# Patient Record
Sex: Male | Born: 1950 | Race: White | Hispanic: No | Marital: Married | Smoking: Former smoker
Health system: Southern US, Community
[De-identification: ages and names within clinical notes are randomized; demographics above are authoritative.]

## PROBLEM LIST (undated history)

## (undated) DIAGNOSIS — B019 Varicella without complication: Secondary | ICD-10-CM

## (undated) DIAGNOSIS — H269 Unspecified cataract: Secondary | ICD-10-CM

## (undated) DIAGNOSIS — R7303 Prediabetes: Secondary | ICD-10-CM

## (undated) DIAGNOSIS — I739 Peripheral vascular disease, unspecified: Secondary | ICD-10-CM

## (undated) DIAGNOSIS — K219 Gastro-esophageal reflux disease without esophagitis: Secondary | ICD-10-CM

## (undated) DIAGNOSIS — R569 Unspecified convulsions: Secondary | ICD-10-CM

## (undated) DIAGNOSIS — F32A Depression, unspecified: Secondary | ICD-10-CM

## (undated) DIAGNOSIS — E785 Hyperlipidemia, unspecified: Secondary | ICD-10-CM

## (undated) HISTORY — DX: Unspecified convulsions: R56.9

## (undated) HISTORY — DX: Varicella without complication: B01.9

## (undated) HISTORY — PX: TONSILLECTOMY: SUR1361

## (undated) HISTORY — DX: Hyperlipidemia, unspecified: E78.5

## (undated) HISTORY — DX: Depression, unspecified: F32.A

## (undated) HISTORY — PX: BACK SURGERY: SHX140

## (undated) HISTORY — DX: Unspecified cataract: H26.9

## (undated) HISTORY — DX: Gastro-esophageal reflux disease without esophagitis: K21.9

---

## 1985-07-23 HISTORY — PX: LAMINECTOMY: SHX219

## 2014-11-16 ENCOUNTER — Encounter: Payer: Self-pay | Admitting: Primary Care

## 2014-11-16 ENCOUNTER — Ambulatory Visit (INDEPENDENT_AMBULATORY_CARE_PROVIDER_SITE_OTHER): Payer: Medicare Other | Admitting: Primary Care

## 2014-11-16 VITALS — BP 138/82 | HR 68 | Temp 98.3°F | Ht 74.75 in | Wt 199.8 lb

## 2014-11-16 DIAGNOSIS — L989 Disorder of the skin and subcutaneous tissue, unspecified: Secondary | ICD-10-CM | POA: Insufficient documentation

## 2014-11-16 DIAGNOSIS — K219 Gastro-esophageal reflux disease without esophagitis: Secondary | ICD-10-CM | POA: Diagnosis not present

## 2014-11-16 DIAGNOSIS — Z720 Tobacco use: Secondary | ICD-10-CM | POA: Insufficient documentation

## 2014-11-16 DIAGNOSIS — F17210 Nicotine dependence, cigarettes, uncomplicated: Secondary | ICD-10-CM

## 2014-11-16 HISTORY — DX: Disorder of the skin and subcutaneous tissue, unspecified: L98.9

## 2014-11-16 MED ORDER — CEPHALEXIN 500 MG PO CAPS: 500.0000 mg | ORAL_CAPSULE | Freq: Two times a day (BID) | ORAL | Status: DC

## 2014-11-16 NOTE — Assessment & Plan Note (Addendum)
Growth present to right parietal portion of head for 2 months. Suspicious for carcinoma, works outside in yard, long standing history of smoking. RX for Keflex BID for weeping wound. Referral made to dermatology.

## 2014-11-16 NOTE — Patient Instructions (Addendum)
Start Cephalexin antibiotic for the skin irritation present to the top of your head. Take one capsule twice daily for 7 days. Try to work on increasing the amount of fruits and vegetables in your diet. It's acceptable to continue taking Prilosec as needed with your Nexium for reflux symptoms. You will be contacted regarding your referral to dermatology. Let me know if you don't hear back in one week. Please schedule a physical with me in the next 2-3 months. You will also make a lab appointment one week prior. It was a pleasure to meet you today! Please don't hesitate to call me with any questions. Welcome to [facility]!    Food Choices for Gastroesophageal Reflux Disease When you have gastroesophageal reflux disease (GERD), the foods you eat and your eating habits are very important. Choosing the right foods can help ease the discomfort of GERD. WHAT GENERAL GUIDELINES DO I NEED TO FOLLOW?  Choose fruits, vegetables, whole grains, low-fat dairy products, and low-fat meat, fish, and poultry.  Limit fats such as oils, salad dressings, butter, nuts, and avocado.  Keep a food diary to identify foods that cause symptoms.  Avoid foods that cause reflux. These may be different for different people.  Eat frequent small meals instead of three large meals each day.  Eat your meals slowly, in a relaxed setting.  Limit fried foods.  Cook foods using methods other than frying.  Avoid drinking alcohol.  Avoid drinking large amounts of liquids with your meals.  Avoid bending over or lying down until 2-3 hours after eating. WHAT FOODS ARE NOT RECOMMENDED? The following are some foods and drinks that may worsen your symptoms: Vegetables Tomatoes. Tomato juice. Tomato and spaghetti sauce. Chili peppers. Onion and garlic. Horseradish. Fruits Oranges, grapefruit, and lemon (fruit and juice). Meats High-fat meats, fish, and poultry. This includes hot dogs, ribs, ham, sausage, salami, and  bacon. Dairy Whole milk and chocolate milk. Sour cream. Cream. Butter. Ice cream. Cream cheese.  Beverages Coffee and tea, with or without caffeine. Carbonated beverages or energy drinks. Condiments Hot sauce. Barbecue sauce.  Sweets/Desserts Chocolate and cocoa. Donuts. Peppermint and spearmint. Fats and Oils High-fat foods, including [place] fries and potato chips. Other Vinegar. Strong spices, such as black pepper, white pepper, red pepper, cayenne, curry powder, cloves, ginger, and chili powder. The items listed above may not be a complete list of foods and beverages to avoid. Contact your dietitian for more information. Document Released: 07/09/2005 Document Revised: 07/14/2013 Document Reviewed: 05/13/2013 [facility] Patient Information 2015 [place], [place]. This information is not intended to replace advice given to you by your health care provider. Make sure you discuss any questions you have with your health care provider.

## 2014-11-16 NOTE — Progress Notes (Signed)
Pre visit review using our clinic review tool, if applicable. No additional management support is needed unless otherwise documented below in the visit note. 

## 2014-11-16 NOTE — Assessment & Plan Note (Signed)
Smoker for 54 years, 1 PPD. He is not interested in quitting. Daily morning cough present, denies hemoptysis. Occasional SOB. Will continue to re-evaluate.

## 2014-11-16 NOTE — Progress Notes (Signed)
Subjective:    Patient ID: Nathaniel [name], male    DOB: October 27, 1950, 64 y.o.   MRN: [id number]  HPI  Nathaniel [name] is a 64 year old male who presents today to establish care and discuss the problems mentioned below. He's not seen a PCP in over 20 years; therefore does not have recollection of current records.  1) GERD: Symptoms of reflux have been present intermittently for about 12-15 years, and consistently for the past 6-8 months. If he doesn't have a bowel movement prior to bed then he will experience reflux of gastric contents. He's been taking Nexium 20 mg OTC for about one year. He will develop symptoms if eating spaghetti sauce and laying on his right side at night. His symptoms are mostly controlled on the Nexium and will occasionally need to take Prilosec.   2) Tobacco abuse: Cigarette smoker for 54 years, 1 PPD. He enjoys smoking and does not want to quit. He has quit 12-20 times over the years. He does report coughing every morning, denies hemoptysis.  3) Lifestyle: Diet consists of red meat, bacon, sandwiches, very few vegetables and fruit. He and his wife do prepare most of their meals.  He drinks mostly sweet tea (3/4 gallon per day), 3/4 gallon of water per day, and 8-10 cups of black coffee. He's not exercising, but stays active throughout the day.  4) Skin abnormality: New growth present to right parietal portion of head for 2 months. He admits that it has enlarged and is tender. Also dark brown mole present to right TMJ that has been there for 20+ years without changes.  Review of Systems  Constitutional: Negative for fatigue and unexpected weight change.  HENT: Negative for rhinorrhea.   Respiratory: Negative for wheezing.        Morning cough, occasional shortness of breath when walking quickly.  Cardiovascular: Negative for chest pain and leg swelling.  Gastrointestinal: Negative for diarrhea, constipation and blood in stool.  Genitourinary: Negative for dysuria,  frequency and difficulty urinating.  Musculoskeletal: Negative for myalgias and arthralgias.  Skin: Positive for color change and rash.       Started as crusty place, been there 2 months. Now enlarged, sensitive, occasionally itchy.  Allergic/Immunologic: Negative for environmental allergies.  Neurological: Negative for dizziness and headaches.  Hematological: Negative for adenopathy.  Psychiatric/Behavioral:       Denies concerns for anxiety or depression       Past Medical History  Diagnosis Date  . Chicken pox   . GERD (gastroesophageal reflux disease)   . Seizures     None since age 42    History   Social History  . Marital Status: Married    Spouse Name: N/A  . Number of Children: N/A  . Years of Education: N/A   Occupational History  . Not on file.   Social History Main Topics  . Smoking status: Current Every Day Smoker -- 1.00 packs/day    Types: Cigarettes  . Smokeless tobacco: Not on file  . Alcohol Use: No  . Drug Use: No  . Sexual Activity: Not on file   Other Topics Concern  . Not on file   Social History Narrative   Retired.   Stays busy with household work, Surveyor, mining.   Married for 3 years.   Grown children.   Enjoys reading.     Past Surgical History  Procedure Laterality Date  . Tonsillectomy      Family History  Problem Relation Age  of Onset  . Heart disease Mother     Arrthymia   . Stroke Father     Deceased  . Heart disease Brother     Myocardial infarction  . Dementia Father     Allergies  Allergen Reactions  . Prozac [Fluoxetine Hcl]     No current outpatient prescriptions on file prior to visit.   No current facility-administered medications on file prior to visit.    BP 138/82 mmHg  Pulse 68  Temp(Src) 98.3 F (36.8 C) (Oral)  Ht 6' 2.75" (1.899 m)  Wt 199 lb 12.8 oz (90.629 kg)  BMI 25.13 kg/m2  SpO2 98%    Objective:   Physical Exam  Constitutional: He is oriented to person, place, and time. He  appears well-developed.  HENT:  Right Ear: Tympanic membrane and ear canal normal.  Left Ear: Tympanic membrane and ear canal normal.  Nose: Nose normal.  Mouth/Throat: Oropharynx is clear and moist.  Eyes: Conjunctivae and EOM are normal.  Neck: Neck supple. No thyromegaly present.  Cardiovascular: Normal rate and regular rhythm.   Pulmonary/Chest: Effort normal and breath sounds normal.  Abdominal: Soft. Bowel sounds are normal. There is no tenderness.  Lymphadenopathy:    He has no cervical adenopathy.  Neurological: He is alert and oriented to person, place, and time. No cranial nerve deficit.  Skin: Skin is warm. Lesion noted. There is erythema.  1.5 cm growth present to right parietal side of cranium. Slight edema present, suspicious for carcinoma.   1.5 cm dark mole present to right TMJ.   Psychiatric: He has a normal mood and affect.          Assessment & Plan:  Tobacco cessation counseling provided to patient. Spent 5 min discussing the importance of quitting. He does not plan on quitting.

## 2014-11-16 NOTE — Assessment & Plan Note (Addendum)
Symptoms present for the past 12-15 years.  On OTC Nexium for less than one year with relief. Occasional prilosec. Handout and education provided to patient regarding triggers of GERD.

## 2014-12-14 ENCOUNTER — Encounter: Payer: Self-pay | Admitting: Primary Care

## 2014-12-14 ENCOUNTER — Encounter: Payer: Self-pay | Admitting: *Deleted

## 2014-12-14 ENCOUNTER — Ambulatory Visit (INDEPENDENT_AMBULATORY_CARE_PROVIDER_SITE_OTHER): Payer: Medicare Other | Admitting: Primary Care

## 2014-12-14 VITALS — BP 152/98 | HR 97 | Temp 98.2°F | Ht 74.5 in | Wt 200.1 lb

## 2014-12-14 DIAGNOSIS — R251 Tremor, unspecified: Secondary | ICD-10-CM

## 2014-12-14 DIAGNOSIS — M62838 Other muscle spasm: Secondary | ICD-10-CM

## 2014-12-14 DIAGNOSIS — Z79899 Other long term (current) drug therapy: Secondary | ICD-10-CM | POA: Diagnosis not present

## 2014-12-14 MED ORDER — CLONAZEPAM 0.5 MG PO TABS: ORAL_TABLET | ORAL | Status: DC

## 2014-12-14 MED ORDER — TIZANIDINE HCL 2 MG PO TABS: ORAL_TABLET | ORAL | Status: DC

## 2014-12-14 NOTE — Progress Notes (Signed)
Subjective:    Patient ID: Nathaniel [name], male    DOB: [birth year], 64 y.o.   MRN: [id number]  HPI  Mr. Mcgibbon is a 63 year old make who presents today with a chief complaint of shoulder and neck pain. He's been experiencing right shoulder and neck pain for 3 weeks since applying force to his right shoulder while plugging and changing a tire.  His pain will occasionally radiate down the back of his shoulder. He's been taking aleve once daily for pain with some relief. He describes his pain as a "deep burise" that feels like "tightness" which is worse upon movement and better with rest/laying down. Denies sharp, shooting pain down his arms or back.  2) Tremors: Present to bilateral hands with movement (not rest) and have been present since 1989. Once managed on Clonazepam 0.5 and would take 1/2 tablet once daily for 3 days. He reports this would control the tremor for 2-3 weeks and then he would restart the regimen. Denies shuffling gait, loss of balance, resting tremor. The tremor is mostly noted with fine motor usage.  Review of Systems  Constitutional: Negative for fever and chills.  Respiratory: Negative for shortness of breath.   Cardiovascular: Negative for chest pain.  Musculoskeletal: Positive for myalgias.       ROM worsens pain. Also reports tremor to bilateral hands with motion.       Past Medical History  Diagnosis Date  . Chicken pox   . GERD (gastroesophageal reflux disease)   . Seizures     None since age 36    History   Social History  . Marital Status: Married    Spouse Name: N/A  . Number of Children: N/A  . Years of Education: N/A   Occupational History  . Not on file.   Social History Main Topics  . Smoking status: Current Every Day Smoker -- 1.00 packs/day    Types: Cigarettes  . Smokeless tobacco: Not on file  . Alcohol Use: No  . Drug Use: No  . Sexual Activity: Not on file   Other Topics Concern  . Not on file   Social History  Narrative   Retired.   Stays busy with household work, Surveyor, mining.   Married for 3 years.   Grown children.   Enjoys reading.     Past Surgical History  Procedure Laterality Date  . Tonsillectomy      Family History  Problem Relation Age of Onset  . Heart disease Mother     Arrthymia   . Stroke Father     Deceased  . Heart disease Brother     Myocardial infarction  . Dementia Father     Allergies  Allergen Reactions  . Prozac [Fluoxetine Hcl]     Current Outpatient Prescriptions on File Prior to Visit  Medication Sig Dispense Refill  . cephALEXin (KEFLEX) 500 MG capsule Take 1 capsule (500 mg total) by mouth 2 (two) times daily. 14 capsule 0  . esomeprazole (NEXIUM) 20 MG capsule Take 20 mg by mouth daily at 12 noon.    Marland Kitchen ibuprofen (ADVIL,MOTRIN) 200 MG tablet Take 200 mg by mouth as needed.     No current facility-administered medications on file prior to visit.    BP 152/98 mmHg  Pulse 97  Temp(Src) 98.2 F (36.8 C) (Oral)  Ht 6' 2.5" (1.892 m)  Wt 200 lb 1.9 oz (90.774 kg)  BMI 25.36 kg/m2  SpO2 98%    Objective:  Physical Exam  Cardiovascular: Normal rate and regular rhythm.   Pulmonary/Chest: Effort normal and breath sounds normal.  Musculoskeletal:       Right shoulder: He exhibits decreased range of motion, tenderness and pain. He exhibits no swelling and no deformity.  Decreased ROM and pain with Abduction and placing arm behind back. No resting tremor noted on exam, but mostly occurs with fine motor movements per patient.  Skin: Skin is warm and dry.          Assessment & Plan:  Shoulder pain:  Present since plugging tire 3 weeks ago after forcing his right shoulder. Decreased ROM and pain to right shoulder with abduction and placing behind back. Some tenderness upon palpation to trapezius muscle. RX for Tizanidine TID PRN with drowsiness precautions. If no improvement then will consider xrays and PT.

## 2014-12-14 NOTE — Assessment & Plan Note (Signed)
Present since 1989. Once managed on Clonazepam 0.5 mg and took 1/2 tab for three days which would help reduce tremor for 2-3 weeks. RX prescribed for same dosage with same instructions. Controlled substance contract and UDS obtained. Will continue to monitor.

## 2014-12-14 NOTE — Patient Instructions (Signed)
Start Tizanidine tablets for muscle spasms to neck, back, and shoulder. You may take 1 tablet by mouth three times daily as needed for spasms. This medication may make you drowsy. Start Clonazepam as discussed for tremors. Complete controlled substance contract and urine drug screen prior to leaving. Apply heat to areas of pain. Rest until your trip. Follow up as needed.  Muscle Cramps and Spasms Muscle cramps and spasms occur when a muscle or muscles tighten and you have no control over this tightening (involuntary muscle contraction). They are a common problem and can develop in any muscle. The most common place is in the calf muscles of the leg. Both muscle cramps and muscle spasms are involuntary muscle contractions, but they also have differences:   Muscle cramps are sporadic and painful. They may last a few seconds to a quarter of an hour. Muscle cramps are often more forceful and last longer than muscle spasms.  Muscle spasms may or may not be painful. They may also last just a few seconds or much longer. CAUSES  It is uncommon for cramps or spasms to be due to a serious underlying problem. In many cases, the cause of cramps or spasms is unknown. Some common causes are:   Overexertion.   Overuse from repetitive motions (doing the same thing over and over).   Remaining in a certain position for a long period of time.   Improper preparation, form, or technique while performing a sport or activity.   Dehydration.   Injury.   Side effects of some medicines.   Abnormally low levels of the salts and ions in your blood (electrolytes), especially potassium and calcium. This could happen if you are taking water pills (diuretics) or you are pregnant.  Some underlying medical problems can make it more likely to develop cramps or spasms. These include, but are not limited to:   Diabetes.   Parkinson disease.   Hormone disorders, such as thyroid problems.   Alcohol abuse.    Diseases specific to muscles, joints, and bones.   Blood vessel disease where not enough blood is getting to the muscles.  HOME CARE INSTRUCTIONS   Stay well hydrated. Drink enough water and fluids to keep your urine clear or pale yellow.  It may be helpful to massage, stretch, and relax the affected muscle.  For tight or tense muscles, use a warm towel, heating pad, or hot shower water directed to the affected area.  If you are sore or have pain after a cramp or spasm, applying ice to the affected area may relieve discomfort.  Put ice in a plastic bag.  Place a towel between your skin and the bag.  Leave the ice on for 15-20 minutes, 03-04 times a day.  Medicines used to treat a known cause of cramps or spasms may help reduce their frequency or severity. Only take over-the-counter or prescription medicines as directed by your caregiver. SEEK MEDICAL CARE IF:  Your cramps or spasms get more severe, more frequent, or do not improve over time.  MAKE SURE YOU:   Understand these instructions.  Will watch your condition.  Will get help right away if you are not doing well or get worse. Document Released: 12/29/2001 Document Revised: 11/03/2012 Document Reviewed: 06/25/2012 [facility] Patient Information 2015 [place], [place]. This information is not intended to replace advice given to you by your health care provider. Make sure you discuss any questions you have with your health care provider.

## 2014-12-14 NOTE — Progress Notes (Signed)
Pre visit review using our clinic review tool, if applicable. No additional management support is needed unless otherwise documented below in the visit note. 

## 2014-12-16 DIAGNOSIS — D485 Neoplasm of uncertain behavior of skin: Secondary | ICD-10-CM | POA: Diagnosis not present

## 2014-12-16 DIAGNOSIS — L821 Other seborrheic keratosis: Secondary | ICD-10-CM | POA: Diagnosis not present

## 2014-12-16 DIAGNOSIS — C4441 Basal cell carcinoma of skin of scalp and neck: Secondary | ICD-10-CM | POA: Diagnosis not present

## 2014-12-16 DIAGNOSIS — D034 Melanoma in situ of scalp and neck: Secondary | ICD-10-CM | POA: Diagnosis not present

## 2015-01-03 ENCOUNTER — Other Ambulatory Visit: Payer: Self-pay | Admitting: Primary Care

## 2015-01-03 DIAGNOSIS — Z13220 Encounter for screening for lipoid disorders: Secondary | ICD-10-CM

## 2015-01-03 DIAGNOSIS — Z720 Tobacco use: Secondary | ICD-10-CM

## 2015-01-03 DIAGNOSIS — Z130 Encounter for screening for diseases of the blood and blood-forming organs and certain disorders involving the immune mechanism: Secondary | ICD-10-CM

## 2015-01-03 DIAGNOSIS — Z131 Encounter for screening for diabetes mellitus: Secondary | ICD-10-CM

## 2015-01-03 DIAGNOSIS — Z0000 Encounter for general adult medical examination without abnormal findings: Secondary | ICD-10-CM

## 2015-01-05 ENCOUNTER — Other Ambulatory Visit: Payer: Self-pay | Admitting: Primary Care

## 2015-01-05 DIAGNOSIS — Z136 Encounter for screening for cardiovascular disorders: Secondary | ICD-10-CM

## 2015-01-05 DIAGNOSIS — Z13220 Encounter for screening for lipoid disorders: Secondary | ICD-10-CM

## 2015-01-05 DIAGNOSIS — Z0000 Encounter for general adult medical examination without abnormal findings: Secondary | ICD-10-CM

## 2015-01-05 DIAGNOSIS — R030 Elevated blood-pressure reading, without diagnosis of hypertension: Secondary | ICD-10-CM

## 2015-01-05 DIAGNOSIS — Z720 Tobacco use: Secondary | ICD-10-CM

## 2015-01-05 DIAGNOSIS — Z125 Encounter for screening for malignant neoplasm of prostate: Secondary | ICD-10-CM

## 2015-01-05 DIAGNOSIS — K219 Gastro-esophageal reflux disease without esophagitis: Secondary | ICD-10-CM

## 2015-01-10 ENCOUNTER — Other Ambulatory Visit (INDEPENDENT_AMBULATORY_CARE_PROVIDER_SITE_OTHER): Payer: Medicare Other

## 2015-01-10 DIAGNOSIS — Z136 Encounter for screening for cardiovascular disorders: Secondary | ICD-10-CM

## 2015-01-10 DIAGNOSIS — K219 Gastro-esophageal reflux disease without esophagitis: Secondary | ICD-10-CM | POA: Diagnosis not present

## 2015-01-10 DIAGNOSIS — Z125 Encounter for screening for malignant neoplasm of prostate: Secondary | ICD-10-CM | POA: Diagnosis not present

## 2015-01-10 LAB — COMPREHENSIVE METABOLIC PANEL
ALBUMIN: 3.9 g/dL (ref 3.5–5.2)
ALT: 13 U/L (ref 0–53)
AST: 15 U/L (ref 0–37)
Alkaline Phosphatase: 93 U/L (ref 39–117)
BILIRUBIN TOTAL: 0.5 mg/dL (ref 0.2–1.2)
BUN: 10 mg/dL (ref 6–23)
CHLORIDE: 102 meq/L (ref 96–112)
CO2: 30 meq/L (ref 19–32)
Calcium: 9.4 mg/dL (ref 8.4–10.5)
Creatinine, Ser: 1.09 mg/dL (ref 0.40–1.50)
GFR: 71.95 mL/min (ref >60.00)
Glucose, Bld: 93 mg/dL (ref 70–99)
POTASSIUM: 4 meq/L (ref 3.5–5.1)
SODIUM: 137 meq/L (ref 135–145)
TOTAL PROTEIN: 6.2 g/dL (ref 6.0–8.3)

## 2015-01-10 LAB — LIPID PANEL
CHOL/HDL RATIO: 5
Cholesterol: 199 mg/dL (ref 0–200)
HDL: 36.4 mg/dL — AB (ref >39.00)
LDL Cholesterol: 136 mg/dL — ABNORMAL HIGH (ref 0–99)
NONHDL: 162.6
Triglycerides: 133 mg/dL (ref 0.0–149.0)
VLDL: 26.6 mg/dL (ref 0.0–40.0)

## 2015-01-10 LAB — CBC
HCT: 42.3 % (ref 39.0–52.0)
Hemoglobin: 14.2 g/dL (ref 13.0–17.0)
MCHC: 33.5 g/dL (ref 30.0–36.0)
MCV: 92.7 fl (ref 78.0–100.0)
PLATELETS: 237 10*3/uL (ref 150.0–400.0)
RBC: 4.57 Mil/uL (ref 4.22–5.81)
RDW: 13.8 % (ref 11.5–15.5)
WBC: 10.2 10*3/uL (ref 4.0–10.5)

## 2015-01-10 LAB — PSA, MEDICARE: PSA: 0.99 ng/ml (ref 0.10–4.00)

## 2015-01-17 ENCOUNTER — Encounter: Payer: Self-pay | Admitting: Primary Care

## 2015-01-17 ENCOUNTER — Ambulatory Visit (INDEPENDENT_AMBULATORY_CARE_PROVIDER_SITE_OTHER): Payer: Medicare Other | Admitting: Primary Care

## 2015-01-17 ENCOUNTER — Telehealth: Payer: Self-pay | Admitting: Acute Care

## 2015-01-17 ENCOUNTER — Encounter: Payer: Self-pay | Admitting: Internal Medicine

## 2015-01-17 VITALS — BP 138/70 | HR 72 | Temp 97.2°F | Ht 75.0 in | Wt 198.4 lb

## 2015-01-17 DIAGNOSIS — Z720 Tobacco use: Secondary | ICD-10-CM | POA: Diagnosis not present

## 2015-01-17 DIAGNOSIS — Z0000 Encounter for general adult medical examination without abnormal findings: Secondary | ICD-10-CM | POA: Diagnosis not present

## 2015-01-17 DIAGNOSIS — Z1211 Encounter for screening for malignant neoplasm of colon: Secondary | ICD-10-CM | POA: Diagnosis not present

## 2015-01-17 DIAGNOSIS — R5383 Other fatigue: Secondary | ICD-10-CM

## 2015-01-17 DIAGNOSIS — R531 Weakness: Secondary | ICD-10-CM

## 2015-01-17 DIAGNOSIS — R0989 Other specified symptoms and signs involving the circulatory and respiratory systems: Secondary | ICD-10-CM | POA: Diagnosis not present

## 2015-01-17 DIAGNOSIS — L989 Disorder of the skin and subcutaneous tissue, unspecified: Secondary | ICD-10-CM

## 2015-01-17 NOTE — Assessment & Plan Note (Signed)
Does not want to quit. Low dose CT scan ordered for lung cancer screening.

## 2015-01-17 NOTE — Patient Instructions (Addendum)
Stop by the front desk and speak with Nathaniel [name] regarding:  -Carotid artery dopplers. -Colonoscopy, screening for colon cancer -Low dose CT scan, screening for lung cancer  I will notify you once I receive the results of each test.  Work to cut back on fried/fatty foods, red meat.  Start taking daily aspirin 81 mg.  It was nice to see you! Follow up in 6 months for re-evaluation.  Fat and Cholesterol Control Diet Fat and cholesterol levels in your blood and organs are influenced by your diet. High levels of fat and cholesterol may lead to diseases of the heart, small and large blood vessels, gallbladder, liver, and pancreas. CONTROLLING FAT AND CHOLESTEROL WITH DIET Although exercise and lifestyle factors are important, your diet is key. That is because certain foods are known to raise cholesterol and others to lower it. The goal is to balance foods for their effect on cholesterol and more importantly, to replace saturated and trans fat with other types of fat, such as monounsaturated fat, polyunsaturated fat, and omega-3 fatty acids. On average, a person should consume no more than 15 to 17 g of saturated fat daily. Saturated and trans fats are considered "bad" fats, and they will raise LDL cholesterol. Saturated fats are primarily found in animal products such as meats, butter, and cream. However, that does not mean you need to give up all your favorite foods. Today, there are good tasting, low-fat, low-cholesterol substitutes for most of the things you like to eat. Choose low-fat or nonfat alternatives. Choose round or loin cuts of red meat. These types of cuts are lowest in fat and cholesterol. Chicken (without the skin), fish, veal, and ground [place] breast are great choices. Eliminate fatty meats, such as hot dogs and salami. Even shellfish have little or no saturated fat. Have a 3 oz (85 g) portion when you eat lean meat, poultry, or fish. Trans fats are also called "partially hydrogenated  oils." They are oils that have been scientifically manipulated so that they are solid at room temperature resulting in a longer shelf life and improved taste and texture of foods in which they are added. Trans fats are found in stick margarine, some tub margarines, cookies, crackers, and baked goods.  When baking and cooking, oils are a great substitute for butter. The monounsaturated oils are especially beneficial since it is believed they lower LDL and raise HDL. The oils you should avoid entirely are saturated tropical oils, such as coconut and palm.  Remember to eat a lot from food groups that are naturally free of saturated and trans fat, including fish, fruit, vegetables, beans, grains (barley, rice, couscous, bulgur wheat), and pasta (without cream sauces).  IDENTIFYING FOODS THAT LOWER FAT AND CHOLESTEROL  Soluble fiber may lower your cholesterol. This type of fiber is found in fruits such as apples, vegetables such as broccoli, potatoes, and carrots, legumes such as beans, peas, and lentils, and grains such as barley. Foods fortified with plant sterols (phytosterol) may also lower cholesterol. You should eat at least 2 g per day of these foods for a cholesterol lowering effect.  Read package labels to identify low-saturated fats, trans fat free, and low-fat foods at the supermarket. Select cheeses that have only 2 to 3 g saturated fat per ounce. Use a heart-healthy tub margarine that is free of trans fats or partially hydrogenated oil. When buying baked goods (cookies, crackers), avoid partially hydrogenated oils. Breads and muffins should be made from whole grains (whole-wheat or whole oat flour,  instead of "flour" or "enriched flour"). Buy non-creamy canned soups with reduced salt and no added fats.  FOOD PREPARATION TECHNIQUES  Never deep-fry. If you must fry, either stir-fry, which uses very little fat, or use non-stick cooking sprays. When possible, broil, bake, or roast meats, and steam  vegetables. Instead of putting butter or margarine on vegetables, use lemon and herbs, applesauce, and cinnamon (for squash and sweet potatoes). Use nonfat yogurt, salsa, and low-fat dressings for salads.  LOW-SATURATED FAT / LOW-FAT FOOD SUBSTITUTES Meats / Saturated Fat (g)  Avoid: Steak, marbled (3 oz/85 g) / 11 g  Choose: Steak, lean (3 oz/85 g) / 4 g  Avoid: Hamburger (3 oz/85 g) / 7 g  Choose: Hamburger, lean (3 oz/85 g) / 5 g  Avoid: Ham (3 oz/85 g) / 6 g  Choose: Ham, lean cut (3 oz/85 g) / 2.4 g  Avoid: Chicken, with skin, dark meat (3 oz/85 g) / 4 g  Choose: Chicken, skin removed, dark meat (3 oz/85 g) / 2 g  Avoid: Chicken, with skin, light meat (3 oz/85 g) / 2.5 g  Choose: Chicken, skin removed, light meat (3 oz/85 g) / 1 g Dairy / Saturated Fat (g)  Avoid: Whole milk (1 cup) / 5 g  Choose: Low-fat milk, 2% (1 cup) / 3 g  Choose: Low-fat milk, 1% (1 cup) / 1.5 g  Choose: Skim milk (1 cup) / 0.3 g  Avoid: Hard cheese (1 oz/28 g) / 6 g  Choose: Skim milk cheese (1 oz/28 g) / 2 to 3 g  Avoid: Cottage cheese, 4% fat (1 cup) / 6.5 g  Choose: Low-fat cottage cheese, 1% fat (1 cup) / 1.5 g  Avoid: Ice cream (1 cup) / 9 g  Choose: Sherbet (1 cup) / 2.5 g  Choose: Nonfat frozen yogurt (1 cup) / 0.3 g  Choose: Frozen fruit bar / trace  Avoid: Whipped cream (1 tbs) / 3.5 g  Choose: Nondairy whipped topping (1 tbs) / 1 g Condiments / Saturated Fat (g)  Avoid: Mayonnaise (1 tbs) / 2 g  Choose: Low-fat mayonnaise (1 tbs) / 1 g  Avoid: Butter (1 tbs) / 7 g  Choose: Extra light margarine (1 tbs) / 1 g  Avoid: Coconut oil (1 tbs) / 11.8 g  Choose: Olive oil (1 tbs) / 1.8 g  Choose: Corn oil (1 tbs) / 1.7 g  Choose: Safflower oil (1 tbs) / 1.2 g  Choose: Sunflower oil (1 tbs) / 1.4 g  Choose: Soybean oil (1 tbs) / 2.4 g  Choose: Canola oil (1 tbs) / 1 g Document Released: 07/09/2005 Document Revised: 11/03/2012 Document Reviewed:  10/07/2013 [employer] Patient Information 2015 [place], [place]. This information is not intended to replace advice given to you by your health care provider. Make sure you discuss any questions you have with your health care provider.

## 2015-01-17 NOTE — Progress Notes (Signed)
Patient ID: Nathaniel [name], male   DOB: [birth year], 64 y.o.   MRN: [id number]  HPI: Nathaniel [name] is a 64 year old male who presents today for a [facility] to [employer] Visit.  Past Medical History  Diagnosis Date  . Chicken pox   . GERD (gastroesophageal reflux disease)   . Seizures     None since age 76    Current Outpatient Prescriptions  Medication Sig Dispense Refill  . clonazePAM (KLONOPIN) 0.5 MG tablet Take 1/2 tablet by mouth daily for three days as needed for tremors. 15 tablet 0  . esomeprazole (NEXIUM) 20 MG capsule Take 20 mg by mouth daily at 12 noon.    Marland Kitchen ibuprofen (ADVIL,MOTRIN) 200 MG tablet Take 200 mg by mouth as needed.     No current facility-administered medications for this visit.    Allergies  Allergen Reactions  . Prozac [Fluoxetine Hcl]     Family History  Problem Relation Age of Onset  . Heart disease Mother     Arrthymia   . Stroke Father     Deceased  . Heart disease Brother     Myocardial infarction  . Dementia Father     History   Social History  . Marital Status: Married    Spouse Name: N/A  . Number of Children: N/A  . Years of Education: N/A   Occupational History  . Not on file.   Social History Main Topics  . Smoking status: Current Every Day Smoker -- 1.00 packs/day    Types: Cigarettes  . Smokeless tobacco: Not on file  . Alcohol Use: No  . Drug Use: No  . Sexual Activity: Not on file   Other Topics Concern  . Not on file   Social History Narrative   Retired.   Stays busy with household work, Surveyor, mining.   Married for 3 years.   Grown children.   Enjoys reading.     Hospitiliaztions: None.  Health Maintenance:    Flu: Last one completed in 2011  Tetanus: Td completed in 2012  Pneumovax: Has never completed  Zostavax: Has never completed  Bone Density:  Colonoscopy: Never completed.   Eye Doctor: Completed 1.5 years ago. No changes in glasses prescription.  Dental Exam: Has not been in over  25 years.  PSA: Normal. Completed 12/2014   I have personally reviewed and have noted: 1. The patient's medical and social history 2. Their use of alcohol (one drink every 6 months to 1 year), tobacco (daily tobacco abuse, 2/8-3.1 PPD)  or illicit drugs 3. Their current medications and supplements 4. The patient's functional ability including ADL's, fall risks, home safety risks and hearing or visual  impairment. 5. Diet and physical activities 6. Evidence for depression or mood disorder  Subjective:   Review of Systems:   Constitutional: Denies fever, headache or abrupt weight changes.  HEENT: Denies eye pain, eye redness, ear pain, ringing in the ears, wax buildup, runny nose, bloody nose, or sore throat. Some throat congestion worse at night. Respiratory: Denies difficulty breathing, or sputum production.  Daily morning cough, occasional shortness of breath. Cardiovascular: Denies chest pain, chest tightness, palpitations or swelling in the hands or feet.  Gastrointestinal: Denies abdominal pain, bloating, constipation, diarrhea or blood in the stool.  GU: Denies urgency, frequency, pain with urination, burning sensation, blood in urine, odor or discharge. Musculoskeletal: Denies decrease in range of motion, difficulty with gait, muscle pain and swelling. Right shoulder pain with some ROM. Skin: Is  set up to see [facility] dermatology in mid July for basal cell carcinoma and melanoma.  Neurological: Denies dizziness, difficulty with memory, difficulty with speech or problems with balance and coordination.   1) Fatigue: Present for past several months. He denies coffee consumption after 12pm. Pain and stress will cause difficulty sleeping. He wakes up daily between 5:30am - 6:30am and will typically go to bed between 10:30pm and 12am. He wakes up feeling tired.. Denies snoring, waking up gasping for air.  He will run out of energy and nap about 3 days a week for about one hour.   No other  specific complaints in a complete review of systems (except as listed in HPI above).  Objective:  PE:   BP 138/70 mmHg  Pulse 72  Temp(Src) 97.2 F (36.2 C) (Oral)  Ht 6\' 3"  (1.905 m)  Wt 198 lb 6.4 oz (89.994 kg)  BMI 24.80 kg/m2  SpO2 96% Wt Readings from Last 3 Encounters:  01/17/15 198 lb 6.4 oz (89.994 kg)  12/14/14 200 lb 1.9 oz (90.774 kg)  11/16/14 199 lb 12.8 oz (90.629 kg)    General: Appears their stated age, well developed, well nourished in NAD. Skin: Warm, dry. Lesion present to top right side of pariatal lobe. Appointment scheduled with Dermatology [facility] in mid July. HEENT: Head: normal shape and size; Eyes: sclera white, no icterus, conjunctiva pink, PERRLA and EOMs intact; Ears: Tm's gray and intact, normal light reflex; Nose: mucosa pink and moist, septum midline; Throat/Mouth: Teeth present, mucosa pink and moist, no exudate, lesions or ulcerations noted.  Neck: Normal range of motion. Neck supple, trachea midline. No massses, lumps or thyromegaly present.  Cardiovascular: Normal rate and rhythm. S1,S2 noted.  No murmur, rubs or gallops noted. No JVD or BLE edema. Right sided carotid bruit noted. Pulmonary/Chest: Normal effort and positive vesicular breath sounds. No respiratory distress. No wheezes, rales or ronchi noted. Suspect COPD.  Abdomen: Soft and nontender. Normal bowel sounds, no bruits noted. No distention or masses noted. Liver, spleen and kidneys non palpable. Musculoskeletal: Normal range of motion. No signs of joint swelling. No difficulty with gait.  Neurological: Alert and oriented. Cranial nerves II-XII intact. Coordination normal. +DTRs bilaterally. Recall of 3 items, performs simple math equations, able to recognize features of clock. Psychiatric: Mood and affect normal. Behavior is normal. Judgment and thought content normal.   EKG: No T-wave inversion, ST elevation. NSR. Rate of 72.  BMET    Component Value Date/Time   NA 137 01/10/2015 1000    K 4.0 01/10/2015 1000   CL 102 01/10/2015 1000   CO2 30 01/10/2015 1000   GLUCOSE 93 01/10/2015 1000   BUN 10 01/10/2015 1000   CREATININE 1.09 01/10/2015 1000   CALCIUM 9.4 01/10/2015 1000    Lipid Panel     Component Value Date/Time   CHOL 199 01/10/2015 1000   TRIG 133.0 01/10/2015 1000   HDL 36.40* 01/10/2015 1000   CHOLHDL 5 01/10/2015 1000   VLDL 26.6 01/10/2015 1000   LDLCALC 136* 01/10/2015 1000    CBC    Component Value Date/Time   WBC 10.2 01/10/2015 1000   RBC 4.57 01/10/2015 1000   HGB 14.2 01/10/2015 1000   HCT 42.3 01/10/2015 1000   PLT 237.0 01/10/2015 1000   MCV 92.7 01/10/2015 1000   MCHC 33.5 01/10/2015 1000   RDW 13.8 01/10/2015 1000    Hgb A1C No results found for: HGBA1C    Assessment and Plan:   Medicare Annual  Wellness Visit:  Diet: Heart healthy or DM if diabetic Physical activity: Mostly active Depression/mood screen: Negative Hearing: Intact to whispered voice Visual acuity: Grossly normal, performs annual eye exam  ADLs: Capable Fall risk: None Home safety: Good Cognitive evaluation: Intact to orientation, naming, recall and repetition EOL planning: Adv directives, full code/ I agree. Would like paperwork.  Preventative Medicine:  Next appointment:  [facility]: Mid July. Follow up with PCP in 6 months.  Copy of personalized plan for preventative services provided to patient. He declines shingles and pneumonia vaccines today.

## 2015-01-17 NOTE — Assessment & Plan Note (Signed)
Confirmed basal cell carcinoma and melanoma per Dermatology. Following up with Derm in mid July.

## 2015-01-17 NOTE — Progress Notes (Signed)
Pre visit review using our clinic review tool, if applicable. No additional management support is needed unless otherwise documented below in the visit note. 

## 2015-01-17 NOTE — Telephone Encounter (Signed)
I left a message for [name] with my contact information asking him to call me back to schedule his screening.( referral from Despina Pole, NP). I will await his return call.

## 2015-01-17 NOTE — Assessment & Plan Note (Signed)
Labs mostly unremarkable. Slightly elevated LDL and low HDL. Discussed low cholesterol diet. Exam unremarkable. 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 Ordered colonoscopy and low dose CT for screening. Declines shingles and pneumonia vaccines. Does not intend to quit smoking. Follow up in 6 months for re-evaluation.

## 2015-01-18 ENCOUNTER — Other Ambulatory Visit: Payer: Self-pay | Admitting: Acute Care

## 2015-01-18 DIAGNOSIS — Z87891 Personal history of nicotine dependence: Secondary | ICD-10-CM

## 2015-01-19 ENCOUNTER — Ambulatory Visit (INDEPENDENT_AMBULATORY_CARE_PROVIDER_SITE_OTHER): Payer: Medicare Other | Admitting: Acute Care

## 2015-01-19 ENCOUNTER — Telehealth: Payer: Self-pay | Admitting: Acute Care

## 2015-01-19 ENCOUNTER — Encounter: Payer: Self-pay | Admitting: Acute Care

## 2015-01-19 ENCOUNTER — Ambulatory Visit (INDEPENDENT_AMBULATORY_CARE_PROVIDER_SITE_OTHER)
Admission: RE | Admit: 2015-01-19 | Discharge: 2015-01-19 | Disposition: A | Discharge status: Home / Self Care | Payer: Medicare Other | Source: Ambulatory Visit | Attending: Acute Care | Admitting: Acute Care

## 2015-01-19 DIAGNOSIS — Z87891 Personal history of nicotine dependence: Secondary | ICD-10-CM | POA: Diagnosis not present

## 2015-01-19 NOTE — Progress Notes (Signed)
Shared Decision Making Visit Lung Cancer Screening Program [phone])   Eligibility:  Age 64 y.o.  Pack Years Smoking History Calculation 50 (# packs/per year x # years smoked)  Recent History of coughing up blood  no  Unexplained weight loss? no ( >Than 15 pounds within the last 6 months )  Prior History Lung / other cancer no (Diagnosis within the last 5 years already requiring surveillance chest CT Scans).  Smoking Status Current Smoker  Former Smokers: Years since quit:NA  Quit Date: NA  Visit Components:  Discussion included one or more decision making aids. yes  Discussion included risk/benefits of screening. yes  Discussion included potential follow up diagnostic testing for abnormal scans. yes  Discussion included meaning and risk of over diagnosis. yes  Discussion included meaning and risk of False Positives. yes  Discussion included meaning of total radiation exposure. yes  Counseling Included:  Importance of adherence to annual lung cancer LDCT screening. yes  Impact of comorbidities on ability to participate in the program. yes  Ability and willingness to under diagnostic treatment. yes  Smoking Cessation Counseling:  Current Smokers:   Discussed importance of smoking cessation. yes  Information about tobacco cessation classes and interventions provided to patient. yes  Patient provided with "ticket" for LDCT Scan. yes  Symptomatic Patient. no  Counseling:NA  Diagnosis Code: Tobacco Use Z72.0  Asymptomatic Patient yes  Counseling (Intermediate counseling: > three minutes counseling) T6226  Former Smokers:   Discussed the importance of maintaining cigarette abstinence. NA  Diagnosis Code: Personal History of Nicotine Dependence. J33.545  Information about tobacco cessation classes and interventions provided to patient. NA  Patient provided with "ticket" for LDCT Scan. yes  Written Order for Lung Cancer Screening with LDCT placed in  Epic. Yes (CT Chest Lung Cancer Screening Low Dose W/O CM) [id number] Z12.2-Screening of respiratory organs Z87.891-Personal history of nicotine dependence  I spent 20 minutes of face to face time explaining the risks and benefits of the lung cancer screening program to Nathaniel [name]. We viewed a power point together, stopping at intervals for questions and discussion, that discussed the above highlighted points. We discussed that the single most powerful thing he could do to decrease his risk of lung cancer was to stop smoking. He is not ready to do this at this time. I have given him the " Be stronger than your excuses card " and told him when he is ready to quit I will help him in any way that I can. He verbalized understanding and has my contact information. We discussed that this is a yearly scan, he verbalized understanding. Nathaniel [name] knows the time and location of his scan, and that I will call him the results as soon as I have them. I have sent him home with a copy of the power point we viewed together in the event he has any further questions. He has my contact information.   Magdalen Spatz, NP

## 2015-01-19 NOTE — Telephone Encounter (Signed)
I called to give Nathaniel [name] his low dose CT scan results. He was sleeping and had ok'd his wife, who is a retired Therapist, sports, getting the report. I explained to her that the scan was graded a Lung RADS 2, nodules with a very low likelihood of becoming clinically active cancer due to size or lack of growth.I explained that the recommendation was for  a repeat scan in 12 months as part of his annual health maintenance. She verbalized understanding of both the results and when the repeat scan was due.I also told her that if he develops any unexplained weight loss, or started coughing up blood to give [place] a call and we would schedule him sooner. She verbalized understanding. Nathaniel [name] has my contact information should he have any further questions.

## 2015-01-25 ENCOUNTER — Ambulatory Visit (INDEPENDENT_AMBULATORY_CARE_PROVIDER_SITE_OTHER): Payer: Medicare Other

## 2015-01-25 DIAGNOSIS — R0989 Other specified symptoms and signs involving the circulatory and respiratory systems: Secondary | ICD-10-CM

## 2015-02-01 ENCOUNTER — Telehealth: Payer: Self-pay | Admitting: Acute Care

## 2015-02-01 NOTE — Telephone Encounter (Signed)
Nathaniel [name] called with questions about the results of his scan that I called to him 01/19/15. I went over the lung RADS 2 results again,and explained that there were nodules but that they were benign in appearance and behavior.I reminded him again that he will need a repeat scan in 12 months, but that if he had any changes in health, specifically coughing up blood or unexplained weight loss, he needed to contact his pcp or [place] for follow up sooner.I told him I would call in 12 months to schedule his scan. He verbalized understanding.

## 2015-02-02 ENCOUNTER — Other Ambulatory Visit: Payer: Self-pay | Admitting: Primary Care

## 2015-02-02 DIAGNOSIS — E785 Hyperlipidemia, unspecified: Secondary | ICD-10-CM

## 2015-02-02 MED ORDER — SIMVASTATIN 40 MG PO TABS: 40.0000 mg | ORAL_TABLET | Freq: Every day | ORAL | Status: DC

## 2015-02-07 DIAGNOSIS — L578 Other skin changes due to chronic exposure to nonionizing radiation: Secondary | ICD-10-CM | POA: Diagnosis not present

## 2015-02-07 DIAGNOSIS — D034 Melanoma in situ of scalp and neck: Secondary | ICD-10-CM | POA: Diagnosis not present

## 2015-02-07 DIAGNOSIS — L814 Other melanin hyperpigmentation: Secondary | ICD-10-CM | POA: Diagnosis not present

## 2015-02-07 DIAGNOSIS — L908 Other atrophic disorders of skin: Secondary | ICD-10-CM | POA: Diagnosis not present

## 2015-02-07 DIAGNOSIS — C4441 Basal cell carcinoma of skin of scalp and neck: Secondary | ICD-10-CM | POA: Diagnosis not present

## 2015-03-09 ENCOUNTER — Ambulatory Visit (AMBULATORY_SURGERY_CENTER): Payer: Self-pay | Admitting: *Deleted

## 2015-03-09 VITALS — Ht 76.0 in | Wt 198.0 lb

## 2015-03-09 DIAGNOSIS — Z1211 Encounter for screening for malignant neoplasm of colon: Secondary | ICD-10-CM

## 2015-03-09 MED ORDER — NA SULFATE-K SULFATE-MG SULF 17.5-3.13-1.6 GM/177ML PO SOLN: ORAL | Status: DC

## 2015-03-09 NOTE — Progress Notes (Signed)
Patient denies any allergies to eggs or soy. Patient denies any problems with anesthesia/sedation. Patient states hard to wake up from anesthesia. Patient denies any oxygen use at home and does not take any diet/weight loss medications. EMMI education assisgned to patient on colonoscopy, this was explained and instructions given to patient.

## 2015-03-23 ENCOUNTER — Ambulatory Visit (AMBULATORY_SURGERY_CENTER): Payer: Medicare Other | Admitting: Internal Medicine

## 2015-03-23 ENCOUNTER — Encounter: Payer: Self-pay | Admitting: Internal Medicine

## 2015-03-23 VITALS — BP 119/70 | HR 94 | Temp 97.6°F | Resp 26 | Ht 76.0 in | Wt 198.0 lb

## 2015-03-23 DIAGNOSIS — K219 Gastro-esophageal reflux disease without esophagitis: Secondary | ICD-10-CM | POA: Diagnosis not present

## 2015-03-23 DIAGNOSIS — D125 Benign neoplasm of sigmoid colon: Secondary | ICD-10-CM

## 2015-03-23 DIAGNOSIS — D127 Benign neoplasm of rectosigmoid junction: Secondary | ICD-10-CM | POA: Diagnosis not present

## 2015-03-23 DIAGNOSIS — D128 Benign neoplasm of rectum: Secondary | ICD-10-CM

## 2015-03-23 DIAGNOSIS — Z1211 Encounter for screening for malignant neoplasm of colon: Secondary | ICD-10-CM | POA: Diagnosis not present

## 2015-03-23 DIAGNOSIS — D123 Benign neoplasm of transverse colon: Secondary | ICD-10-CM | POA: Diagnosis not present

## 2015-03-23 DIAGNOSIS — I6529 Occlusion and stenosis of unspecified carotid artery: Secondary | ICD-10-CM | POA: Diagnosis not present

## 2015-03-23 DIAGNOSIS — D122 Benign neoplasm of ascending colon: Secondary | ICD-10-CM | POA: Diagnosis not present

## 2015-03-23 DIAGNOSIS — D129 Benign neoplasm of anus and anal canal: Secondary | ICD-10-CM

## 2015-03-23 MED ORDER — SODIUM CHLORIDE 0.9 % IV SOLN: 500.0000 mL | INTRAVENOUS | Status: DC

## 2015-03-23 NOTE — Progress Notes (Signed)
Report to PACU, RN, vss, BBS= Clear.  

## 2015-03-23 NOTE — Progress Notes (Signed)
Called to room to assist during endoscopic procedure.  Patient ID and intended procedure confirmed with present staff. Received instructions for my participation in the procedure from the performing physician.  

## 2015-03-23 NOTE — Op Note (Signed)
[place]  [address] [place], [id number]   COLONOSCOPY PROCEDURE REPORT  PATIENT: Nathaniel [name], Nathaniel [name]  MR#: [id number] BIRTHDATE: Nov 27, 1950 , 58  yrs. old GENDER: male ENDOSCOPIST: Jerene Bears, MD REFERRED BY: Alma Friendly, NP PROCEDURE DATE:  03/23/2015 PROCEDURE:   Colonoscopy, screening and Colonoscopy with cold biopsy polypectomy First Screening Colonoscopy - Avg.  risk and is 50 yrs.  old or older Yes.  Prior Negative Screening - Now for repeat screening. N/A  History of Adenoma - Now for follow-up colonoscopy & has been > or = to 3 yrs.  N/A  Polyps removed today? Yes ASA CLASS:   Class II INDICATIONS:Screening for colonic neoplasia and Colorectal Neoplasm Risk Assessment for this procedure is average risk. MEDICATIONS: Monitored anesthesia care and Propofol 300 mg IV  DESCRIPTION OF PROCEDURE:   After the risks benefits and alternatives of the procedure were thoroughly explained, informed consent was obtained.  The digital rectal exam revealed no rectal mass.   The LB [id number]  endoscope was introduced through the anus and advanced to the cecum, which was identified by both the appendix and ileocecal valve. No adverse events experienced. The quality of the prep was good.  (Suprep was used)  The instrument was then slowly withdrawn as the colon was fully examined. Estimated blood loss is zero unless otherwise noted in this procedure report.      COLON FINDINGS: Six sessile polyps ranging from 4 to 63mm in size were found in the ascending colon (3) and at the hepatic flexure (3).  Polypectomies were performed using snare cautery (3) and with a cold snare (3).  The resection was complete, the polyp tissue was completely retrieved and sent to histology.   Two sessile polyps ranging from 5 to 71mm in size were found in the transverse colon. Polypectomies were performed with a cold snare.  The resection was complete, the polyp tissue was  completely retrieved and sent to histology.   Five sessile polyps ranging from 5 to 6mm in size were found in the sigmoid colon (4) and rectum (1).  Polypectomies were performed with a cold snare.  The resection was complete, the polyp tissue was completely retrieved and sent to histology.  Retroflexed views revealed internal hemorrhoids. The time to cecum = 2.8 Withdrawal time = 22.3   The scope was withdrawn and the procedure completed.  COMPLICATIONS: There were no immediate complications.  ENDOSCOPIC IMPRESSION: 1.   Six sessile polyps ranging from 4 to 76mm in size were found in the ascending colon and at the hepatic flexure; polypectomies were performed using snare cautery and with a cold snare 2.   Two sessile polyps ranging from 5 to 71mm in size were found in the transverse colon; polypectomies were performed with a cold snare 3.   Five sessile polyps ranging from 5 to 50mm in size were found in the sigmoid colon and rectum; polypectomies were performed with a cold snare  RECOMMENDATIONS: 1.  Avoid all NSAIDs for the next 2 weeks. 2.  Await pathology results 3.  Timing of repeat colonoscopy will be determined by pathology findings. 4.  You will receive a letter within 1-2 weeks with the results of your biopsy as well as final recommendations.  Please call my office if you have not received a letter after 3 weeks.  eSigned:  Jerene Bears, MD 03/23/2015 10:46 AM   cc:  the patient, Alma Friendly, NP   PATIENT NAME:  Nathaniel [name], Nathaniel [name]  MR#: [id number]

## 2015-03-23 NOTE — Patient Instructions (Signed)
Colon polyps removed today. Handouts given today on polyps and hemorrhoids. Do not take anti-inflammatory, NSAIDS medications for 2 weeks.   YOU HAD AN ENDOSCOPIC PROCEDURE TODAY AT [facility]:   Refer to the procedure report that was given to you for any specific questions about what was found during the examination.  If the procedure report does not answer your questions, please call your gastroenterologist to clarify.  If you requested that your care partner not be given the details of your procedure findings, then the procedure report has been included in a sealed envelope for you to review at your convenience later.  YOU SHOULD EXPECT: Some feelings of bloating in the abdomen. Passage of more gas than usual.  Walking can help get rid of the air that was put into your GI tract during the procedure and reduce the bloating. If you had a lower endoscopy (such as a colonoscopy or flexible sigmoidoscopy) you may notice spotting of blood in your stool or on the toilet paper. If you underwent a bowel prep for your procedure, you may not have a normal bowel movement for a few days.  Please Note:  You might notice some irritation and congestion in your nose or some drainage.  This is from the oxygen used during your procedure.  There is no need for concern and it should clear up in a day or so.  SYMPTOMS TO REPORT IMMEDIATELY:   Following lower endoscopy (colonoscopy or flexible sigmoidoscopy):  Excessive amounts of blood in the stool  Significant tenderness or worsening of abdominal pains  Swelling of the abdomen that is new, acute  Fever of 100F or higher   For urgent or emergent issues, a gastroenterologist can be reached at any hour by calling [phone].   DIET: Your first meal following the procedure should be a small meal and then it is ok to progress to your normal diet. Heavy or fried foods are harder to digest and may make you feel nauseous or bloated.  Likewise,  meals heavy in dairy and vegetables can increase bloating.  Drink plenty of fluids but you should avoid alcoholic beverages for 24 hours.  ACTIVITY:  You should plan to take it easy for the rest of today and you should NOT DRIVE or use heavy machinery until tomorrow (because of the sedation medicines used during the test).    FOLLOW UP: Our staff will call the number listed on your records the next business day following your procedure to check on you and address any questions or concerns that you may have regarding the information given to you following your procedure. If we do not reach you, we will leave a message.  However, if you are feeling well and you are not experiencing any problems, there is no need to return our call.  We will assume that you have returned to your regular daily activities without incident.  If any biopsies were taken you will be contacted by phone or by letter within the next 1-3 weeks.  Please call us at [phone] if you have not heard about the biopsies in 3 weeks.    SIGNATURES/CONFIDENTIALITY: You and/or your care partner have signed paperwork which will be entered into your electronic medical record.  These signatures attest to the fact that that the information above on your After Visit Summary has been reviewed and is understood.  Full responsibility of the confidentiality of this discharge information lies with you and/or your care-partner.

## 2015-03-24 ENCOUNTER — Telehealth: Payer: Self-pay

## 2015-03-24 NOTE — Telephone Encounter (Signed)
  Follow up Call-  Call back number 03/23/2015  Post procedure Call Back phone  # [phone]  Permission to leave phone message Yes     Patient questions:  Do you have a fever, pain , or abdominal swelling? No. Pain Score  0 *  Have you tolerated food without any problems? Yes.    Have you been able to return to your normal activities? Yes.    Do you have any questions about your discharge instructions: Diet   No. Medications  No. Follow up visit  No.  Do you have questions or concerns about your Care? No.  Actions: * If pain score is 4 or above: No action needed, pain <4.

## 2015-03-29 ENCOUNTER — Encounter: Payer: Self-pay | Admitting: Internal Medicine

## 2015-04-05 ENCOUNTER — Other Ambulatory Visit: Payer: Self-pay | Admitting: Primary Care

## 2015-04-05 DIAGNOSIS — E785 Hyperlipidemia, unspecified: Secondary | ICD-10-CM

## 2015-04-05 MED ORDER — SIMVASTATIN 40 MG PO TABS: 40.0000 mg | ORAL_TABLET | Freq: Every day | ORAL | Status: DC

## 2015-04-05 NOTE — Telephone Encounter (Signed)
Received faxed request from [facility] to change simvastatin (ZOCOR) 40 MG tablet to 90 days supply.  Last prescribed on 02/02/15. Last seen on 01/17/15. Next appointment on 07/19/15.

## 2015-07-19 ENCOUNTER — Ambulatory Visit: Payer: Medicare Other | Admitting: Primary Care

## 2015-07-26 ENCOUNTER — Telehealth: Payer: Self-pay | Admitting: *Deleted

## 2015-07-26 ENCOUNTER — Ambulatory Visit: Payer: Medicare Other | Admitting: Primary Care

## 2015-07-26 DIAGNOSIS — M25511 Pain in right shoulder: Secondary | ICD-10-CM

## 2015-07-26 MED ORDER — TIZANIDINE HCL 2 MG PO TABS: 2.0000 mg | ORAL_TABLET | Freq: Two times a day (BID) | ORAL | Status: DC | PRN

## 2015-07-26 NOTE — Telephone Encounter (Signed)
Called and spoken to patient this morning regarding his follow up. Went ahead and schedule his physical in June. Patient also stated that he would like the muscle relaxer that Anda Kraft has given him before because his right shoulder is acting up again. Tizanidine (ZANAFLEX) 2 MG tablet was prescribed on 12/14/2014.

## 2015-07-26 NOTE — Telephone Encounter (Signed)
Noted.  Refill sent to pharmacy. 

## 2015-10-11 ENCOUNTER — Other Ambulatory Visit: Payer: Self-pay | Admitting: Acute Care

## 2015-10-11 DIAGNOSIS — F17210 Nicotine dependence, cigarettes, uncomplicated: Principal | ICD-10-CM

## 2015-11-28 DIAGNOSIS — D485 Neoplasm of uncertain behavior of skin: Secondary | ICD-10-CM | POA: Diagnosis not present

## 2015-11-28 DIAGNOSIS — B079 Viral wart, unspecified: Secondary | ICD-10-CM | POA: Diagnosis not present

## 2015-11-28 DIAGNOSIS — L820 Inflamed seborrheic keratosis: Secondary | ICD-10-CM | POA: Diagnosis not present

## 2015-11-28 DIAGNOSIS — L821 Other seborrheic keratosis: Secondary | ICD-10-CM | POA: Diagnosis not present

## 2015-11-28 DIAGNOSIS — D1801 Hemangioma of skin and subcutaneous tissue: Secondary | ICD-10-CM | POA: Diagnosis not present

## 2015-11-28 DIAGNOSIS — L538 Other specified erythematous conditions: Secondary | ICD-10-CM | POA: Diagnosis not present

## 2015-11-28 DIAGNOSIS — Z85820 Personal history of malignant melanoma of skin: Secondary | ICD-10-CM | POA: Diagnosis not present

## 2015-11-28 DIAGNOSIS — Z85828 Personal history of other malignant neoplasm of skin: Secondary | ICD-10-CM | POA: Diagnosis not present

## 2015-11-28 DIAGNOSIS — L298 Other pruritus: Secondary | ICD-10-CM | POA: Diagnosis not present

## 2015-12-30 ENCOUNTER — Other Ambulatory Visit: Payer: Self-pay | Admitting: Primary Care

## 2015-12-30 DIAGNOSIS — E785 Hyperlipidemia, unspecified: Secondary | ICD-10-CM

## 2016-01-06 ENCOUNTER — Telehealth: Payer: Self-pay | Admitting: Primary Care

## 2016-01-06 NOTE — Telephone Encounter (Signed)
LM for pt to come in on 6/20 at 11 for lab (instead of existing 10:15 lab appt), and sch AVW with Lesia for 11:15 on 6/20, mn

## 2016-01-10 ENCOUNTER — Other Ambulatory Visit (INDEPENDENT_AMBULATORY_CARE_PROVIDER_SITE_OTHER): Payer: Medicare Other

## 2016-01-10 DIAGNOSIS — E785 Hyperlipidemia, unspecified: Secondary | ICD-10-CM | POA: Diagnosis not present

## 2016-01-10 LAB — LIPID PANEL
CHOL/HDL RATIO: 6
Cholesterol: 230 mg/dL — ABNORMAL HIGH (ref 0–200)
HDL: 38.1 mg/dL — AB (ref >39.00)
LDL CALC: 161 mg/dL — AB (ref 0–99)
NONHDL: 192.13
TRIGLYCERIDES: 155 mg/dL — AB (ref 0.0–149.0)
VLDL: 31 mg/dL (ref 0.0–40.0)

## 2016-01-10 LAB — COMPREHENSIVE METABOLIC PANEL
ALT: 16 U/L (ref 0–53)
AST: 18 U/L (ref 0–37)
Albumin: 4.2 g/dL (ref 3.5–5.2)
Alkaline Phosphatase: 91 U/L (ref 39–117)
BILIRUBIN TOTAL: 0.5 mg/dL (ref 0.2–1.2)
BUN: 9 mg/dL (ref 6–23)
CALCIUM: 9.6 mg/dL (ref 8.4–10.5)
CHLORIDE: 103 meq/L (ref 96–112)
CO2: 31 meq/L (ref 19–32)
Creatinine, Ser: 1.1 mg/dL (ref 0.40–1.50)
GFR: 70.98 mL/min (ref >60.00)
GLUCOSE: 106 mg/dL — AB (ref 70–99)
Potassium: 4.8 mEq/L (ref 3.5–5.1)
Sodium: 139 mEq/L (ref 135–145)
Total Protein: 7.1 g/dL (ref 6.0–8.3)

## 2016-01-16 ENCOUNTER — Ambulatory Visit (INDEPENDENT_AMBULATORY_CARE_PROVIDER_SITE_OTHER): Payer: Medicare Other | Admitting: Primary Care

## 2016-01-16 ENCOUNTER — Encounter: Payer: Self-pay | Admitting: Primary Care

## 2016-01-16 VITALS — BP 124/84 | HR 86 | Temp 98.6°F | Ht 76.0 in | Wt 191.8 lb

## 2016-01-16 DIAGNOSIS — Z0000 Encounter for general adult medical examination without abnormal findings: Secondary | ICD-10-CM | POA: Insufficient documentation

## 2016-01-16 DIAGNOSIS — R0989 Other specified symptoms and signs involving the circulatory and respiratory systems: Secondary | ICD-10-CM

## 2016-01-16 DIAGNOSIS — Z720 Tobacco use: Secondary | ICD-10-CM

## 2016-01-16 DIAGNOSIS — R251 Tremor, unspecified: Secondary | ICD-10-CM | POA: Diagnosis not present

## 2016-01-16 DIAGNOSIS — E785 Hyperlipidemia, unspecified: Secondary | ICD-10-CM

## 2016-01-16 NOTE — Assessment & Plan Note (Signed)
Improved with occasional use of clonazepam. Has not refill clonazepam in one year.

## 2016-01-16 NOTE — Assessment & Plan Note (Signed)
Due for repeat low-dose CT scan which is scheduled for later this week.

## 2016-01-16 NOTE — Assessment & Plan Note (Signed)
Cholesterol above goal today, stopped taking simvastatin greater than 6 months ago due to myalgias. Currently managed on aspirin 81 mg. Declines additional medication to treat cluster all as he will work on diet and exercise. Strongly recommended treatment for cholesterol due to greater than 50% RCA blockage on carotid ultrasound 1 year ago. Will repeat carotids this year and continue to monitor.

## 2016-01-16 NOTE — Progress Notes (Signed)
Patient ID: Nathaniel [name], male   DOB: 1950-07-27, 65 y.o.   MRN: [id number]  HPI:  Past Medical History  Diagnosis Date  . Chicken pox   . GERD (gastroesophageal reflux disease)   . Seizures     None since age 37  . Hyperlipidemia     Current Outpatient Prescriptions  Medication Sig Dispense Refill  . aspirin 81 MG tablet Take 81 mg by mouth daily.    . clonazePAM (KLONOPIN) 0.5 MG tablet Take 1/2 tablet by mouth daily for three days as needed for tremors. 15 tablet 0  . esomeprazole (NEXIUM) 20 MG capsule Take 20 mg by mouth daily at 12 noon.    Marland Kitchen ibuprofen (ADVIL,MOTRIN) 200 MG tablet Take 200 mg by mouth as needed.    Marland Kitchen tiZANidine (ZANAFLEX) 2 MG tablet Take 1 tablet (2 mg total) by mouth 2 (two) times daily as needed for muscle spasms. 30 tablet 0   No current facility-administered medications for this visit.    Allergies  Allergen Reactions  . Prozac [Fluoxetine Hcl] Other (See Comments)    Extreme depression    Family History  Problem Relation Age of Onset  . Heart disease Mother     Arrthymia   . Stroke Father     Deceased  . Dementia Father   . Heart disease Brother     Myocardial infarction  . Colon cancer Neg Hx     Social History   Social History  . Marital Status: Married    Spouse Name: N/A  . Number of Children: N/A  . Years of Education: N/A   Occupational History  . Not on file.   Social History Main Topics  . Smoking status: Current Every Day Smoker -- 1.00 packs/day for 50 years    Types: Cigarettes  . Smokeless tobacco: Never Used  . Alcohol Use: No  . Drug Use: No  . Sexual Activity: Not on file   Other Topics Concern  . Not on file   Social History Narrative   Retired.   Stays busy with household work, Surveyor, mining.   Married for 3 years.   Grown children.   Enjoys reading.     Hospitiliaztions: None  Health Maintenance:    Flu: Did not complete last season  Tetanus: Completed in 2012  Pneumovax: Never completed,  Declines  Prevnar: Never completed, declines  Zostavax: Never completed, declines  Colonoscopy: Completed in 2016, numerous polyps. Due August 2017.  Eye Doctor: Completed in May 2017, new prescription. No glaucoma. Mild  Cataracts.  Dental Exam: Completed in over 25 years.   PSA: Normal in 2016    Providers: Alma Friendly, NP-C, PCP   I have personally reviewed and have noted: 1. The patient's medical and social history 2. Their use of alcohol, tobacco or illicit drugs 3. Their current medications and supplements 4. The patient's functional ability including ADL's, fall risks, home safety risks and  hearing or visual impairment. 5. Diet and physical activities 6. Evidence for depression or mood disorder  Subjective:   Review of Systems:   Constitutional: Denies fever, headache or abrupt weight changes. He has noticed fatigue upon waking, gradual build of energy, feels better around 6 pm. He doesn't feel as though he's sleeping well, but denies difficulty falling asleep and doesn't wake in the middle of the night, denies snoring.  HEENT: Denies eye pain, eye redness, ear pain, ringing in the ears, wax buildup, runny nose, nasal congestion, bloody nose, or sore throat.  Respiratory: Denies difficulty breathing, shortness of breath, cough or sputum production.   Cardiovascular: Denies chest pain, chest tightness, palpitations or swelling in the hands or feet.  Gastrointestinal: Denies abdominal pain, bloating, constipation, diarrhea or blood in the stool.  GU: Denies urgency, frequency, pain with urination, burning sensation, blood in urine, odor or discharge. Musculoskeletal: Denies decrease in range of motion, difficulty with gait. Will noticed joint stiffness after laying for a prolonged period of time. Uses the tizanidine as needed, very sparingly. Uses Clonazepam very sparingly for essential tremor.  Skin: Denies redness, rashes, lesions or ulcercations.  Neurological: Denies  dizziness, difficulty with memory, difficulty with speech or problems with balance and coordination.   No other specific complaints in a complete review of systems (except as listed in HPI above).  Objective:  PE:   BP 124/84 mmHg  Pulse 86  Temp(Src) 98.6 F (37 C) (Oral)  Ht 6\' 4"  (1.93 m)  Wt 191 lb 12.8 oz (87 kg)  BMI 23.36 kg/m2  SpO2 98% Wt Readings from Last 3 Encounters:  01/16/16 191 lb 12.8 oz (87 kg)  03/23/15 198 lb (89.812 kg)  03/09/15 198 lb (89.812 kg)    General: Appears their stated age, well developed, well nourished in NAD. Skin: Warm, dry and intact. No rashes, lesions or ulcerations noted. HEENT: Head: normal shape and size; Eyes: sclera white, no icterus, conjunctiva pink, PERRLA and EOMs intact; Ears: Tm's gray and intact, normal light reflex; Nose: mucosa pink and moist, septum midline; Throat/Mouth: Teeth present, mucosa pink and moist, no exudate, lesions or ulcerations noted.  Neck: Normal range of motion. Neck supple, trachea midline. No massses, lumps or thyromegaly present.  Cardiovascular: Normal rate and rhythm. S1,S2 noted.  No murmur, rubs or gallops noted. No JVD or BLE edema. Right carotid bruit noted. Dopplers completed last year. Pulmonary/Chest: Normal effort and positive vesicular breath sounds. No respiratory distress. No wheezes, rales or ronchi noted.  Abdomen: Soft and nontender. Normal bowel sounds, no bruits noted. No distention or masses noted. Liver, spleen and kidneys non palpable. Musculoskeletal: Normal range of motion. No signs of joint swelling. No difficulty with gait.  Neurological: Alert and oriented. Cranial nerves II-XII intact. Coordination normal. +DTRs bilaterally. Psychiatric: Mood and affect normal. Behavior is normal. Judgment and thought content normal.     BMET    Component Value Date/Time   NA 139 01/10/2016 1059   K 4.8 01/10/2016 1059   CL 103 01/10/2016 1059   CO2 31 01/10/2016 1059   GLUCOSE 106*  01/10/2016 1059   BUN 9 01/10/2016 1059   CREATININE 1.10 01/10/2016 1059   CALCIUM 9.6 01/10/2016 1059    Lipid Panel     Component Value Date/Time   CHOL 230* 01/10/2016 1059   TRIG 155.0* 01/10/2016 1059   HDL 38.10* 01/10/2016 1059   CHOLHDL 6 01/10/2016 1059   VLDL 31.0 01/10/2016 1059   LDLCALC 161* 01/10/2016 1059    CBC    Component Value Date/Time   WBC 10.2 01/10/2015 1000   RBC 4.57 01/10/2015 1000   HGB 14.2 01/10/2015 1000   HCT 42.3 01/10/2015 1000   PLT 237.0 01/10/2015 1000   MCV 92.7 01/10/2015 1000   MCHC 33.5 01/10/2015 1000   RDW 13.8 01/10/2015 1000    Hgb A1C No results found for: HGBA1C    Assessment and Plan:   Medicare Annual Wellness Visit:  Diet: Endorses a poor diet. Breakfast: Berniece Salines, eggs, toast, cereal Lunch:Skips Snack: Crackers Dinner: Fast food, steak,  hamburgers, pasta Desserts: Occasionally Beverages: Sweet tea, water Physical activity: Active, does not exercise Depression/mood screen: Negative Hearing: Intact to whispered voice Visual acuity: Grossly normal, performs annual eye exam  ADLs: Capable Fall risk: None Home safety: Good Cognitive evaluation: Intact to orientation, naming, recall and repetition EOL planning: Adv directives, full code/ I agree  Preventative Medicine: Declines all immunizations including pneumonia and Zostavax. Poor diet and does not exercise. Labs today with hyperlipidemia and mild hyperglycemia. Long discussion regarding importance of healthy diet and exercise to reduce risk of further medical complications. 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 He stopped taking statin 6+ months ago due to myalgias and did not notify our office. He declines any additional medication for cholesterol this time. Currently managed on aspirin 81 mg. Does not have advanced directives, packet provided today. PSA up-to-date. Due for repeat  colonoscopy and has scheduled. Due for repeat low-dose CT scan for lung cancer screening which is scheduled for later this week.  Next appointment: Repeat labs in 6 months, follow-up in one year.

## 2016-01-16 NOTE — Patient Instructions (Signed)
Your cholesterol is too high. Work to [employer] by limiting:  Nathaniel [name], eggs, red meat, fatty foods, fried foods.  Your blood sugar is a little elevated. You do not have diabetes, but you may be prediabetic. Work to reduce consumption of sweet tea.  Increase lean protein, vegetables, fruit, whole grains.  Schedule a lab only appointment in 6 months for re-evaluation of your cholesterol and blood sugar.  Follow up in 1 year for repeat physical or sooner if needed.  It was a pleasure to see you today!  High Cholesterol High cholesterol refers to having a high level of cholesterol in your blood. Cholesterol is a white, waxy, fat-like protein that your body needs in small amounts. Your liver makes all the cholesterol you need. Excess cholesterol comes from the food you eat. Cholesterol travels in your bloodstream through your blood vessels. If you have high cholesterol, deposits (plaque) may build up on the walls of your blood vessels. This makes the arteries narrower and stiffer. Plaque increases your risk of heart attack and stroke. Work with your health care provider to keep your cholesterol levels in a healthy range. RISK FACTORS Several things can make you more likely to have high cholesterol. These include:   Eating foods high in animal fat (saturated fat) or cholesterol.  Being overweight.  Not getting enough exercise.  Having a family history of high cholesterol. SIGNS AND SYMPTOMS High cholesterol does not cause symptoms. DIAGNOSIS  Your health care provider can do a blood test to check whether you have high cholesterol. If you are older than 20, your health care provider may check your cholesterol every 4-6 years. You may be checked more often if you already have high cholesterol or other risk factors for heart disease. The blood test for cholesterol measures the following:  Bad cholesterol (LDL cholesterol). This is the type of cholesterol that causes heart disease. This  number should be less than 100.  Good cholesterol (HDL cholesterol). This type helps protect against heart disease. A healthy level of HDL cholesterol is 60 or higher.  Total cholesterol. This is the combined number of LDL cholesterol and HDL cholesterol. A healthy number is less than 200. TREATMENT  High cholesterol can be treated with diet changes, lifestyle changes, and medicine.   Diet changes may include eating more whole grains, fruits, vegetables, nuts, and fish. You may also have to cut back on red meat and foods with a lot of added sugar.  Lifestyle changes may include getting at least 40 minutes of aerobic exercise three times a week. Aerobic exercises include walking, biking, and swimming. Aerobic exercise along with a healthy diet can help you maintain a healthy weight. Lifestyle changes may also include quitting smoking.  If diet and lifestyle changes are not enough to lower your cholesterol, your health care provider may prescribe a statin medicine. This medicine has been shown to lower cholesterol and also lower the risk of heart disease. HOME CARE INSTRUCTIONS  Only take over-the-counter or prescription medicines as directed by your health care provider.   Follow a healthy diet as directed by your health care provider. For instance:   Eat chicken (without skin), fish, veal, shellfish, ground [place] breast, and round or loin cuts of red meat.  Do not eat fried foods and fatty meats, such as hot dogs and salami.   Eat plenty of fruits, such as apples.   Eat plenty of vegetables, such as broccoli, potatoes, and carrots.   Eat beans, peas, and  lentils.   Eat grains, such as barley, rice, couscous, and bulgur wheat.   Eat pasta without cream sauces.   Use skim or nonfat milk and low-fat or nonfat yogurt and cheeses. Do not eat or drink whole milk, cream, ice cream, egg yolks, and hard cheeses.   Do not eat stick margarine or tub margarines that contain trans  fats (also called partially hydrogenated oils).   Do not eat cakes, cookies, crackers, or other baked goods that contain trans fats.   Do not eat saturated tropical oils, such as coconut and palm oil.   Exercise as directed by your health care provider. Increase your activity level with activities such as gardening or walking.   Keep all follow-up appointments.  SEEK MEDICAL CARE IF:  You are struggling to maintain a healthy diet or weight.  You need help starting an exercise program.  You need help to stop smoking. SEEK IMMEDIATE MEDICAL CARE IF:  You have chest pain.  You have trouble breathing.   This information is not intended to replace advice given to you by your health care provider. Make sure you discuss any questions you have with your health care provider.   Document Released: 07/09/2005 Document Revised: 07/30/2014 Document Reviewed: 05/01/2013 Elsevier Interactive Patient Education [employer].

## 2016-01-16 NOTE — Progress Notes (Signed)
Pre visit review using our clinic review tool, if applicable. No additional management support is needed unless otherwise documented below in the visit note. 

## 2016-01-16 NOTE — Assessment & Plan Note (Signed)
Declines all immunizations including pneumonia and Zostavax. Poor diet and does not exercise. Labs today with hyperlipidemia and mild hyperglycemia. Long discussion regarding importance of healthy diet and exercise to reduce risk of further medical complications. 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 He stopped taking statin 6+ months ago due to myalgias and did not notify our office. He declines any additional medication for cholesterol this time. Currently managed on aspirin 81 mg. Does not have advanced directives, packet provided today. PSA up-to-date. Due for repeat colonoscopy and has scheduled. Due for repeat low-dose CT scan for lung cancer screening which is scheduled for later this week.  I have personally reviewed and have noted: 1. The patient's medical and social history 2. Their use of alcohol, tobacco or illicit drugs 3. Their current medications and supplements 4. The patient's functional ability including ADL's, fall risks, home safety risks and  hearing or visual impairment. 5. Diet and physical activities 6. Evidence for depression or mood disorder  Anticipatory guidance provided today.

## 2016-01-20 ENCOUNTER — Ambulatory Visit
Admission: RE | Admit: 2016-01-20 | Discharge: 2016-01-20 | Disposition: A | Discharge status: Home / Self Care | Payer: Medicare Other | Source: Ambulatory Visit | Attending: Acute Care | Admitting: Acute Care

## 2016-01-20 DIAGNOSIS — Z122 Encounter for screening for malignant neoplasm of respiratory organs: Secondary | ICD-10-CM | POA: Diagnosis not present

## 2016-01-20 DIAGNOSIS — F17210 Nicotine dependence, cigarettes, uncomplicated: Secondary | ICD-10-CM | POA: Insufficient documentation

## 2016-01-20 DIAGNOSIS — J439 Emphysema, unspecified: Secondary | ICD-10-CM | POA: Diagnosis not present

## 2016-01-20 DIAGNOSIS — Z87891 Personal history of nicotine dependence: Secondary | ICD-10-CM | POA: Diagnosis not present

## 2016-02-08 ENCOUNTER — Encounter: Payer: Self-pay | Admitting: Internal Medicine

## 2016-02-16 ENCOUNTER — Ambulatory Visit: Payer: Medicare Other

## 2016-02-16 ENCOUNTER — Encounter (INDEPENDENT_AMBULATORY_CARE_PROVIDER_SITE_OTHER): Payer: Self-pay

## 2016-02-16 DIAGNOSIS — R0989 Other specified symptoms and signs involving the circulatory and respiratory systems: Secondary | ICD-10-CM

## 2016-02-16 DIAGNOSIS — I6523 Occlusion and stenosis of bilateral carotid arteries: Secondary | ICD-10-CM | POA: Diagnosis not present

## 2016-02-22 ENCOUNTER — Encounter: Payer: Self-pay | Admitting: *Deleted

## 2016-07-06 ENCOUNTER — Other Ambulatory Visit: Payer: Self-pay | Admitting: Internal Medicine

## 2016-07-06 DIAGNOSIS — E7800 Pure hypercholesterolemia, unspecified: Secondary | ICD-10-CM

## 2016-07-06 DIAGNOSIS — R7301 Impaired fasting glucose: Secondary | ICD-10-CM

## 2016-07-12 ENCOUNTER — Other Ambulatory Visit (INDEPENDENT_AMBULATORY_CARE_PROVIDER_SITE_OTHER): Payer: Medicare Other

## 2016-07-12 DIAGNOSIS — E7800 Pure hypercholesterolemia, unspecified: Secondary | ICD-10-CM | POA: Diagnosis not present

## 2016-07-12 DIAGNOSIS — R7301 Impaired fasting glucose: Secondary | ICD-10-CM

## 2016-07-12 LAB — LIPID PANEL
CHOL/HDL RATIO: 6
CHOLESTEROL: 221 mg/dL — AB (ref 0–200)
HDL: 39.6 mg/dL (ref >39.00)
NonHDL: 181.42
TRIGLYCERIDES: 212 mg/dL — AB (ref 0.0–149.0)
VLDL: 42.4 mg/dL — AB (ref 0.0–40.0)

## 2016-07-12 LAB — HEMOGLOBIN A1C: Hgb A1c MFr Bld: 6 % (ref 4.6–6.5)

## 2016-07-12 LAB — LDL CHOLESTEROL, DIRECT: LDL DIRECT: 155 mg/dL

## 2016-07-17 ENCOUNTER — Encounter: Payer: Self-pay | Admitting: *Deleted

## 2016-07-30 DIAGNOSIS — L538 Other specified erythematous conditions: Secondary | ICD-10-CM | POA: Diagnosis not present

## 2016-07-30 DIAGNOSIS — X32XXXA Exposure to sunlight, initial encounter: Secondary | ICD-10-CM | POA: Diagnosis not present

## 2016-07-30 DIAGNOSIS — Z85820 Personal history of malignant melanoma of skin: Secondary | ICD-10-CM | POA: Diagnosis not present

## 2016-07-30 DIAGNOSIS — Z85828 Personal history of other malignant neoplasm of skin: Secondary | ICD-10-CM | POA: Diagnosis not present

## 2016-07-30 DIAGNOSIS — D225 Melanocytic nevi of trunk: Secondary | ICD-10-CM | POA: Diagnosis not present

## 2016-07-30 DIAGNOSIS — L820 Inflamed seborrheic keratosis: Secondary | ICD-10-CM | POA: Diagnosis not present

## 2016-07-30 DIAGNOSIS — L570 Actinic keratosis: Secondary | ICD-10-CM | POA: Diagnosis not present

## 2016-07-30 DIAGNOSIS — D2261 Melanocytic nevi of right upper limb, including shoulder: Secondary | ICD-10-CM | POA: Diagnosis not present

## 2016-11-05 DIAGNOSIS — L820 Inflamed seborrheic keratosis: Secondary | ICD-10-CM | POA: Diagnosis not present

## 2016-11-05 DIAGNOSIS — L538 Other specified erythematous conditions: Secondary | ICD-10-CM | POA: Diagnosis not present

## 2016-11-05 DIAGNOSIS — D485 Neoplasm of uncertain behavior of skin: Secondary | ICD-10-CM | POA: Diagnosis not present

## 2016-11-05 DIAGNOSIS — L570 Actinic keratosis: Secondary | ICD-10-CM | POA: Diagnosis not present

## 2016-11-05 DIAGNOSIS — L821 Other seborrheic keratosis: Secondary | ICD-10-CM | POA: Diagnosis not present

## 2016-11-05 DIAGNOSIS — X32XXXA Exposure to sunlight, initial encounter: Secondary | ICD-10-CM | POA: Diagnosis not present

## 2016-11-22 DIAGNOSIS — L820 Inflamed seborrheic keratosis: Secondary | ICD-10-CM | POA: Diagnosis not present

## 2016-11-22 DIAGNOSIS — L538 Other specified erythematous conditions: Secondary | ICD-10-CM | POA: Diagnosis not present

## 2016-11-23 ENCOUNTER — Ambulatory Visit (INDEPENDENT_AMBULATORY_CARE_PROVIDER_SITE_OTHER): Payer: Medicare Other | Admitting: Primary Care

## 2016-11-23 ENCOUNTER — Encounter: Payer: Self-pay | Admitting: Primary Care

## 2016-11-23 VITALS — BP 158/102 | HR 78 | Temp 98.4°F | Ht 76.0 in | Wt 196.1 lb

## 2016-11-23 DIAGNOSIS — R7303 Prediabetes: Secondary | ICD-10-CM | POA: Insufficient documentation

## 2016-11-23 DIAGNOSIS — R5382 Chronic fatigue, unspecified: Secondary | ICD-10-CM | POA: Insufficient documentation

## 2016-11-23 DIAGNOSIS — M79604 Pain in right leg: Secondary | ICD-10-CM | POA: Insufficient documentation

## 2016-11-23 DIAGNOSIS — R5383 Other fatigue: Secondary | ICD-10-CM | POA: Diagnosis not present

## 2016-11-23 DIAGNOSIS — R251 Tremor, unspecified: Secondary | ICD-10-CM

## 2016-11-23 DIAGNOSIS — M79605 Pain in left leg: Secondary | ICD-10-CM

## 2016-11-23 DIAGNOSIS — K4090 Unilateral inguinal hernia, without obstruction or gangrene, not specified as recurrent: Secondary | ICD-10-CM | POA: Diagnosis not present

## 2016-11-23 HISTORY — DX: Pain in left leg: M79.605

## 2016-11-23 HISTORY — DX: Pain in right leg: M79.604

## 2016-11-23 LAB — CBC
HCT: 43.8 % (ref 38.5–50.0)
HEMOGLOBIN: 14.6 g/dL (ref 13.2–17.1)
MCH: 30.7 pg (ref 27.0–33.0)
MCHC: 33.3 g/dL (ref 32.0–36.0)
MCV: 92 fL (ref 80.0–100.0)
MPV: 11.1 fL (ref 7.5–12.5)
PLATELETS: 301 10*3/uL (ref 140–400)
RBC: 4.76 MIL/uL (ref 4.20–5.80)
RDW: 13.6 % (ref 11.0–15.0)
WBC: 12.6 10*3/uL — ABNORMAL HIGH (ref 3.8–10.8)

## 2016-11-23 NOTE — Assessment & Plan Note (Signed)
Noted to right side upon exam today. Overall his hernia is not bothersome, we'll have him continue to monitor and notify of increased pain, bulge.

## 2016-11-23 NOTE — Assessment & Plan Note (Addendum)
Could be secondary to increased activity with volunteering. Given history of hyperlipidemia with tobacco abuse, will rule out any arterial involvement with ABIs. 1+ DP and PT pulses bilaterally.

## 2016-11-23 NOTE — Assessment & Plan Note (Signed)
Symptoms today likely secondary to emphysema, will likely need albuterol and/or ICS/LABA. We will first rule out metabolic cause by checking TSH, CBC and Lyme disease panel. Recheck A1c given history of prediabetes. Will await results.

## 2016-11-23 NOTE — Progress Notes (Signed)
Pre visit review using our clinic review tool, if applicable. No additional management support is needed unless otherwise documented below in the visit note. 

## 2016-11-23 NOTE — Progress Notes (Signed)
Subjective:    Patient ID: Nathaniel [name], male    DOB: [birth year], 66 y.o.   MRN: [id number]  HPI  Nathaniel [name] is a 66 year old male who presents today with multiple complaints.   1) Groin Swelling: Located to the right groin. He first noticed a pea-sized bulge to the right groin around Christmas 2017. Since then he's noticed mild increase in size, about half the size of a golf ball. Overall he's not bothered by the groin swelling. He notices a popping sensation intermittently when sneezing or coughing, mostly during pollen season. He does notice a burning very occasionally. He denies constipation, difficulty urinating.  2) Fatigue: History of since August 2016. Occurs daily within 4-5 hours after being awake during the day. Most days he will have to take a nap due to his fatigue. History of syncope in 2008 which was determined to be caused by sleep deprivation. He sleeps well during the night as he goes to bed around 10 PM and awakens between 6-8 AM. He does have a history of numerous tick bites over the years, recent tick bite one week ago. He denies fevers, rash, body aches, headaches.  He is a current smoker and has a history of emphysema. He coughs daily, mostly during the morning, at night, and during seasonal changes. He experiences shortness of breath with moderate exertion.   3) Lower Extremity Pain: He's also noticed burning to the posterior bilateral lower extremities with walking and standing for prolonged periods of time, improved with rest. He recently started volunteering and has been more active than usual. He is standing for 5-6 hours at a time. His pain began 1 month ago when he started volunteering.  Review of Systems  Constitutional: Positive for fatigue. Negative for chills and fever.  Respiratory: Positive for cough and shortness of breath. Negative for wheezing.   Cardiovascular: Negative for chest pain.  Genitourinary: Negative for difficulty urinating, penile  pain, penile swelling, scrotal swelling and testicular pain.       Bulging to right groin  Musculoskeletal:       Bilateral lower extremity pain  Neurological: Negative for dizziness, weakness and headaches.       Past Medical History:  Diagnosis Date  . Chicken pox   . GERD (gastroesophageal reflux disease)   . Hyperlipidemia   . Seizures ([place])    None since age 45     Social History   Social History  . Marital status: Married    Spouse name: N/A  . Number of children: N/A  . Years of education: N/A   Occupational History  . Not on file.   Social History Main Topics  . Smoking status: Current Every Day Smoker    Packs/day: 1.00    Years: 50.00    Types: Cigarettes  . Smokeless tobacco: Never Used  . Alcohol use No  . Drug use: No  . Sexual activity: Not on file   Other Topics Concern  . Not on file   Social History Narrative   Retired.   Stays busy with household work, Surveyor, mining.   Married for 3 years.   Grown children.   Enjoys reading.     Past Surgical History:  Procedure Laterality Date  . LAMINECTOMY  1987  . TONSILLECTOMY      Family History  Problem Relation Age of Onset  . Heart disease Mother     Arrthymia   . Stroke Father     Deceased  .  Dementia Father   . Heart disease Brother     Myocardial infarction  . Colon cancer Neg Hx     Allergies  Allergen Reactions  . Prozac [Fluoxetine Hcl] Other (See Comments)    Extreme depression    Current Outpatient Prescriptions on File Prior to Visit  Medication Sig Dispense Refill  . aspirin 81 MG tablet Take 81 mg by mouth daily.    . clonazePAM (KLONOPIN) 0.5 MG tablet Take 1/2 tablet by mouth daily for three days as needed for tremors. 15 tablet 0  . esomeprazole (NEXIUM) 20 MG capsule Take 20 mg by mouth daily at 12 noon.    Marland Kitchen ibuprofen (ADVIL,MOTRIN) 200 MG tablet Take 200 mg by mouth as needed.    Marland Kitchen tiZANidine (ZANAFLEX) 2 MG tablet Take 1 tablet (2 mg total) by mouth 2 (two)  times daily as needed for muscle spasms. 30 tablet 0   No current facility-administered medications on file prior to visit.     BP (!) 158/102   Pulse 78   Temp 98.4 F (36.9 C) (Oral)   Ht 6\' 4"  (1.93 m)   Wt 196 lb 1.9 oz (89 kg)   SpO2 99%   BMI 23.87 kg/m    Objective:   Physical Exam  Constitutional: He appears well-nourished.  Neck: Neck supple.  Cardiovascular: Normal rate and regular rhythm.   Pulses:      Dorsalis pedis pulses are 1+ on the right side, and 1+ on the left side.       Posterior tibial pulses are 1+ on the right side, and 1+ on the left side.  Pulmonary/Chest: Effort normal and breath sounds normal. He has no wheezes. He has no rales.  Genitourinary:  Genitourinary Comments: Evidence of right inguinal hernia noted on exam. Negative for left hernia.  Musculoskeletal:  No calf swelling or tenderness.  Skin: Skin is warm and dry.  Evidence of recent tick bite to left lateral malleolus, healing well.          Assessment & Plan:

## 2016-11-23 NOTE — Patient Instructions (Addendum)
Complete lab work prior to leaving today.   You will be contacted regarding the test to evaluate arterial blood flow through your legs.  Please let us know if you have not heard back within one week.   Your hernia is stable. Please notify me if you develop increased pain and/or if the pain becomes problematic during your daily activities.  I'll be in touch soon. It was a pleasure to see you today!

## 2016-11-24 LAB — HEMOGLOBIN A1C
HEMOGLOBIN A1C: 5.5 % (ref <5.7)
Mean Plasma Glucose: 111 mg/dL

## 2016-11-24 LAB — TSH: TSH: 1.08 m[IU]/L (ref 0.40–4.50)

## 2016-11-26 ENCOUNTER — Other Ambulatory Visit: Payer: Self-pay | Admitting: Primary Care

## 2016-11-26 ENCOUNTER — Other Ambulatory Visit: Payer: Self-pay | Admitting: *Deleted

## 2016-11-26 DIAGNOSIS — R251 Tremor, unspecified: Secondary | ICD-10-CM

## 2016-11-26 DIAGNOSIS — R059 Cough, unspecified: Secondary | ICD-10-CM

## 2016-11-26 DIAGNOSIS — R05 Cough: Secondary | ICD-10-CM

## 2016-11-26 LAB — LYME AB/WESTERN BLOT REFLEX

## 2016-11-26 MED ORDER — CLONAZEPAM 0.5 MG PO TABS: ORAL_TABLET | ORAL | 0 refills | Status: DC

## 2016-11-26 MED ORDER — TIZANIDINE HCL 2 MG PO TABS: 2.0000 mg | ORAL_TABLET | Freq: Two times a day (BID) | ORAL | 2 refills | Status: DC | PRN

## 2016-11-26 NOTE — Telephone Encounter (Signed)
Did you phone this in last week? If not, please phone in with same directions and quantity. He also needed refills of another one of his medications, please call and ask.

## 2016-11-26 NOTE — Telephone Encounter (Signed)
Last Rx 2016. Last OV 11/23/2016.

## 2016-11-26 NOTE — Telephone Encounter (Signed)
See result note from earlier today 

## 2016-11-26 NOTE — Telephone Encounter (Signed)
Called in medication to the pharmacy as instructed. 

## 2016-11-26 NOTE — Telephone Encounter (Signed)
Spoken to patient regarding refills. I apologized and sent the refills.  Patient also stated that he is not feeling good. He start feeling sick on Friday. He believe he has a cold, symptoms include a runny nose, sneezing, and cough congestion.

## 2016-11-27 ENCOUNTER — Other Ambulatory Visit: Payer: Self-pay | Admitting: Primary Care

## 2016-11-27 ENCOUNTER — Telehealth: Payer: Self-pay | Admitting: Primary Care

## 2016-11-27 ENCOUNTER — Ambulatory Visit (INDEPENDENT_AMBULATORY_CARE_PROVIDER_SITE_OTHER)
Admission: RE | Admit: 2016-11-27 | Discharge: 2016-11-27 | Disposition: A | Discharge status: Home / Self Care | Payer: Medicare Other | Source: Ambulatory Visit | Attending: Primary Care | Admitting: Primary Care

## 2016-11-27 DIAGNOSIS — R05 Cough: Secondary | ICD-10-CM

## 2016-11-27 DIAGNOSIS — R059 Cough, unspecified: Secondary | ICD-10-CM

## 2016-11-27 DIAGNOSIS — R0602 Shortness of breath: Secondary | ICD-10-CM | POA: Diagnosis not present

## 2016-11-27 MED ORDER — BUDESONIDE-FORMOTEROL FUMARATE 160-4.5 MCG/ACT IN AERO: 2.0000 | INHALATION_SPRAY | Freq: Two times a day (BID) | RESPIRATORY_TRACT | 1 refills | Status: DC

## 2016-11-27 NOTE — Telephone Encounter (Signed)
Patient is requesting an antibiotic for a head and chest cold.   Thank you,  -LL

## 2016-11-27 NOTE — Telephone Encounter (Signed)
Patient aware of results.

## 2016-11-28 NOTE — Telephone Encounter (Signed)
Noted. His symptoms to his lower extremities could very well be originating from his back given his prolonged periods of standing. I recommend ibuprofen 600 mg 3 times daily as needed for pain. I also recommend he purchased insoles for his shoes if he is to be standing for prolonged periods of time. Please have him update me in 2 weeks if no improvement in his fatigue and shortness of breath after using the inhaler consistently.

## 2016-11-28 NOTE — Telephone Encounter (Signed)
Spoke to pt and advised per [facility]. Pt will contact office with update in 2wks as requested

## 2016-11-28 NOTE — Telephone Encounter (Addendum)
Spoke to patient and had a fever until two days ago, congestion is all clear. Pain in leg has localized in upper thigh and now he is thinking that it is is back. Patient stated that the pain in his back was so bad yesterday that it was so bad he felt that he was going to pass out. Patient stated that he has not picked the inhaler up yet. Advised patient to pick the inhaler up and start using it and that should help with his lung problem.

## 2016-12-28 DIAGNOSIS — M5136 Other intervertebral disc degeneration, lumbar region: Secondary | ICD-10-CM | POA: Diagnosis not present

## 2016-12-28 DIAGNOSIS — M5416 Radiculopathy, lumbar region: Secondary | ICD-10-CM | POA: Diagnosis not present

## 2016-12-28 DIAGNOSIS — M9905 Segmental and somatic dysfunction of pelvic region: Secondary | ICD-10-CM | POA: Diagnosis not present

## 2016-12-28 DIAGNOSIS — M9903 Segmental and somatic dysfunction of lumbar region: Secondary | ICD-10-CM | POA: Diagnosis not present

## 2016-12-31 DIAGNOSIS — M5416 Radiculopathy, lumbar region: Secondary | ICD-10-CM | POA: Diagnosis not present

## 2016-12-31 DIAGNOSIS — M9905 Segmental and somatic dysfunction of pelvic region: Secondary | ICD-10-CM | POA: Diagnosis not present

## 2016-12-31 DIAGNOSIS — M5136 Other intervertebral disc degeneration, lumbar region: Secondary | ICD-10-CM | POA: Diagnosis not present

## 2016-12-31 DIAGNOSIS — M9903 Segmental and somatic dysfunction of lumbar region: Secondary | ICD-10-CM | POA: Diagnosis not present

## 2017-01-02 DIAGNOSIS — M9905 Segmental and somatic dysfunction of pelvic region: Secondary | ICD-10-CM | POA: Diagnosis not present

## 2017-01-02 DIAGNOSIS — M9903 Segmental and somatic dysfunction of lumbar region: Secondary | ICD-10-CM | POA: Diagnosis not present

## 2017-01-02 DIAGNOSIS — M5136 Other intervertebral disc degeneration, lumbar region: Secondary | ICD-10-CM | POA: Diagnosis not present

## 2017-01-02 DIAGNOSIS — M5416 Radiculopathy, lumbar region: Secondary | ICD-10-CM | POA: Diagnosis not present

## 2017-01-03 DIAGNOSIS — M9905 Segmental and somatic dysfunction of pelvic region: Secondary | ICD-10-CM | POA: Diagnosis not present

## 2017-01-03 DIAGNOSIS — M5136 Other intervertebral disc degeneration, lumbar region: Secondary | ICD-10-CM | POA: Diagnosis not present

## 2017-01-03 DIAGNOSIS — M5416 Radiculopathy, lumbar region: Secondary | ICD-10-CM | POA: Diagnosis not present

## 2017-01-03 DIAGNOSIS — M9903 Segmental and somatic dysfunction of lumbar region: Secondary | ICD-10-CM | POA: Diagnosis not present

## 2017-01-04 ENCOUNTER — Ambulatory Visit: Payer: Medicare Other

## 2017-01-04 ENCOUNTER — Other Ambulatory Visit: Payer: Self-pay | Admitting: Acute Care

## 2017-01-04 DIAGNOSIS — M79605 Pain in left leg: Principal | ICD-10-CM

## 2017-01-04 DIAGNOSIS — M79604 Pain in right leg: Secondary | ICD-10-CM | POA: Diagnosis not present

## 2017-01-04 DIAGNOSIS — F17210 Nicotine dependence, cigarettes, uncomplicated: Secondary | ICD-10-CM

## 2017-01-07 DIAGNOSIS — M9905 Segmental and somatic dysfunction of pelvic region: Secondary | ICD-10-CM | POA: Diagnosis not present

## 2017-01-07 DIAGNOSIS — M9903 Segmental and somatic dysfunction of lumbar region: Secondary | ICD-10-CM | POA: Diagnosis not present

## 2017-01-07 DIAGNOSIS — M5416 Radiculopathy, lumbar region: Secondary | ICD-10-CM | POA: Diagnosis not present

## 2017-01-07 DIAGNOSIS — M5136 Other intervertebral disc degeneration, lumbar region: Secondary | ICD-10-CM | POA: Diagnosis not present

## 2017-01-08 ENCOUNTER — Other Ambulatory Visit: Payer: Self-pay | Admitting: Primary Care

## 2017-01-08 DIAGNOSIS — R6889 Other general symptoms and signs: Secondary | ICD-10-CM

## 2017-01-09 DIAGNOSIS — M9905 Segmental and somatic dysfunction of pelvic region: Secondary | ICD-10-CM | POA: Diagnosis not present

## 2017-01-09 DIAGNOSIS — M5416 Radiculopathy, lumbar region: Secondary | ICD-10-CM | POA: Diagnosis not present

## 2017-01-09 DIAGNOSIS — M5136 Other intervertebral disc degeneration, lumbar region: Secondary | ICD-10-CM | POA: Diagnosis not present

## 2017-01-09 DIAGNOSIS — M9903 Segmental and somatic dysfunction of lumbar region: Secondary | ICD-10-CM | POA: Diagnosis not present

## 2017-01-10 DIAGNOSIS — M5416 Radiculopathy, lumbar region: Secondary | ICD-10-CM | POA: Diagnosis not present

## 2017-01-10 DIAGNOSIS — M9905 Segmental and somatic dysfunction of pelvic region: Secondary | ICD-10-CM | POA: Diagnosis not present

## 2017-01-10 DIAGNOSIS — M9903 Segmental and somatic dysfunction of lumbar region: Secondary | ICD-10-CM | POA: Diagnosis not present

## 2017-01-10 DIAGNOSIS — M5136 Other intervertebral disc degeneration, lumbar region: Secondary | ICD-10-CM | POA: Diagnosis not present

## 2017-01-14 DIAGNOSIS — M5416 Radiculopathy, lumbar region: Secondary | ICD-10-CM | POA: Diagnosis not present

## 2017-01-14 DIAGNOSIS — M9905 Segmental and somatic dysfunction of pelvic region: Secondary | ICD-10-CM | POA: Diagnosis not present

## 2017-01-14 DIAGNOSIS — M9903 Segmental and somatic dysfunction of lumbar region: Secondary | ICD-10-CM | POA: Diagnosis not present

## 2017-01-14 DIAGNOSIS — M5136 Other intervertebral disc degeneration, lumbar region: Secondary | ICD-10-CM | POA: Diagnosis not present

## 2017-01-16 DIAGNOSIS — M9903 Segmental and somatic dysfunction of lumbar region: Secondary | ICD-10-CM | POA: Diagnosis not present

## 2017-01-16 DIAGNOSIS — M5416 Radiculopathy, lumbar region: Secondary | ICD-10-CM | POA: Diagnosis not present

## 2017-01-16 DIAGNOSIS — M5136 Other intervertebral disc degeneration, lumbar region: Secondary | ICD-10-CM | POA: Diagnosis not present

## 2017-01-16 DIAGNOSIS — M9905 Segmental and somatic dysfunction of pelvic region: Secondary | ICD-10-CM | POA: Diagnosis not present

## 2017-01-17 DIAGNOSIS — M5416 Radiculopathy, lumbar region: Secondary | ICD-10-CM | POA: Diagnosis not present

## 2017-01-17 DIAGNOSIS — M9903 Segmental and somatic dysfunction of lumbar region: Secondary | ICD-10-CM | POA: Diagnosis not present

## 2017-01-17 DIAGNOSIS — M5136 Other intervertebral disc degeneration, lumbar region: Secondary | ICD-10-CM | POA: Diagnosis not present

## 2017-01-17 DIAGNOSIS — M9905 Segmental and somatic dysfunction of pelvic region: Secondary | ICD-10-CM | POA: Diagnosis not present

## 2017-01-21 ENCOUNTER — Ambulatory Visit
Admission: RE | Admit: 2017-01-21 | Discharge: 2017-01-21 | Disposition: A | Discharge status: Home / Self Care | Payer: Medicare Other | Source: Ambulatory Visit | Attending: Acute Care | Admitting: Acute Care

## 2017-01-21 DIAGNOSIS — F17210 Nicotine dependence, cigarettes, uncomplicated: Secondary | ICD-10-CM | POA: Diagnosis not present

## 2017-01-21 DIAGNOSIS — Z122 Encounter for screening for malignant neoplasm of respiratory organs: Secondary | ICD-10-CM | POA: Insufficient documentation

## 2017-01-21 DIAGNOSIS — I700 Atherosclerosis of aorta: Secondary | ICD-10-CM | POA: Insufficient documentation

## 2017-01-21 DIAGNOSIS — Q676 Pectus excavatum: Secondary | ICD-10-CM | POA: Diagnosis not present

## 2017-01-21 DIAGNOSIS — J432 Centrilobular emphysema: Secondary | ICD-10-CM | POA: Insufficient documentation

## 2017-01-21 DIAGNOSIS — I2510 Atherosclerotic heart disease of native coronary artery without angina pectoris: Secondary | ICD-10-CM | POA: Diagnosis not present

## 2017-01-22 ENCOUNTER — Ambulatory Visit (INDEPENDENT_AMBULATORY_CARE_PROVIDER_SITE_OTHER): Payer: Medicare Other | Admitting: Vascular Surgery

## 2017-01-22 ENCOUNTER — Encounter (INDEPENDENT_AMBULATORY_CARE_PROVIDER_SITE_OTHER): Payer: Self-pay | Admitting: Vascular Surgery

## 2017-01-22 VITALS — BP 138/82 | HR 80 | Resp 17 | Ht 76.0 in | Wt 200.6 lb

## 2017-01-22 DIAGNOSIS — F17210 Nicotine dependence, cigarettes, uncomplicated: Secondary | ICD-10-CM

## 2017-01-22 DIAGNOSIS — Z720 Tobacco use: Secondary | ICD-10-CM | POA: Diagnosis not present

## 2017-01-22 DIAGNOSIS — M5416 Radiculopathy, lumbar region: Secondary | ICD-10-CM | POA: Diagnosis not present

## 2017-01-22 DIAGNOSIS — M9903 Segmental and somatic dysfunction of lumbar region: Secondary | ICD-10-CM | POA: Diagnosis not present

## 2017-01-22 DIAGNOSIS — M5136 Other intervertebral disc degeneration, lumbar region: Secondary | ICD-10-CM | POA: Diagnosis not present

## 2017-01-22 DIAGNOSIS — E785 Hyperlipidemia, unspecified: Secondary | ICD-10-CM

## 2017-01-22 DIAGNOSIS — I70219 Atherosclerosis of native arteries of extremities with intermittent claudication, unspecified extremity: Secondary | ICD-10-CM | POA: Insufficient documentation

## 2017-01-22 DIAGNOSIS — M9905 Segmental and somatic dysfunction of pelvic region: Secondary | ICD-10-CM | POA: Diagnosis not present

## 2017-01-22 DIAGNOSIS — I70213 Atherosclerosis of native arteries of extremities with intermittent claudication, bilateral legs: Secondary | ICD-10-CM | POA: Diagnosis not present

## 2017-01-22 NOTE — Assessment & Plan Note (Signed)
lipid control important in reducing the progression of atherosclerotic disease. Intolerant to statins.  Rx for Lopid today.

## 2017-01-22 NOTE — Assessment & Plan Note (Signed)
He had noninvasive studies performed recently which I independently reviewed. He has moderately reduced ABIs bilaterally the left reduced moderate to severely. There is suggestion of inflow disease on the right and femoral to popliteal and tibial disease bilaterally.   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  The patient does voice lifestyle limiting changes at this point in time. He does have claudication which is somewhat limiting, but he would like to try several months of conservative therapy with compression stockings, exercise, and attempt at smoking cessation and optimized medical management. I think that is reasonable at this point as he does have a stable and non-limb threatening situation. No invasive studies, angiography or surgery at this time The patient should continue walking and begin a more formal exercise program.  The patient should continue antiplatelet therapy and aggressive treatment of the lipid abnormalities. I have added Lopid and Plavix today.   The patient should return in 3 months with noninvasive studies to reassess his situation and determine his response to conservative therapy.

## 2017-01-22 NOTE — Patient Instructions (Signed)
Peripheral Vascular Disease Peripheral vascular disease (PVD) is a disease of the blood vessels that are not part of your heart and brain. A simple term for PVD is poor circulation. In most cases, PVD narrows the blood vessels that carry blood from your heart to the rest of your body. This can result in a decreased supply of blood to your arms, legs, and internal organs, like your stomach or kidneys. However, it most often affects a person's lower legs and feet. There are two types of PVD.  Organic PVD. This is the more common type. It is caused by damage to the structure of blood vessels.  Functional PVD. This is caused by conditions that make blood vessels contract and tighten (spasm).  Without treatment, PVD tends to get worse over time. PVD can also lead to acute ischemic limb. This is when an arm or limb suddenly has trouble getting enough blood. This is a medical emergency. What are the causes? Each type of PVD has many different causes. The most common cause of PVD is buildup of a fatty material (plaque) inside of your arteries (atherosclerosis). Small amounts of plaque can break off from the walls of the blood vessels and become lodged in a smaller artery. This blocks blood flow and can cause acute ischemic limb. Other common causes of PVD include:  Blood clots that form inside of blood vessels.  Injuries to blood vessels.  Diseases that cause inflammation of blood vessels or cause blood vessel spasms.  Health behaviors and health history that increase your risk of developing PVD.  What increases the risk? You may have a greater risk of PVD if you:  Have a family history of PVD.  Have certain medical conditions, including: ? High cholesterol. ? Diabetes. ? High blood pressure (hypertension). ? Coronary heart disease. ? Past problems with blood clots. ? Past injury, such as burns or a broken bone. These may have damaged blood vessels in your limbs. ? Buerger disease. This is  caused by inflamed blood vessels in your hands and feet. ? Some forms of arthritis. ? Rare birth defects that affect the arteries in your legs.  Use tobacco.  Do not get enough exercise.  Are obese.  Are age 50 or older.  What are the signs or symptoms? PVD may cause many different symptoms. Your symptoms depend on what part of your body is not getting enough blood. Some common signs and symptoms include:  Cramps in your lower legs. This may be a symptom of poor leg circulation (claudication).  Pain and weakness in your legs while you are physically active that goes away when you rest (intermittent claudication).  Leg pain when at rest.  Leg numbness, tingling, or weakness.  Coldness in a leg or foot, especially when compared with the other leg.  Skin or hair changes. These can include: ? Hair loss. ? Shiny skin. ? Pale or bluish skin. ? Thick toenails.  Inability to get or maintain an erection (erectile dysfunction).  People with PVD are more prone to developing ulcers and sores on their toes, feet, or legs. These may take longer than normal to heal. How is this diagnosed? Your health care provider may diagnose PVD from your signs and symptoms. The health care provider will also do a physical exam. You may have tests to find out what is causing your PVD and determine its severity. Tests may include:  Blood pressure recordings from your arms and legs and measurements of the strength of your pulses (  pulse volume recordings).  Imaging studies using sound waves to take pictures of the blood flow through your blood vessels (Doppler ultrasound).  Injecting a dye into your blood vessels before having imaging studies using: ? X-rays (angiogram or arteriogram). ? Computer-generated X-rays (CT angiogram). ? A powerful electromagnetic field and a computer (magnetic resonance angiogram or MRA).  How is this treated? Treatment for PVD depends on the cause of your condition and the  severity of your symptoms. It also depends on your age. Underlying causes need to be treated and controlled. These include long-lasting (chronic) conditions, such as diabetes, high cholesterol, and high blood pressure. You may need to first try making lifestyle changes and taking medicines. Surgery may be needed if these do not work. Lifestyle changes may include:  Quitting smoking.  Exercising regularly.  Following a low-fat, low-cholesterol diet.  Medicines may include:  Blood thinners to prevent blood clots.  Medicines to improve blood flow.  Medicines to improve your blood cholesterol levels.  Surgical procedures may include:  A procedure that uses an inflated balloon to open a blocked artery and improve blood flow (angioplasty).  A procedure to put in a tube (stent) to keep a blocked artery open (stent implant).  Surgery to reroute blood flow around a blocked artery (peripheral bypass surgery).  Surgery to remove dead tissue from an infected wound on the affected limb.  Amputation. This is surgical removal of the affected limb. This may be necessary in cases of acute ischemic limb that are not improved through medical or surgical treatments.  Follow these instructions at home:  Take medicines only as directed by your health care provider.  Do not use any tobacco products, including cigarettes, chewing tobacco, or electronic cigarettes. If you need help quitting, ask your health care provider.  Lose weight if you are overweight, and maintain a healthy weight as directed by your health care provider.  Eat a diet that is low in fat and cholesterol. If you need help, ask your health care provider.  Exercise regularly. Ask your health care provider to suggest some good activities for you.  Use compression stockings or other mechanical devices as directed by your health care provider.  Take good care of your feet. ? Wear comfortable shoes that fit well. ? Check your feet  often for any cuts or sores. Contact a health care provider if:  You have cramps in your legs while walking.  You have leg pain when you are at rest.  You have coldness in a leg or foot.  Your skin changes.  You have erectile dysfunction.  You have cuts or sores on your feet that are not healing. Get help right away if:  Your arm or leg turns cold and blue.  Your arms or legs become red, warm, swollen, painful, or numb.  You have chest pain or trouble breathing.  You suddenly have weakness in your face, arm, or leg.  You become very confused or lose the ability to speak.  You suddenly have a very bad headache or lose your vision. This information is not intended to replace advice given to you by your health care provider. Make sure you discuss any questions you have with your health care provider. Document Released: 08/16/2004 Document Revised: 12/15/2015 Document Reviewed: 12/17/2013 Elsevier Interactive Patient Education  2017 [employer]  

## 2017-01-22 NOTE — Progress Notes (Signed)
Patient ID: Nathaniel [name], male   DOB: 1950/10/05, 66 y.o.   MRN: [id number]  Chief Complaint  Patient presents with  . New Evaluation    abn abi    HPI Nathaniel [name] is a 66 y.o. male.  I am asked to see the patient by Dr. 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  The patient reports worsening symptoms over at least the last couple of years. His cramping and pain in his calves and thighs with activity that is generally relieved with rest. This has been steadily progressive problem without a clear inciting event or causative factor. He denies trauma or injury. This pain is worse in the left lower extremity than the right lower extremity. The pain has become debilitating enough that he can now only walk about 100 feet without having to stop. This is clearly limiting his lifestyle and become very bothersome to him. He has tried statins previously but had intolerance to them. He is on an aspirin daily. He has smoked for many years and up to this point has been unable to quit. He denies ulcerations or tissue loss. He denies fever or chills. He had noninvasive studies performed recently which I independently reviewed. He has moderately reduced ABIs bilaterally the left reduced moderate to severely. There is suggestion of inflow disease on the right and femoral to popliteal and tibial disease bilaterally. He is referred for further evaluation and treatment.   Past Medical History:  Diagnosis Date  . Chicken pox   . GERD (gastroesophageal reflux disease)   . Hyperlipidemia   . Seizures ([place])    None since age 20    Past Surgical History:  Procedure Laterality Date  . LAMINECTOMY  1987  . TONSILLECTOMY      Family History  Problem Relation Age of Onset  . Heart disease Mother        Arrthymia   . Stroke Father        Deceased  . Dementia Father   . Heart disease Brother        Myocardial infarction  . Colon cancer Neg Hx     Social  History Social History  Substance Use Topics  . Smoking status: Current Every Day Smoker    Packs/day: 1.00    Years: 50.00    Types: Cigarettes  . Smokeless tobacco: Never Used  . Alcohol use No  No IVDU Married  Allergies  Allergen Reactions  . Prozac [Fluoxetine Hcl] Other (See Comments)    Extreme depression  Intolerance to statins with myalgias.  Current Outpatient Prescriptions  Medication Sig Dispense Refill  . aspirin 81 MG tablet Take 81 mg by mouth daily.    . budesonide-formoterol (SYMBICORT) 160-4.5 MCG/ACT inhaler Inhale 2 puffs into the lungs 2 (two) times daily. 1 Inhaler 1  . clonazePAM (KLONOPIN) 0.5 MG tablet Take 1/2 tablet by mouth daily for three days as needed for tremors. 15 tablet 0  . esomeprazole (NEXIUM) 20 MG capsule Take 20 mg by mouth daily at 12 noon.    Marland Kitchen ibuprofen (ADVIL,MOTRIN) 200 MG tablet Take 200 mg by mouth as needed.    Marland Kitchen tiZANidine (ZANAFLEX) 2 MG tablet Take 1 tablet (2 mg total) by mouth 2 (two) times daily as needed for muscle spasms. 30 tablet 2   No current facility-administered medications for this visit.       REVIEW OF SYSTEMS (Negative unless checked)  Constitutional: [] Weight loss  [] Fever  [] Chills Cardiac: [] Chest  pain   [] Chest pressure   [] Palpitations   [] Shortness of breath when laying flat   [] Shortness of breath at rest   [] Shortness of breath with exertion. Vascular:  [x] Pain in legs with walking   [] Pain in legs at rest   [] Pain in legs when laying flat   [x] Claudication   [] Pain in feet when walking  [] Pain in feet at rest  [] Pain in feet when laying flat   [] History of DVT   [] Phlebitis   [] Swelling in legs   [] Varicose veins   [] Non-healing ulcers Pulmonary:   [] Uses home oxygen   [] Productive cough   [] Hemoptysis   [] Wheeze  [] COPD   [] Asthma Neurologic:  [x] Dizziness  [] Blackouts   [x] Seizures   [] History of stroke   [] History of TIA  [] Aphasia   [] Temporary blindness   [] Dysphagia   [] Weakness or numbness in arms    [] Weakness or numbness in legs Musculoskeletal:  [x] Arthritis   [] Joint swelling   [] Joint pain   [] Low back pain Hematologic:  [] Easy bruising  [] Easy bleeding   [] Hypercoagulable state   [] Anemic  [] Hepatitis Gastrointestinal:  [] Blood in stool   [] Vomiting blood  [] Gastroesophageal reflux/heartburn   [] Abdominal pain Genitourinary:  [] Chronic kidney disease   [] Difficult urination  [] Frequent urination  [] Burning with urination   [] Hematuria Skin:  [] Rashes   [] Ulcers   [] Wounds Psychological:  [] History of anxiety   []  History of major depression.    Physical Exam BP 138/82   Pulse 80   Resp 17   Ht 6\' 4"  (1.93 m)   Wt 91 kg (200 lb 9.6 oz)   BMI 24.42 kg/m  Gen:  WD/WN, NAD [place]: [place]/AT, No temporalis wasting.  Ear/Nose/Throat: Hearing grossly intact, nares w/o erythema or drainage, oropharynx w/o Erythema/Exudate Eyes: Conjunctiva clear, sclera non-icteric  Neck: trachea midline.  No JVD.  Pulmonary:  Good air movement, respirations not labored, no use of accessory muscles.  Cardiac: RRR, normal S1, S2 Vascular:  Vessel Right Left  Radial Palpable Palpable                      Popliteal Not Palpable Not Palpable  PT 1+ Palpable Not Palpable  DP 1+ Palpable 1+ Palpable   Gastrointestinal: soft, non-tender/non-distended.  Musculoskeletal: M/S 5/5 throughout.  Extremities without ischemic changes.  No deformity or atrophy. No edema. Neurologic: Sensation grossly intact in extremities.  Symmetrical.  Speech is fluent. Motor exam as listed above. Psychiatric: Judgment intact, Mood & affect appropriate for pt's clinical situation. Dermatologic: No rashes or ulcers noted.  No cellulitis or open wounds.    Radiology Ct Chest Lung Ca Screen Low Dose W/o Cm  Result Date: 01/21/2017 CLINICAL DATA:  Asymptomatic current smoker with 55 pack-year history. EXAM: CT CHEST WITHOUT CONTRAST LOW-DOSE FOR LUNG CANCER SCREENING TECHNIQUE: Multidetector CT imaging of the chest was  performed following the standard protocol without IV contrast. COMPARISON:  11/27/2016 chest radiograph. Most recent screening CT 01/20/2016. FINDINGS: Cardiovascular: Tortuous thoracic aorta. Aortic atherosclerosis. Normal heart size, without pericardial effusion. Lad coronary artery atherosclerosis. Mediastinum/Nodes: No mediastinal or definite hilar adenopathy, given limitations of unenhanced CT. Lungs/Pleura: No pleural fluid. Mild centrilobular emphysema. Right base scarring or subsegmental atelectasis. Pulmonary nodules of maximally volume derived equivalent diameter 4.6 mm are not significantly changed. No new or enlarging pulmonary nodules identified. Upper Abdomen: Normal imaged portions of the liver, spleen, stomach, pancreas, adrenal glands, kidneys. Musculoskeletal: Mild pectus excavatum deformity. Mild superior endplate compression deformity at L1  is not significantly changed. IMPRESSION: 1. Lung-RADS 2, benign appearance or behavior. Continue annual screening with low-dose chest CT without contrast in 12 months. 2. Coronary artery atherosclerosis. Aortic Atherosclerosis (ICD10-I70.0). 3.  Emphysema (ICD10-J43.9). Electronically Signed   By: Abigail Miyamoto M.D.   On: 01/21/2017 14:17    Labs Recent Results (from the past 2160 hour(s))  CBC     Status: Abnormal   Collection Time: 11/23/16  2:38 PM  Result Value Ref Range   WBC 12.6 (H) 3.8 - 10.8 K/uL   RBC 4.76 4.20 - 5.80 MIL/uL   Hemoglobin 14.6 13.2 - 17.1 g/dL   HCT 43.8 38.5 - 50.0 %   MCV 92.0 80.0 - 100.0 fL   MCH 30.7 27.0 - 33.0 pg   MCHC 33.3 32.0 - 36.0 g/dL   RDW 13.6 11.0 - 15.0 %   Platelets 301 140 - 400 K/uL   MPV 11.1 7.5 - 12.5 fL  TSH     Status: None   Collection Time: 11/23/16  2:38 PM  Result Value Ref Range   TSH 1.08 0.40 - 4.50 mIU/L  Hemoglobin A1c     Status: None   Collection Time: 11/23/16  2:38 PM  Result Value Ref Range   Hgb A1c MFr Bld 5.5 <5.7 %    Comment:   For the purpose of screening for the  presence of diabetes:   <5.7%       Consistent with the absence of diabetes 5.7-6.4 %   Consistent with increased risk for diabetes (prediabetes) >=6.5 %     Consistent with diabetes   This assay result is consistent with a decreased risk of diabetes.   Currently, no consensus exists regarding use of hemoglobin A1c for diagnosis of diabetes in children.   According to American Diabetes Association (ADA) guidelines, hemoglobin A1c <7.0% represents optimal control in non-pregnant diabetic patients. Different metrics may apply to specific patient populations. Standards of Medical Care in Diabetes (ADA).      Mean Plasma Glucose 111 mg/dL  Lyme Ab/Western Blot Reflex     Status: None   Collection Time: 11/23/16  2:38 PM  Result Value Ref Range   B burgdorferi Ab IgG+IgM <0.90 <0.90 Index    Comment:                      Index                Interpretation                    -----                --------------                    < 0.90               NEGATIVE                    0.90-1.09            EQUIVOCAL                    > 1.09               POSITIVE   As recommended by the Food and Drug Administration (FDA), all samples with positive or equivocal results on the B. burgdorferi antibody screen will be tested by Western Blot/Immunoblot. Positive or equivocal screening test results should  not be interpreted as truly positive until verified as such using a supplemental assay (e.g., B. burgdorferi blot).   The screening test and/or blot for B. burgdorferi antibodies may be falsely negative in early stages of Lyme disease, including the period when erythema migrans is apparent.       Assessment/Plan:  Hyperlipidemia lipid control important in reducing the progression of atherosclerotic disease. Intolerant to statins.  Rx for Lopid today.  Tobacco abuse We had a discussion for approximately 5 minutes regarding the absolute need for smoking cessation due to the deleterious  nature of tobacco on the vascular system. We discussed the tobacco use would diminish patency of any intervention, and likely significantly worsen progressio of disease. We discussed multiple agents for quitting including replacement therapy or medications to reduce cravings such as Chantix. The patient voices their understanding of the importance of smoking cessation. Rx for Chantix given today.   Atherosclerosis of native arteries of extremity with intermittent claudication ([place]) He had noninvasive studies performed recently which I independently reviewed. He has moderately reduced ABIs bilaterally the left reduced moderate to severely. There is suggestion of inflow disease on the right and femoral to popliteal and tibial disease bilaterally.   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  The patient does voice lifestyle limiting changes at this point in time. He does have claudication which is somewhat limiting, but he would like to try several months of conservative therapy with compression stockings, exercise, and attempt at smoking cessation and optimized medical management. I think that is reasonable at this point as he does have a stable and non-limb threatening situation. No invasive studies, angiography or surgery at this time The patient should continue walking and begin a more formal exercise program.  The patient should continue antiplatelet therapy and aggressive treatment of the lipid abnormalities. I have added Lopid and Plavix today.   The patient should return in 3 months with noninvasive studies to reassess his situation and determine his response to conservative therapy.      Leotis Pain 01/22/2017, 4:50 PM   This note was created with Dragon medical transcription system.  Any errors from dictation are unintentional.

## 2017-01-22 NOTE — Assessment & Plan Note (Signed)
We had a discussion for approximately 5 minutes regarding the absolute need for smoking cessation due to the deleterious nature of tobacco on the vascular system. We discussed the tobacco use would diminish patency of any intervention, and likely significantly worsen progressio of disease. We discussed multiple agents for quitting including replacement therapy or medications to reduce cravings such as Chantix. The patient voices their understanding of the importance of smoking cessation. Rx for Chantix given today.

## 2017-01-24 ENCOUNTER — Other Ambulatory Visit: Payer: Self-pay | Admitting: Acute Care

## 2017-01-24 DIAGNOSIS — M9903 Segmental and somatic dysfunction of lumbar region: Secondary | ICD-10-CM | POA: Diagnosis not present

## 2017-01-24 DIAGNOSIS — M5136 Other intervertebral disc degeneration, lumbar region: Secondary | ICD-10-CM | POA: Diagnosis not present

## 2017-01-24 DIAGNOSIS — F17210 Nicotine dependence, cigarettes, uncomplicated: Principal | ICD-10-CM

## 2017-01-24 DIAGNOSIS — M9905 Segmental and somatic dysfunction of pelvic region: Secondary | ICD-10-CM | POA: Diagnosis not present

## 2017-01-24 DIAGNOSIS — M5416 Radiculopathy, lumbar region: Secondary | ICD-10-CM | POA: Diagnosis not present

## 2017-01-28 DIAGNOSIS — C44612 Basal cell carcinoma of skin of right upper limb, including shoulder: Secondary | ICD-10-CM | POA: Diagnosis not present

## 2017-01-28 DIAGNOSIS — Z85820 Personal history of malignant melanoma of skin: Secondary | ICD-10-CM | POA: Diagnosis not present

## 2017-01-28 DIAGNOSIS — D1801 Hemangioma of skin and subcutaneous tissue: Secondary | ICD-10-CM | POA: Diagnosis not present

## 2017-01-28 DIAGNOSIS — L821 Other seborrheic keratosis: Secondary | ICD-10-CM | POA: Diagnosis not present

## 2017-01-28 DIAGNOSIS — D485 Neoplasm of uncertain behavior of skin: Secondary | ICD-10-CM | POA: Diagnosis not present

## 2017-01-28 DIAGNOSIS — Z85828 Personal history of other malignant neoplasm of skin: Secondary | ICD-10-CM | POA: Diagnosis not present

## 2017-01-29 DIAGNOSIS — M9905 Segmental and somatic dysfunction of pelvic region: Secondary | ICD-10-CM | POA: Diagnosis not present

## 2017-01-29 DIAGNOSIS — M5136 Other intervertebral disc degeneration, lumbar region: Secondary | ICD-10-CM | POA: Diagnosis not present

## 2017-01-29 DIAGNOSIS — M5416 Radiculopathy, lumbar region: Secondary | ICD-10-CM | POA: Diagnosis not present

## 2017-01-29 DIAGNOSIS — M9903 Segmental and somatic dysfunction of lumbar region: Secondary | ICD-10-CM | POA: Diagnosis not present

## 2017-01-31 DIAGNOSIS — M5416 Radiculopathy, lumbar region: Secondary | ICD-10-CM | POA: Diagnosis not present

## 2017-01-31 DIAGNOSIS — M9905 Segmental and somatic dysfunction of pelvic region: Secondary | ICD-10-CM | POA: Diagnosis not present

## 2017-01-31 DIAGNOSIS — M5136 Other intervertebral disc degeneration, lumbar region: Secondary | ICD-10-CM | POA: Diagnosis not present

## 2017-01-31 DIAGNOSIS — M9903 Segmental and somatic dysfunction of lumbar region: Secondary | ICD-10-CM | POA: Diagnosis not present

## 2017-02-05 ENCOUNTER — Ambulatory Visit (INDEPENDENT_AMBULATORY_CARE_PROVIDER_SITE_OTHER): Payer: Medicare Other | Admitting: Primary Care

## 2017-02-05 ENCOUNTER — Encounter: Payer: Self-pay | Admitting: Primary Care

## 2017-02-05 VITALS — BP 146/84 | HR 78 | Temp 98.1°F | Ht 76.0 in | Wt 198.8 lb

## 2017-02-05 DIAGNOSIS — R251 Tremor, unspecified: Secondary | ICD-10-CM

## 2017-02-05 DIAGNOSIS — Z0000 Encounter for general adult medical examination without abnormal findings: Secondary | ICD-10-CM

## 2017-02-05 DIAGNOSIS — K219 Gastro-esophageal reflux disease without esophagitis: Secondary | ICD-10-CM

## 2017-02-05 DIAGNOSIS — Z0001 Encounter for general adult medical examination with abnormal findings: Secondary | ICD-10-CM

## 2017-02-05 DIAGNOSIS — Z720 Tobacco use: Secondary | ICD-10-CM | POA: Diagnosis not present

## 2017-02-05 DIAGNOSIS — I70213 Atherosclerosis of native arteries of extremities with intermittent claudication, bilateral legs: Secondary | ICD-10-CM | POA: Diagnosis not present

## 2017-02-05 DIAGNOSIS — R0602 Shortness of breath: Secondary | ICD-10-CM

## 2017-02-05 DIAGNOSIS — E785 Hyperlipidemia, unspecified: Secondary | ICD-10-CM | POA: Diagnosis not present

## 2017-02-05 MED ORDER — BUDESONIDE-FORMOTEROL FUMARATE 160-4.5 MCG/ACT IN AERO: 2.0000 | INHALATION_SPRAY | Freq: Two times a day (BID) | RESPIRATORY_TRACT | 11 refills | Status: DC

## 2017-02-05 NOTE — Progress Notes (Signed)
Patient ID: Nathaniel [name], male   DOB: 1950-08-01, 66 y.o.   MRN: [id number]  HPI: Mr. Mansel is a 66 year old male who presents today for Medicare Wellness Visit.  Past Medical History:  Diagnosis Date  . Chicken pox   . GERD (gastroesophageal reflux disease)   . Hyperlipidemia   . Seizures ([place])    None since age 68    Current Outpatient Prescriptions  Medication Sig Dispense Refill  . aspirin 81 MG tablet Take 81 mg by mouth daily.    . budesonide-formoterol (SYMBICORT) 160-4.5 MCG/ACT inhaler Inhale 2 puffs into the lungs 2 (two) times daily. 1 Inhaler 1  . clonazePAM (KLONOPIN) 0.5 MG tablet Take 1/2 tablet by mouth daily for three days as needed for tremors. 15 tablet 0  . clopidogrel (PLAVIX) 75 MG tablet Take 75 mg by mouth daily.     Marland Kitchen esomeprazole (NEXIUM) 20 MG capsule Take 20 mg by mouth daily at 12 noon.    Marland Kitchen gemfibrozil (LOPID) 600 MG tablet Take 600 mg by mouth daily.     Marland Kitchen ibuprofen (ADVIL,MOTRIN) 200 MG tablet Take 200 mg by mouth as needed.    Marland Kitchen tiZANidine (ZANAFLEX) 2 MG tablet Take 1 tablet (2 mg total) by mouth 2 (two) times daily as needed for muscle spasms. 30 tablet 2   No current facility-administered medications for this visit.     Allergies  Allergen Reactions  . Prozac [Fluoxetine Hcl] Other (See Comments)    Extreme depression    Family History  Problem Relation Age of Onset  . Heart disease Mother        Arrthymia   . Stroke Father        Deceased  . Dementia Father   . Heart disease Brother        Myocardial infarction  . Colon cancer Neg Hx     Social History   Social History  . Marital status: Married    Spouse name: N/A  . Number of children: N/A  . Years of education: N/A   Occupational History  . Not on file.   Social History Main Topics  . Smoking status: Current Every Day Smoker    Packs/day: 1.00    Years: 50.00    Types: Cigarettes  . Smokeless tobacco: Never Used  . Alcohol use No  . Drug use: No  . Sexual  activity: Not on file   Other Topics Concern  . Not on file   Social History Narrative   Retired.   Stays busy with household work, Surveyor, mining.   Married for 3 years.   Grown children.   Enjoys reading.     Hospitiliaztions: None  Health Maintenance:    Flu: Declines annually.  Tetanus: Declines.  Pneumovax: Declines.  Prevnar: Declines.  Zostavax: Declines.  Colonoscopy: Completed in 2016  Eye Doctor: Completed 1 year ago.  Dental Exam: Does not complete  PSA: Negative in 2016  Hep C Screening: Due.    Providers: Alma Friendly, PCP; Dr. Lucky Cowboy, Vascular Surgery.   I have personally reviewed and have noted: 1. The patient's medical and social history 2. Their use of alcohol, tobacco or illicit drugs 3. Their current medications and supplements 4. The patient's functional ability including ADL's, fall risks, home safety risks and  hearing or visual impairment. 5. Diet and physical activities 6. Evidence for depression or mood disorder  Subjective:   Review of Systems:   Constitutional: Denies fever, malaise, fatigue, headache or abrupt  weight changes.  HEENT: Denies eye pain, eye redness, ear pain, ringing in the ears, wax buildup, runny nose, nasal congestion, bloody nose, or sore throat. Respiratory: Does experience exertional shortness of breath. Work to wean off of cigarettes. Cardiovascular: Denies chest pain, chest tightness, palpitations or swelling in the hands or feet. Does experience intermittent claudication, working with Vascular Surgery. Gastrointestinal: Denies abdominal pain, bloating, constipation, diarrhea or blood in the stool.  GU: Denies urgency, frequency, pain with urination, burning sensation, blood in urine, odor or discharge. Musculoskeletal: Does experience intermittent claudication with prolonged walking. Following with Vascular Surgery. Skin: Currently following with [facility]. Has had several lesions removed. Neurological:  Occasional dizziness when standing up too fast, some difficulty with memory including difficulty with word finding, denies difficulty with speech or problems with balance and coordination.  Psychiatric: Denies concerns for anxiety or depression.   No other specific complaints in a complete review of systems (except as listed in HPI above).  Objective:  PE:   BP (!) 146/84   Pulse 78   Temp 98.1 F (36.7 C) (Oral)   Ht 6\' 4"  (1.93 m)   Wt 198 lb 12.8 oz (90.2 kg)   SpO2 97%   BMI 24.20 kg/m  Wt Readings from Last 3 Encounters:  02/05/17 198 lb 12.8 oz (90.2 kg)  01/22/17 200 lb 9.6 oz (91 kg)  01/21/17 185 lb (83.9 kg)    General: Appears their stated age, well developed, well nourished in NAD. Skin: Warm, dry and intact. No rashes, lesions or ulcerations noted. HEENT: Head: normal shape and size; Eyes: sclera white, no icterus, conjunctiva pink, PERRLA and EOMs intact; Ears: Tm's gray and intact, normal light reflex; Nose: mucosa pink and moist, septum midline; Throat/Mouth: Teeth present, mucosa pink and moist, no exudate, lesions or ulcerations noted.  Neck: Normal range of motion. Neck supple, trachea midline. No massses, lumps or thyromegaly present.  Cardiovascular: Normal rate and rhythm. S1,S2 noted.  No murmur, rubs or gallops noted. No JVD or BLE edema. Right carotid bruit present, has been evaluated twice. Pulmonary/Chest: Normal effort and positive vesicular breath sounds. No respiratory distress. No wheezes, rales or ronchi noted.  Abdomen: Soft and nontender. Normal bowel sounds, no bruits noted. No distention or masses noted. Liver, spleen and kidneys non palpable. Musculoskeletal: Normal range of motion. No signs of joint swelling. No difficulty with gait.  Neurological: Alert and oriented. Cranial nerves II-XII intact. Coordination normal. +DTRs bilaterally. Psychiatric: Mood and affect normal. Behavior is normal. Judgment and thought content normal.    BMET     Component Value Date/Time   NA 139 01/10/2016 1059   K 4.8 01/10/2016 1059   CL 103 01/10/2016 1059   CO2 31 01/10/2016 1059   GLUCOSE 106 (H) 01/10/2016 1059   BUN 9 01/10/2016 1059   CREATININE 1.10 01/10/2016 1059   CALCIUM 9.6 01/10/2016 1059    Lipid Panel     Component Value Date/Time   CHOL 221 (H) 07/12/2016 0841   TRIG 212.0 (H) 07/12/2016 0841   HDL 39.60 07/12/2016 0841   CHOLHDL 6 07/12/2016 0841   VLDL 42.4 (H) 07/12/2016 0841   LDLCALC 161 (H) 01/10/2016 1059    CBC    Component Value Date/Time   WBC 12.6 (H) 11/23/2016 1438   RBC 4.76 11/23/2016 1438   HGB 14.6 11/23/2016 1438   HCT 43.8 11/23/2016 1438   PLT 301 11/23/2016 1438   MCV 92.0 11/23/2016 1438   MCH 30.7 11/23/2016 1438  MCHC 33.3 11/23/2016 1438   RDW 13.6 11/23/2016 1438    Hgb A1C Lab Results  Component Value Date   HGBA1C 5.5 11/23/2016      Assessment and Plan:   Medicare Annual Wellness Visit:  Diet: He endorses a poor diet. Breakfast: Eggs, bacon, buttered toast with honey Lunch: Sandwhich, chips Dinner: Sometimes skips, pizza, hamburgers, roast, beans Snacks: Nuts, crackers Desserts: Candy occasionally Beverages: Coffee, orange juice, sweet tea, little water consumption Physical activity: Sedentary, active in the house  Depression/mood screen: Negative Hearing: Intact to whispered voice Visual acuity: Grossly normal, performs annual eye exam  ADLs: Capable Fall risk: None Home safety: Good Cognitive evaluation: Intact to orientation, naming, recall and repetition EOL planning: Adv directives, full code, I agree.  Preventative Medicine: Declines all immunizations despite recommendations. PSA UTD, colonoscopy UTD. Exam stable. Labs pending. Advanced directives packet provided. Recommended regular exercise and improvements in diet. All recommendations provided at end of visit.   Next appointment: 1 year or sooner if needed.

## 2017-02-05 NOTE — Assessment & Plan Note (Signed)
Stable on current regimen   

## 2017-02-05 NOTE — Assessment & Plan Note (Signed)
Declines all immunizations despite recommendations. PSA UTD, colonoscopy UTD. Exam stable. Labs pending. Advanced directives packet provided. Recommended regular exercise and improvements in diet. All recommendations provided at end of visit.   I have personally reviewed and have noted: 1. The patient's medical and social history 2. Their use of alcohol, tobacco or illicit drugs 3. Their current medications and supplements 4. The patient's functional ability including ADL's, fall  risks, home safety risks and  hearing or visual  impairment. 5. Diet and physical activities 6. Evidence for depression or mood disorder

## 2017-02-05 NOTE — Assessment & Plan Note (Signed)
ABI's completed several weeks ago for complaints of lower extremity pain. Now following with Vascular Surgery for moderate arterial blockages. Discussed to stop smoking. Managed on Plavix.

## 2017-02-05 NOTE — Assessment & Plan Note (Signed)
Working to wean off cigarettes, commended him on this.

## 2017-02-05 NOTE — Assessment & Plan Note (Signed)
Doing well on PRN clonazepam, no evidence of abuse. No recent refill request.

## 2017-02-05 NOTE — Patient Instructions (Signed)
It's important to improve your diet by reducing consumption of processed snack foods, sugary drinks. Increase consumption of fresh vegetables and fruits, whole grains, water.  Ensure you are drinking 64 ounces of water daily.  Start exercising. You should be getting 150 minutes of exercise weekly. Work slowly up to this as directed by Dr. Lucky Cowboy.  Continue to work on reduction in tobacco use.  Follow up in 1 year for your annual exam or sooner if needed.  Schedule a lab only appointment for re-evaluation of your cholesterol.  It was a pleasure to see you today!

## 2017-02-05 NOTE — Assessment & Plan Note (Signed)
Lipid panel pending, refuses stains due to intolerance. Currently managed on Plavix.

## 2017-02-06 ENCOUNTER — Other Ambulatory Visit: Payer: Medicare Other

## 2017-02-06 DIAGNOSIS — M5136 Other intervertebral disc degeneration, lumbar region: Secondary | ICD-10-CM | POA: Diagnosis not present

## 2017-02-06 DIAGNOSIS — M5416 Radiculopathy, lumbar region: Secondary | ICD-10-CM | POA: Diagnosis not present

## 2017-02-06 DIAGNOSIS — M9903 Segmental and somatic dysfunction of lumbar region: Secondary | ICD-10-CM | POA: Diagnosis not present

## 2017-02-06 DIAGNOSIS — M9905 Segmental and somatic dysfunction of pelvic region: Secondary | ICD-10-CM | POA: Diagnosis not present

## 2017-02-07 ENCOUNTER — Other Ambulatory Visit (INDEPENDENT_AMBULATORY_CARE_PROVIDER_SITE_OTHER): Payer: Medicare Other

## 2017-02-07 DIAGNOSIS — E785 Hyperlipidemia, unspecified: Secondary | ICD-10-CM

## 2017-02-07 LAB — COMPREHENSIVE METABOLIC PANEL
ALBUMIN: 4.2 g/dL (ref 3.5–5.2)
ALT: 12 U/L (ref 0–53)
AST: 14 U/L (ref 0–37)
Alkaline Phosphatase: 107 U/L (ref 39–117)
BUN: 12 mg/dL (ref 6–23)
CHLORIDE: 102 meq/L (ref 96–112)
CO2: 30 meq/L (ref 19–32)
CREATININE: 1.17 mg/dL (ref 0.40–1.50)
Calcium: 9.6 mg/dL (ref 8.4–10.5)
GFR: 65.88 mL/min (ref >60.00)
GLUCOSE: 99 mg/dL (ref 70–99)
Potassium: 4.4 mEq/L (ref 3.5–5.1)
SODIUM: 137 meq/L (ref 135–145)
Total Bilirubin: 0.5 mg/dL (ref 0.2–1.2)
Total Protein: 7 g/dL (ref 6.0–8.3)

## 2017-02-07 LAB — LIPID PANEL
CHOL/HDL RATIO: 5
Cholesterol: 199 mg/dL (ref 0–200)
HDL: 40.6 mg/dL (ref >39.00)
LDL CALC: 139 mg/dL — AB (ref 0–99)
NonHDL: 157.97
Triglycerides: 93 mg/dL (ref 0.0–149.0)
VLDL: 18.6 mg/dL (ref 0.0–40.0)

## 2017-02-11 DIAGNOSIS — C44612 Basal cell carcinoma of skin of right upper limb, including shoulder: Secondary | ICD-10-CM | POA: Diagnosis not present

## 2017-02-13 DIAGNOSIS — M9903 Segmental and somatic dysfunction of lumbar region: Secondary | ICD-10-CM | POA: Diagnosis not present

## 2017-02-13 DIAGNOSIS — M5136 Other intervertebral disc degeneration, lumbar region: Secondary | ICD-10-CM | POA: Diagnosis not present

## 2017-02-13 DIAGNOSIS — M9905 Segmental and somatic dysfunction of pelvic region: Secondary | ICD-10-CM | POA: Diagnosis not present

## 2017-02-13 DIAGNOSIS — M5416 Radiculopathy, lumbar region: Secondary | ICD-10-CM | POA: Diagnosis not present

## 2017-02-20 DIAGNOSIS — M9905 Segmental and somatic dysfunction of pelvic region: Secondary | ICD-10-CM | POA: Diagnosis not present

## 2017-02-20 DIAGNOSIS — M5136 Other intervertebral disc degeneration, lumbar region: Secondary | ICD-10-CM | POA: Diagnosis not present

## 2017-02-20 DIAGNOSIS — M9903 Segmental and somatic dysfunction of lumbar region: Secondary | ICD-10-CM | POA: Diagnosis not present

## 2017-02-20 DIAGNOSIS — M5416 Radiculopathy, lumbar region: Secondary | ICD-10-CM | POA: Diagnosis not present

## 2017-02-27 DIAGNOSIS — M9905 Segmental and somatic dysfunction of pelvic region: Secondary | ICD-10-CM | POA: Diagnosis not present

## 2017-02-27 DIAGNOSIS — M5416 Radiculopathy, lumbar region: Secondary | ICD-10-CM | POA: Diagnosis not present

## 2017-02-27 DIAGNOSIS — M5136 Other intervertebral disc degeneration, lumbar region: Secondary | ICD-10-CM | POA: Diagnosis not present

## 2017-02-27 DIAGNOSIS — M9903 Segmental and somatic dysfunction of lumbar region: Secondary | ICD-10-CM | POA: Diagnosis not present

## 2017-03-06 DIAGNOSIS — M9905 Segmental and somatic dysfunction of pelvic region: Secondary | ICD-10-CM | POA: Diagnosis not present

## 2017-03-06 DIAGNOSIS — M9903 Segmental and somatic dysfunction of lumbar region: Secondary | ICD-10-CM | POA: Diagnosis not present

## 2017-03-06 DIAGNOSIS — M5136 Other intervertebral disc degeneration, lumbar region: Secondary | ICD-10-CM | POA: Diagnosis not present

## 2017-03-06 DIAGNOSIS — M5416 Radiculopathy, lumbar region: Secondary | ICD-10-CM | POA: Diagnosis not present

## 2017-03-17 DIAGNOSIS — S0502XA Injury of conjunctiva and corneal abrasion without foreign body, left eye, initial encounter: Secondary | ICD-10-CM | POA: Diagnosis not present

## 2017-03-17 DIAGNOSIS — H1032 Unspecified acute conjunctivitis, left eye: Secondary | ICD-10-CM | POA: Diagnosis not present

## 2017-03-20 DIAGNOSIS — M5136 Other intervertebral disc degeneration, lumbar region: Secondary | ICD-10-CM | POA: Diagnosis not present

## 2017-03-20 DIAGNOSIS — M9905 Segmental and somatic dysfunction of pelvic region: Secondary | ICD-10-CM | POA: Diagnosis not present

## 2017-03-20 DIAGNOSIS — M5416 Radiculopathy, lumbar region: Secondary | ICD-10-CM | POA: Diagnosis not present

## 2017-03-20 DIAGNOSIS — M9903 Segmental and somatic dysfunction of lumbar region: Secondary | ICD-10-CM | POA: Diagnosis not present

## 2017-04-11 DIAGNOSIS — M9905 Segmental and somatic dysfunction of pelvic region: Secondary | ICD-10-CM | POA: Diagnosis not present

## 2017-04-11 DIAGNOSIS — M9903 Segmental and somatic dysfunction of lumbar region: Secondary | ICD-10-CM | POA: Diagnosis not present

## 2017-04-11 DIAGNOSIS — M5136 Other intervertebral disc degeneration, lumbar region: Secondary | ICD-10-CM | POA: Diagnosis not present

## 2017-04-11 DIAGNOSIS — M5416 Radiculopathy, lumbar region: Secondary | ICD-10-CM | POA: Diagnosis not present

## 2017-04-22 DIAGNOSIS — I70248 Atherosclerosis of native arteries of left leg with ulceration of other part of lower left leg: Secondary | ICD-10-CM | POA: Diagnosis not present

## 2017-04-26 ENCOUNTER — Ambulatory Visit (INDEPENDENT_AMBULATORY_CARE_PROVIDER_SITE_OTHER): Payer: Medicare Other | Admitting: Vascular Surgery

## 2017-04-26 ENCOUNTER — Encounter (INDEPENDENT_AMBULATORY_CARE_PROVIDER_SITE_OTHER): Payer: Self-pay | Admitting: Vascular Surgery

## 2017-04-26 ENCOUNTER — Ambulatory Visit (INDEPENDENT_AMBULATORY_CARE_PROVIDER_SITE_OTHER): Payer: Medicare Other

## 2017-04-26 VITALS — BP 153/88 | HR 73 | Resp 16 | Ht 76.0 in | Wt 199.0 lb

## 2017-04-26 DIAGNOSIS — E785 Hyperlipidemia, unspecified: Secondary | ICD-10-CM

## 2017-04-26 DIAGNOSIS — I70213 Atherosclerosis of native arteries of extremities with intermittent claudication, bilateral legs: Secondary | ICD-10-CM

## 2017-04-26 DIAGNOSIS — I70212 Atherosclerosis of native arteries of extremities with intermittent claudication, left leg: Secondary | ICD-10-CM | POA: Diagnosis not present

## 2017-04-26 DIAGNOSIS — Z720 Tobacco use: Secondary | ICD-10-CM | POA: Diagnosis not present

## 2017-04-26 NOTE — Progress Notes (Signed)
Subjective:    Patient ID: Nathaniel [name], male    DOB: [birth year], 66 y.o.   MRN: [id number] Chief Complaint  Patient presents with  . Follow-up    3 mos. ABI   Patient presents for a 3 month follow-up to review vascular studies. The patient presents with a chief complaint of worsening left lower extremity claudication. Patient had to cancel a vacation due to his inability to walk long distances. He reports a interval shortening in his claudication distance. Patient denies any rest pain or ulceration to the lower extremity. Patient underwent an ABI which was notable for no evidence of right lower extremity arterial disease. Left ankle brachial index suggests moderate arterial occlusive disease. Patient states his symptoms have progressed to the point he is unable to function on a daily basis. Patient denies any fever, nausea or vomiting.   Review of Systems  Constitutional: Negative.   HENT: Negative.   Eyes: Negative.   Respiratory: Negative.   Cardiovascular:       Left lower extremity claudication  Gastrointestinal: Negative.   Endocrine: Negative.   Genitourinary: Negative.   Musculoskeletal: Negative.   Skin: Negative.   Allergic/Immunologic: Negative.   Neurological: Negative.   Hematological: Negative.   Psychiatric/Behavioral: Negative.       Objective:   Physical Exam  Constitutional: He is oriented to person, place, and time. He appears well-developed and well-nourished. No distress.  HENT:  Head: Normocephalic and atraumatic.  Eyes: Pupils are equal, round, and reactive to light. Conjunctivae are normal.  Neck: Normal range of motion.  Cardiovascular: Normal rate, regular rhythm, normal heart sounds and intact distal pulses.   Pulses:      Radial pulses are 2+ on the right side, and 2+ on the left side.       Dorsalis pedis pulses are 2+ on the right side, and 1+ on the left side.       Posterior tibial pulses are 2+ on the right side, and 1+ on the left  side.  Pulmonary/Chest: Effort normal.  Musculoskeletal: Normal range of motion. He exhibits no edema.  Neurological: He is alert and oriented to person, place, and time.  Skin: Skin is warm and dry. He is not diaphoretic.  Psychiatric: He has a normal mood and affect. His behavior is normal. Judgment and thought content normal.  Vitals reviewed.  BP (!) 153/88 (BP Location: Right Arm)   Pulse 73   Resp 16   Ht 6\' 4"  (1.93 m)   Wt 199 lb (90.3 kg)   BMI 24.22 kg/m   Past Medical History:  Diagnosis Date  . Chicken pox   . GERD (gastroesophageal reflux disease)   . Hyperlipidemia   . Seizures ([place])    None since age 66   Social History   Social History  . Marital status: Married    Spouse name: N/A  . Number of children: N/A  . Years of education: N/A   Occupational History  . Not on file.   Social History Main Topics  . Smoking status: Current Every Day Smoker    Packs/day: 1.00    Years: 50.00    Types: Cigarettes  . Smokeless tobacco: Never Used  . Alcohol use No  . Drug use: No  . Sexual activity: Not on file   Other Topics Concern  . Not on file   Social History Narrative   Retired.   Stays busy with household work, Surveyor, mining.   Married for 3 years.  Grown children.   Enjoys reading.    Past Surgical History:  Procedure Laterality Date  . LAMINECTOMY  1987  . TONSILLECTOMY     Family History  Problem Relation Age of Onset  . Heart disease Mother        Arrthymia   . Stroke Father        Deceased  . Dementia Father   . Heart disease Brother        Myocardial infarction  . Colon cancer Neg Hx    Allergies  Allergen Reactions  . Prozac [Fluoxetine Hcl] Other (See Comments)    Extreme depression      Assessment & Plan:  Patient presents for a 3 month follow-up to review vascular studies. The patient presents with a chief complaint of worsening left lower extremity claudication. Patient had to cancel a vacation due to his inability to  walk long distances. He reports a interval shortening in his claudication distance. Patient denies any rest pain or ulceration to the lower extremity. Patient underwent an ABI which was notable for no evidence of right lower extremity arterial disease. Left ankle brachial index suggests moderate arterial occlusive disease. Patient states his symptoms have progressed to the point he is unable to function on a daily basis. Patient denies any fever, nausea or vomiting.  1. Atherosclerosis of native artery of left lower extremity with intermittent claudication (HCC) - worsening Patient with progressive worsening of his left lower extremity claudication. Patient is symptomatic to the point he is unable to function on a daily basis Decreased pedal pulses to the left lower extremity on exam Moderate arterial occlusive disease on ABI Recommend a left lower extremity angiogram with possible intervention to assess anatomy and possibly revascularize the leg if appropriate Procedure, risks and benefits explained to the patient All questions answered Patient wishes to proceed  2. Hyperlipidemia, unspecified hyperlipidemia type - stable Encouraged good control as its slows the progression of atherosclerotic disease  3. Tobacco abuse - stable We had a discussion for approximately 10 minutes regarding the absolute need for smoking cessation due to the deleterious nature of tobacco on the vascular system. We discussed the tobacco use would diminish patency of any intervention, and likely significantly worsen progressio of disease. We discussed multiple agents for quitting including replacement therapy or medications to reduce cravings such as Chantix. The patient voices their understanding of the importance of smoking cessation.  Current Outpatient Prescriptions on File Prior to Visit  Medication Sig Dispense Refill  . clonazePAM (KLONOPIN) 0.5 MG tablet Take 1/2 tablet by mouth daily for three days as needed for  tremors. 15 tablet 0  . clopidogrel (PLAVIX) 75 MG tablet Take 75 mg by mouth daily.     Marland Kitchen gemfibrozil (LOPID) 600 MG tablet Take 600 mg by mouth daily.     Marland Kitchen ibuprofen (ADVIL,MOTRIN) 200 MG tablet Take 200 mg by mouth as needed.    Marland Kitchen aspirin 81 MG tablet Take 81 mg by mouth daily.    . budesonide-formoterol (SYMBICORT) 160-4.5 MCG/ACT inhaler Inhale 2 puffs into the lungs 2 (two) times daily. (Patient not taking: Reported on 04/26/2017) 1 Inhaler 11  . esomeprazole (NEXIUM) 20 MG capsule Take 20 mg by mouth daily at 12 noon.    Marland Kitchen tiZANidine (ZANAFLEX) 2 MG tablet Take 1 tablet (2 mg total) by mouth 2 (two) times daily as needed for muscle spasms. (Patient not taking: Reported on 04/26/2017) 30 tablet 2   No current facility-administered medications on file prior to  visit.    There are no Patient Instructions on file for this visit. Return if symptoms worsen or fail to improve.  Verneice Caspers A Rachna Schonberger, PA-C

## 2017-04-29 ENCOUNTER — Other Ambulatory Visit (INDEPENDENT_AMBULATORY_CARE_PROVIDER_SITE_OTHER): Payer: Self-pay | Admitting: Vascular Surgery

## 2017-04-30 ENCOUNTER — Encounter
Admission: RE | Admit: 2017-04-30 | Discharge: 2017-04-30 | Disposition: A | Discharge status: Home / Self Care | Payer: Medicare Other | Source: Ambulatory Visit | Attending: Vascular Surgery | Admitting: Vascular Surgery

## 2017-04-30 DIAGNOSIS — Z7982 Long term (current) use of aspirin: Secondary | ICD-10-CM | POA: Diagnosis not present

## 2017-04-30 DIAGNOSIS — I739 Peripheral vascular disease, unspecified: Secondary | ICD-10-CM | POA: Diagnosis present

## 2017-04-30 DIAGNOSIS — M5136 Other intervertebral disc degeneration, lumbar region: Secondary | ICD-10-CM | POA: Diagnosis not present

## 2017-04-30 DIAGNOSIS — M9903 Segmental and somatic dysfunction of lumbar region: Secondary | ICD-10-CM | POA: Diagnosis not present

## 2017-04-30 DIAGNOSIS — M9905 Segmental and somatic dysfunction of pelvic region: Secondary | ICD-10-CM | POA: Diagnosis not present

## 2017-04-30 DIAGNOSIS — Z7902 Long term (current) use of antithrombotics/antiplatelets: Secondary | ICD-10-CM | POA: Diagnosis not present

## 2017-04-30 DIAGNOSIS — F17210 Nicotine dependence, cigarettes, uncomplicated: Secondary | ICD-10-CM | POA: Diagnosis not present

## 2017-04-30 DIAGNOSIS — E785 Hyperlipidemia, unspecified: Secondary | ICD-10-CM | POA: Diagnosis not present

## 2017-04-30 DIAGNOSIS — Z79899 Other long term (current) drug therapy: Secondary | ICD-10-CM | POA: Diagnosis not present

## 2017-04-30 DIAGNOSIS — M5416 Radiculopathy, lumbar region: Secondary | ICD-10-CM | POA: Diagnosis not present

## 2017-04-30 DIAGNOSIS — I70212 Atherosclerosis of native arteries of extremities with intermittent claudication, left leg: Secondary | ICD-10-CM | POA: Diagnosis not present

## 2017-04-30 HISTORY — DX: Prediabetes: R73.03

## 2017-04-30 LAB — CREATININE, SERUM
Creatinine, Ser: 1.19 mg/dL (ref 0.61–1.24)
GFR calc Af Amer: >60 mL/min (ref >60)

## 2017-04-30 LAB — BUN: BUN: 12 mg/dL (ref 6–20)

## 2017-05-01 MED ORDER — CEFAZOLIN SODIUM-DEXTROSE 1-4 GM/50ML-% IV SOLN: 1.0000 g | Freq: Once | INTRAVENOUS | Status: DC

## 2017-05-02 ENCOUNTER — Ambulatory Visit
Admission: RE | Admit: 2017-05-02 | Discharge: 2017-05-02 | Disposition: A | Discharge status: Home / Self Care | Payer: Medicare Other | Source: Ambulatory Visit | Attending: Vascular Surgery | Admitting: Vascular Surgery

## 2017-05-02 ENCOUNTER — Encounter
Admission: RE | Disposition: A | Discharge status: Home / Self Care | Payer: Self-pay | Source: Ambulatory Visit | Attending: Vascular Surgery

## 2017-05-02 ENCOUNTER — Encounter: Payer: Self-pay | Admitting: *Deleted

## 2017-05-02 DIAGNOSIS — Z7902 Long term (current) use of antithrombotics/antiplatelets: Secondary | ICD-10-CM | POA: Insufficient documentation

## 2017-05-02 DIAGNOSIS — I70212 Atherosclerosis of native arteries of extremities with intermittent claudication, left leg: Secondary | ICD-10-CM | POA: Diagnosis not present

## 2017-05-02 DIAGNOSIS — Z7982 Long term (current) use of aspirin: Secondary | ICD-10-CM | POA: Diagnosis not present

## 2017-05-02 DIAGNOSIS — F17210 Nicotine dependence, cigarettes, uncomplicated: Secondary | ICD-10-CM | POA: Diagnosis not present

## 2017-05-02 DIAGNOSIS — E785 Hyperlipidemia, unspecified: Secondary | ICD-10-CM | POA: Diagnosis not present

## 2017-05-02 DIAGNOSIS — Z79899 Other long term (current) drug therapy: Secondary | ICD-10-CM | POA: Diagnosis not present

## 2017-05-02 HISTORY — PX: LOWER EXTREMITY ANGIOGRAPHY: CATH118251

## 2017-05-02 SURGERY — LOWER EXTREMITY ANGIOGRAPHY
Anesthesia: Moderate Sedation | Site: Leg Lower | Laterality: Left

## 2017-05-02 MED ORDER — LIDOCAINE-EPINEPHRINE (PF) 1 %-1:200000 IJ SOLN
INTRAMUSCULAR | Status: AC
  Filled 2017-05-02: qty 30

## 2017-05-02 MED ORDER — SODIUM CHLORIDE 0.9 % IV SOLN
INTRAVENOUS | Status: DC
  Administered 2017-05-02: 09:00:00 via INTRAVENOUS

## 2017-05-02 MED ORDER — FAMOTIDINE 20 MG PO TABS: 40.0000 mg | ORAL_TABLET | ORAL | Status: DC | PRN

## 2017-05-02 MED ORDER — HEPARIN SODIUM (PORCINE) 1000 UNIT/ML IJ SOLN
INTRAMUSCULAR | Status: DC | PRN
  Administered 2017-05-02: 5000 [IU] via INTRAVENOUS

## 2017-05-02 MED ORDER — METHYLPREDNISOLONE SODIUM SUCC 125 MG IJ SOLR
125.0000 mg | INTRAMUSCULAR | Status: DC | PRN
Start: 2017-05-02 — End: 2017-05-02

## 2017-05-02 MED ORDER — FENTANYL CITRATE (PF) 100 MCG/2ML IJ SOLN
INTRAMUSCULAR | Status: AC
  Filled 2017-05-02: qty 2

## 2017-05-02 MED ORDER — MIDAZOLAM HCL 2 MG/2ML IJ SOLN
INTRAMUSCULAR | Status: DC | PRN
Start: 2017-05-02 — End: 2017-05-02
  Administered 2017-05-02 (×2): 1 mg via INTRAVENOUS
  Administered 2017-05-02: 2 mg via INTRAVENOUS
  Administered 2017-05-02: 1 mg via INTRAVENOUS

## 2017-05-02 MED ORDER — ATORVASTATIN CALCIUM 10 MG PO TABS: 10.0000 mg | ORAL_TABLET | Freq: Every day | ORAL | 11 refills | Status: DC

## 2017-05-02 MED ORDER — HYDRALAZINE HCL 20 MG/ML IJ SOLN: 5.0000 mg | INTRAMUSCULAR | Status: DC | PRN

## 2017-05-02 MED ORDER — FENTANYL CITRATE (PF) 100 MCG/2ML IJ SOLN
INTRAMUSCULAR | Status: DC | PRN
  Administered 2017-05-02: 50 ug via INTRAVENOUS
  Administered 2017-05-02 (×2): 25 ug via INTRAVENOUS
  Administered 2017-05-02: 50 ug via INTRAVENOUS

## 2017-05-02 MED ORDER — IOPAMIDOL (ISOVUE-300) INJECTION 61%
INTRAVENOUS | Status: DC | PRN
  Administered 2017-05-02: 60 mL via INTRAVENOUS

## 2017-05-02 MED ORDER — SODIUM CHLORIDE 0.9 % IV SOLN
250.0000 mL | INTRAVENOUS | Status: DC | PRN
Start: 2017-05-02 — End: 2017-05-02

## 2017-05-02 MED ORDER — SODIUM CHLORIDE 0.9% FLUSH: 3.0000 mL | Freq: Two times a day (BID) | INTRAVENOUS | Status: DC

## 2017-05-02 MED ORDER — HEPARIN (PORCINE) IN NACL 2-0.9 UNIT/ML-% IJ SOLN
INTRAMUSCULAR | Status: AC
  Filled 2017-05-02: qty 1000

## 2017-05-02 MED ORDER — ATORVASTATIN CALCIUM 10 MG PO TABS: 10.0000 mg | ORAL_TABLET | Freq: Every day | ORAL | Status: DC

## 2017-05-02 MED ORDER — LABETALOL HCL 5 MG/ML IV SOLN: 10.0000 mg | INTRAVENOUS | Status: DC | PRN

## 2017-05-02 MED ORDER — SODIUM CHLORIDE 0.9% FLUSH: 3.0000 mL | INTRAVENOUS | Status: DC | PRN

## 2017-05-02 MED ORDER — MIDAZOLAM HCL 5 MG/5ML IJ SOLN
INTRAMUSCULAR | Status: AC
  Filled 2017-05-02: qty 5

## 2017-05-02 MED ORDER — SODIUM CHLORIDE 0.9 % IV SOLN: INTRAVENOUS | Status: DC

## 2017-05-02 MED ORDER — CEFAZOLIN SODIUM-DEXTROSE 1-4 GM/50ML-% IV SOLN
INTRAVENOUS | Status: AC
  Filled 2017-05-02: qty 50

## 2017-05-02 MED ORDER — HEPARIN SODIUM (PORCINE) 1000 UNIT/ML IJ SOLN
INTRAMUSCULAR | Status: AC
  Filled 2017-05-02: qty 1

## 2017-05-02 SURGICAL SUPPLY — 21 items
BALLN DORADO 6X40X80 (BALLOONS) ×3
BALLN LUTONIX 5X220X130 (BALLOONS) ×3
BALLN LUTONIX DCB 7X60X130 (BALLOONS) ×3
BALLN ULTRVRSE  5X150X130C (BALLOONS) ×2
BALLN ULTRVRSE 5X150X130C (BALLOONS) ×1
BALLOON DORADO 6X40X80 (BALLOONS) ×1 IMPLANT
BALLOON LUTONIX 5X220X130 (BALLOONS) ×1 IMPLANT
BALLOON LUTONIX DCB 7X60X130 (BALLOONS) ×1 IMPLANT
BALLOON ULTRVRSE 5X150X130C (BALLOONS) ×1 IMPLANT
CATH BEACON 5 .038 100 VERT TP (CATHETERS) ×3 IMPLANT
CATH PIG 70CM (CATHETERS) ×3 IMPLANT
COVER PROBE U/S 5X48 (MISCELLANEOUS) ×3 IMPLANT
DEVICE PRESTO INFLATION (MISCELLANEOUS) ×3 IMPLANT
DEVICE STARCLOSE SE CLOSURE (Vascular Products) ×3 IMPLANT
GLIDEWIRE ADV .035X260CM (WIRE) ×3 IMPLANT
PACK ANGIOGRAPHY (CUSTOM PROCEDURE TRAY) ×3 IMPLANT
SHEATH ANL2 6FRX45 HC (SHEATH) ×3 IMPLANT
SHEATH BRITE TIP 5FRX11 (SHEATH) ×3 IMPLANT
STENT LIFESTENT 5F 6X150X135 (Permanent Stent) ×3 IMPLANT
TOWEL OR 17X26 4PK STRL BLUE (TOWEL DISPOSABLE) ×3 IMPLANT
WIRE J 3MM .035X145CM (WIRE) ×3 IMPLANT

## 2017-05-02 NOTE — Op Note (Signed)
[facility] Percutaneous Study/Intervention Procedural Note   Date of Surgery: 05/02/2017  Surgeon(s):Nehemiah Montee   Assistants:none  Pre-operative Diagnosis: PAD with claudication left lower extremity  Post-operative diagnosis: Same  Procedure(s) Performed: 1. Ultrasound guidance for vascular access right femoral artery 2. Catheter placement into left popliteal artery from right femoral approach 3. Aortogram and selective left lower extremity angiogram 4. Percutaneous transluminal angioplasty of right external iliac artery with 7 mm diameter by 6 cm length with a Lutonix drug-coated angioplasty balloon 5. Percutaneous transluminal angioplasty of the left SFA and above-knee popliteal artery with 5 mm diameter by 22 cm length Lutonix drug-coated angioplasty balloon  6.  Self-expanding stent placement to the left distal SFA and above-knee popliteal artery with 6 mm diameter by 15 cm length stent 7. StarClose closure device right femoral artery  EBL: 5 cc  Contrast: 60 cc  Fluoro Time: 5 minutes  Moderate Conscious Sedation Time: approximately 50 minutes using 5 mg of Versed and 150 Mcg of Fentanyl  Indications: Patient is a 66 y.o.male with lifestyle limiting claudication of the left lower extremity. The patient has noninvasive study showing reduced perfusion in the left leg. The patient is brought in for angiography for further evaluation and potential treatment. Risks and benefits are discussed and informed consent is obtained  Procedure: The patient was identified and appropriate procedural time out was performed. The patient was then placed supine on the table and prepped and draped in the usual sterile fashion.Moderate conscious sedation was administered during a face to face encounter with the patient throughout the procedure with my supervision of the RN  administering medicines and monitoring the patient's vital signs, pulse oximetry, telemetry and mental status throughout from the start of the procedure until the patient was taken to the recovery room. Ultrasound was used to evaluate the right common femoral artery. It was patent . A digital ultrasound image was acquired. A Seldinger needle was used to access the right common femoral artery under direct ultrasound guidance and a permanent image was performed. A 0.035 J wire was advanced without resistance and a 5Fr sheath was placed. Pigtail catheter was placed into the aorta and an AP aortogram was performed. This demonstrated normal renal arteries with normal aorta the left iliac system is patent without significant stenosis.  The right common iliac artery did not have stenosis but just beyond the iliac bifurcation there was about an 80% stenosis in the proximal right external iliac artery. I then crossed the aortic bifurcation and advanced to the left femoral head. Selective left lower extremity angiogram was then performed. This demonstrated normal common femoral artery and profunda femoris artery.  Superficial femoral artery was occluded in the midsegment and reconstituted above-knee popliteal artery which was somewhat diseased until a few centimeters above the knee joint.  There was then two-vessel runoff to the foot with the posterior tibial artery being dominant and the peroneal artery being a second vessel. The patient was systemically heparinized and I began by treating the right external iliac artery lesion prior to putting the up and over sheath in.  A 7 mm diameter by 6 cm length Lutonix drug-coated angioplasty balloon was then inflated to 12 atm for 1 minute.  Completion angiogram showed only about a 5-10% residual stenosis after angioplasty.  I then proceeded with evaluation and intervention on the left lower extremity and a 6 French Ansell sheath was then placed over the [employer] wire.  I then used a Kumpe catheter and the advantage  wire to navigate through the SFA occlusion and confirmed flow in the popliteal artery at the level of the knee.  A wire was then placed down into the tibial vessels and the catheter was removed and I proceeded with treatment.  A 5 mm diameter by 22 cm length angioplasty balloon was used to treat the mid and distal SFA and the above-knee popliteal artery.  This was a Lutonix drug-coated angioplasty balloon.  This was inflated to 10 atm for 1 minute.  Completion angiogram showed in the distal SFA and proximal popliteal artery there remained high-grade was sent.  The proximal portion of treatment was markedly improved.  For the distal SFA and above-knee popliteal artery a 6 mm diameter by 18 cm length self-expanding stent was selected and deployed and postdilated with a 5 mm balloon with excellent angiographic completion result in 10% or less residual stenosis. I elected to terminate the procedure. The sheath was removed and StarClose closure device was deployed in the right femoral artery with excellent hemostatic result. The patient was taken to the recovery room in stable condition having tolerated the procedure well.  Findings:  Aortogram:Normal renal arteries with normal aorta the left iliac system is patent without significant stenosis.  The right common iliac artery did not have stenosis but just beyond the iliac bifurcation there was about an 80% stenosis in the proximal right external iliac artery. Left lower Extremity: This demonstrated normal common femoral artery and profunda femoris artery.  Superficial femoral artery was occluded in the midsegment and reconstituted above-knee popliteal artery which was somewhat diseased until a few centimeters above the knee joint.  There was then two-vessel runoff to the foot with the posterior tibial artery being dominant and the peroneal artery being a second vessel.   Disposition: Patient  was taken to the recovery room in stable condition having tolerated the procedure well.  Complications: None  Leotis Pain 05/02/2017 10:49 AM   This note was created with Dragon Medical transcription system. Any errors in dictation are purely unintentional.

## 2017-05-02 NOTE — H&P (Signed)
[facility] History & Physical Update  The patient was interviewed and re-examined.  The patient's previous History and Physical has been reviewed and is unchanged.  There is no change in the plan of care. We plan to proceed with the scheduled procedure.  Leotis Pain, MD  05/02/2017, 9:28 AM

## 2017-05-06 ENCOUNTER — Telehealth (INDEPENDENT_AMBULATORY_CARE_PROVIDER_SITE_OTHER): Payer: Self-pay

## 2017-05-06 NOTE — Telephone Encounter (Signed)
Called the patient back to give him the advise that Dr. Lucky Cowboy suggested. The patient was pleased with getting feed back so soon and he says that elevating his leg seems to help a great bit.

## 2017-05-06 NOTE — Telephone Encounter (Signed)
The pain is not uncommon.  That should get better over the next couple of weeks. Not sure about the chills and sweats or lightheadedness.  Could be a medication reaction.  Not really likely due to the increased blood flow in his leg.  Would try to do the Plavix for at least a month, and then we can stop it and see if it helps

## 2017-05-06 NOTE — Telephone Encounter (Signed)
Patient called to report that he is having pain/stabbing pain above his knee into the thigh area. He wants to know if this in normal?  He also reported that he had chills and sweats the next day after the surgery and that lasted for 48 hours, and now he is having lightheadedness and he wants to know is this normal due to the new medication that he is on, and because of the new blood flow through his leg and foot?

## 2017-05-28 DIAGNOSIS — M9905 Segmental and somatic dysfunction of pelvic region: Secondary | ICD-10-CM | POA: Diagnosis not present

## 2017-05-28 DIAGNOSIS — M5136 Other intervertebral disc degeneration, lumbar region: Secondary | ICD-10-CM | POA: Diagnosis not present

## 2017-05-28 DIAGNOSIS — M5416 Radiculopathy, lumbar region: Secondary | ICD-10-CM | POA: Diagnosis not present

## 2017-05-28 DIAGNOSIS — M9903 Segmental and somatic dysfunction of lumbar region: Secondary | ICD-10-CM | POA: Diagnosis not present

## 2017-06-03 ENCOUNTER — Other Ambulatory Visit (INDEPENDENT_AMBULATORY_CARE_PROVIDER_SITE_OTHER): Payer: Self-pay | Admitting: Vascular Surgery

## 2017-06-03 DIAGNOSIS — I739 Peripheral vascular disease, unspecified: Secondary | ICD-10-CM

## 2017-06-05 ENCOUNTER — Ambulatory Visit (INDEPENDENT_AMBULATORY_CARE_PROVIDER_SITE_OTHER): Payer: Medicare Other | Admitting: Vascular Surgery

## 2017-06-05 ENCOUNTER — Ambulatory Visit (INDEPENDENT_AMBULATORY_CARE_PROVIDER_SITE_OTHER): Payer: Medicare Other

## 2017-06-05 DIAGNOSIS — I739 Peripheral vascular disease, unspecified: Secondary | ICD-10-CM | POA: Diagnosis not present

## 2017-06-12 ENCOUNTER — Encounter (INDEPENDENT_AMBULATORY_CARE_PROVIDER_SITE_OTHER): Payer: Self-pay

## 2017-06-25 DIAGNOSIS — M9903 Segmental and somatic dysfunction of lumbar region: Secondary | ICD-10-CM | POA: Diagnosis not present

## 2017-06-25 DIAGNOSIS — M5136 Other intervertebral disc degeneration, lumbar region: Secondary | ICD-10-CM | POA: Diagnosis not present

## 2017-06-25 DIAGNOSIS — M9905 Segmental and somatic dysfunction of pelvic region: Secondary | ICD-10-CM | POA: Diagnosis not present

## 2017-06-25 DIAGNOSIS — M5416 Radiculopathy, lumbar region: Secondary | ICD-10-CM | POA: Diagnosis not present

## 2017-06-27 ENCOUNTER — Other Ambulatory Visit: Payer: Self-pay

## 2017-06-28 ENCOUNTER — Other Ambulatory Visit (INDEPENDENT_AMBULATORY_CARE_PROVIDER_SITE_OTHER): Payer: Self-pay | Admitting: Vascular Surgery

## 2017-07-02 ENCOUNTER — Other Ambulatory Visit (INDEPENDENT_AMBULATORY_CARE_PROVIDER_SITE_OTHER): Payer: Self-pay | Admitting: Vascular Surgery

## 2017-07-24 DIAGNOSIS — M9903 Segmental and somatic dysfunction of lumbar region: Secondary | ICD-10-CM | POA: Diagnosis not present

## 2017-07-24 DIAGNOSIS — M9905 Segmental and somatic dysfunction of pelvic region: Secondary | ICD-10-CM | POA: Diagnosis not present

## 2017-07-24 DIAGNOSIS — M5136 Other intervertebral disc degeneration, lumbar region: Secondary | ICD-10-CM | POA: Diagnosis not present

## 2017-07-24 DIAGNOSIS — M5416 Radiculopathy, lumbar region: Secondary | ICD-10-CM | POA: Diagnosis not present

## 2017-07-30 ENCOUNTER — Telehealth: Payer: Self-pay | Admitting: Primary Care

## 2017-07-30 DIAGNOSIS — D485 Neoplasm of uncertain behavior of skin: Secondary | ICD-10-CM | POA: Diagnosis not present

## 2017-07-30 DIAGNOSIS — L814 Other melanin hyperpigmentation: Secondary | ICD-10-CM | POA: Diagnosis not present

## 2017-07-30 DIAGNOSIS — D2261 Melanocytic nevi of right upper limb, including shoulder: Secondary | ICD-10-CM | POA: Diagnosis not present

## 2017-07-30 DIAGNOSIS — L570 Actinic keratosis: Secondary | ICD-10-CM | POA: Diagnosis not present

## 2017-07-30 DIAGNOSIS — D225 Melanocytic nevi of trunk: Secondary | ICD-10-CM | POA: Diagnosis not present

## 2017-07-30 DIAGNOSIS — X32XXXA Exposure to sunlight, initial encounter: Secondary | ICD-10-CM | POA: Diagnosis not present

## 2017-07-30 DIAGNOSIS — Z85820 Personal history of malignant melanoma of skin: Secondary | ICD-10-CM | POA: Diagnosis not present

## 2017-07-30 DIAGNOSIS — Z85828 Personal history of other malignant neoplasm of skin: Secondary | ICD-10-CM | POA: Diagnosis not present

## 2017-07-30 DIAGNOSIS — D0439 Carcinoma in situ of skin of other parts of face: Secondary | ICD-10-CM | POA: Diagnosis not present

## 2017-07-30 NOTE — Telephone Encounter (Signed)
Please schedule patient for 30 minute follow-up visit so we can discuss symptoms and new medications.

## 2017-07-30 NOTE — Telephone Encounter (Signed)
Copied from [place] [id number]. Topic: Quick Communication - See Telephone Encounter >> Jul 30, 2017 11:31 AM Antonieta Iba C wrote: CRM for notification. See Telephone encounter for: pt says that he was seen by a vascular Dr. And had a stint and balloons placed. Pt was says that he was put on 3 medications 1. Atorvastatin 2. Clopidogrel and 3. Gemfibrozil. Pt says that he was advised by Vascular to contact PCP to see if she want pt to continue medications. Pt says that he has had some side effects to medication such as flu like symptoms; congestion so he stopped taking medication.   Please advise     07/30/17.

## 2017-07-31 NOTE — Telephone Encounter (Signed)
Patient notified as instructed by telephone and verbalized understanding. Appointment scheduled for 08/05/17 with Anda Kraft as instructed.

## 2017-08-05 ENCOUNTER — Ambulatory Visit (INDEPENDENT_AMBULATORY_CARE_PROVIDER_SITE_OTHER): Payer: Medicare Other | Admitting: Primary Care

## 2017-08-05 ENCOUNTER — Encounter: Payer: Self-pay | Admitting: Primary Care

## 2017-08-05 ENCOUNTER — Other Ambulatory Visit: Payer: Self-pay | Admitting: Primary Care

## 2017-08-05 VITALS — BP 128/80 | HR 92 | Temp 98.2°F | Wt 200.8 lb

## 2017-08-05 DIAGNOSIS — I70212 Atherosclerosis of native arteries of extremities with intermittent claudication, left leg: Secondary | ICD-10-CM

## 2017-08-05 DIAGNOSIS — E785 Hyperlipidemia, unspecified: Secondary | ICD-10-CM | POA: Diagnosis not present

## 2017-08-05 LAB — LIPID PANEL
CHOL/HDL RATIO: 6
CHOLESTEROL: 249 mg/dL — AB (ref 0–200)
HDL: 43.2 mg/dL (ref >39.00)
LDL CALC: 178 mg/dL — AB (ref 0–99)
NonHDL: 205.47
TRIGLYCERIDES: 136 mg/dL (ref 0.0–149.0)
VLDL: 27.2 mg/dL (ref 0.0–40.0)

## 2017-08-05 MED ORDER — ROSUVASTATIN CALCIUM 5 MG PO TABS: 5.0000 mg | ORAL_TABLET | Freq: Every day | ORAL | 0 refills | Status: DC

## 2017-08-05 NOTE — Assessment & Plan Note (Signed)
Suspect the atorvastatin to be contributing to the majority of his symptoms, could also be gemfibrozil.   Will have him resume clopidogrel given lack of symptoms and presence of stents to lower extremity. Will check lipid panel today. Will also switch from atorvastatin to rosuvastatin 2 weeks after resuming clopidogrel. Hold gemfibrozil for now. Strongly advised him to quit smoking.

## 2017-08-05 NOTE — Patient Instructions (Signed)
Restart clopidogrel 75 mg once daily.  Stop by the lab prior to leaving today. I will notify you of your results once received.   We will start a new medication called rosuvastatin (Crestor) once I receive your lab results. Hold off on starting this until you've resumed the clopidogrel for two weeks.  It was a pleasure to see you today!

## 2017-08-05 NOTE — Progress Notes (Signed)
Subjective:    Patient ID: Nathaniel [name], male    DOB: 1950-09-02, 67 y.o.   MRN: [id number]  HPI  Mr. Blando is a 67 year old male with a history of hyperlipidemia, atherosclerosis with intermittent claudication, tobacco abuse who presents today for follow up.  He is currently following with vascular surgery who recently prescribed atorvastatin and gemfibrozil. His last lipid panel was in July 2018 with TC of 199 and LDL of 139, he was managed on clopidogrel only at that point.   He phoned into our office last week with reports of "flu-like" symptoms on these medications so he decided to stop these medications. Symptoms included "disorientation, low energy, muscle aches, fatigue" with the combination of these medications.   He has noticed his hands begin to tingle like the sensation in his legs. He continues to smoke. He continues to work on his diet but cutting back on fried/greasy foods. He's not taken his atorvastatin and gemfibrozil in 1 month, he stopped taking clopidogrel 2 weeks ago. He didn't notice his symptoms when on clopidogrel alone. All symptoms have since resolved.   Review of Systems  Respiratory: Negative for shortness of breath.   Cardiovascular: Negative for chest pain and leg swelling.  Musculoskeletal: Negative for myalgias.  Skin: Negative for color change.       Past Medical History:  Diagnosis Date  . Chicken pox   . GERD (gastroesophageal reflux disease)   . Hyperlipidemia   . Pre-diabetes   . Seizures ([place])    None since age 48     Social History   Socioeconomic History  . Marital status: Married    Spouse name: Not on file  . Number of children: Not on file  . Years of education: Not on file  . Highest education level: Not on file  Social Needs  . Financial resource strain: Not on file  . Food insecurity - worry: Not on file  . Food insecurity - inability: Not on file  . Transportation needs - medical: Not on file  . Transportation  needs - non-medical: Not on file  Occupational History  . Not on file  Tobacco Use  . Smoking status: Current Every Day Smoker    Packs/day: 1.00    Years: 50.00    Pack years: 50.00    Types: Cigarettes  . Smokeless tobacco: Never Used  Substance and Sexual Activity  . Alcohol use: Yes    Alcohol/week: 0.0 oz    Comment: scotch twice a year.  . Drug use: No  . Sexual activity: Not on file  Other Topics Concern  . Not on file  Social History Narrative   Retired.   Stays busy with household work, Surveyor, mining.   Married for 3 years.   Grown children.   Enjoys reading.     Past Surgical History:  Procedure Laterality Date  . BACK SURGERY    . LAMINECTOMY  1987  . LOWER EXTREMITY ANGIOGRAPHY Left 05/02/2017   Procedure: Lower Extremity Angiography;  Surgeon: Algernon Huxley, MD;  Location: [facility];  Service: Cardiovascular;  Laterality: Left;  . TONSILLECTOMY      Family History  Problem Relation Age of Onset  . Heart disease Mother        Arrthymia   . Stroke Father        Deceased  . Dementia Father   . Heart disease Brother        Myocardial infarction  . Colon  cancer Neg Hx     Allergies  Allergen Reactions  . Prozac [Fluoxetine Hcl] Other (See Comments)    Extreme depression    Current Outpatient Medications on File Prior to Visit  Medication Sig Dispense Refill  . Naproxen Sod-Diphenhydramine (ALEVE PM PO) Take by mouth at bedtime.    . Aspirin-Calcium Carbonate 81-777 MG TABS Take 81 mg by mouth daily.    Marland Kitchen atorvastatin (LIPITOR) 10 MG tablet Take 1 tablet (10 mg total) by mouth daily. (Patient not taking: Reported on 08/05/2017) 30 tablet 11  . budesonide-formoterol (SYMBICORT) 160-4.5 MCG/ACT inhaler Inhale 2 puffs into the lungs 2 (two) times daily. (Patient not taking: Reported on 04/26/2017) 1 Inhaler 11  . clonazePAM (KLONOPIN) 0.5 MG tablet Take 1/2 tablet by mouth daily for three days as needed for tremors. (Patient not taking: Reported  on 04/30/2017) 15 tablet 0  . clopidogrel (PLAVIX) 75 MG tablet TAKE 1 TABLET BY MOUTH EVERY DAY (Patient not taking: Reported on 08/05/2017) 30 tablet 6  . diphenhydrAMINE (BENADRYL) 25 MG tablet Take 25 mg by mouth every 6 (six) hours as needed for itching.    . esomeprazole (NEXIUM) 20 MG capsule Take 20 mg by mouth daily as needed (heartburn).     Marland Kitchen gemfibrozil (LOPID) 600 MG tablet Take 600 mg by mouth daily.     Marland Kitchen ibuprofen (ADVIL,MOTRIN) 200 MG tablet Take 200 mg by mouth every 6 (six) hours as needed for mild pain (anti inflammatory).     Marland Kitchen tiZANidine (ZANAFLEX) 2 MG tablet Take 1 tablet (2 mg total) by mouth 2 (two) times daily as needed for muscle spasms. (Patient not taking: Reported on 04/26/2017) 30 tablet 2   No current facility-administered medications on file prior to visit.     BP 128/80   Pulse 92   Temp 98.2 F (36.8 C) (Oral)   Wt 200 lb 12 oz (91.1 kg)   BMI 24.44 kg/m    Objective:   Physical Exam  Constitutional: He is oriented to person, place, and time. He appears well-nourished.  Neck: Neck supple. Carotid bruit is present.  Cardiovascular: Normal rate and regular rhythm.  Pulmonary/Chest: Effort normal and breath sounds normal. He has no wheezes. He has no rales.  Neurological: He is alert and oriented to person, place, and time.  Skin: Skin is warm and dry.          Assessment & Plan:

## 2017-08-05 NOTE — Assessment & Plan Note (Signed)
Will have him resume clopidogrel given lack of symptoms and presence of stents to lower extremity. Will check lipid panel today. Will also switch from atorvastatin to rosuvastatin 2 weeks after resuming clopidogrel. Hold gemfibrozil for now. Strongly advised him to quit smoking.

## 2017-08-20 DIAGNOSIS — M9903 Segmental and somatic dysfunction of lumbar region: Secondary | ICD-10-CM | POA: Diagnosis not present

## 2017-08-20 DIAGNOSIS — M9905 Segmental and somatic dysfunction of pelvic region: Secondary | ICD-10-CM | POA: Diagnosis not present

## 2017-08-20 DIAGNOSIS — M5136 Other intervertebral disc degeneration, lumbar region: Secondary | ICD-10-CM | POA: Diagnosis not present

## 2017-08-20 DIAGNOSIS — M5416 Radiculopathy, lumbar region: Secondary | ICD-10-CM | POA: Diagnosis not present

## 2017-09-12 DIAGNOSIS — Z85828 Personal history of other malignant neoplasm of skin: Secondary | ICD-10-CM | POA: Diagnosis not present

## 2017-09-12 DIAGNOSIS — L988 Other specified disorders of the skin and subcutaneous tissue: Secondary | ICD-10-CM | POA: Diagnosis not present

## 2017-09-12 DIAGNOSIS — D0339 Melanoma in situ of other parts of face: Secondary | ICD-10-CM | POA: Diagnosis not present

## 2017-09-12 DIAGNOSIS — L814 Other melanin hyperpigmentation: Secondary | ICD-10-CM | POA: Diagnosis not present

## 2017-09-12 DIAGNOSIS — L578 Other skin changes due to chronic exposure to nonionizing radiation: Secondary | ICD-10-CM | POA: Diagnosis not present

## 2017-09-17 DIAGNOSIS — M5416 Radiculopathy, lumbar region: Secondary | ICD-10-CM | POA: Diagnosis not present

## 2017-09-17 DIAGNOSIS — M5136 Other intervertebral disc degeneration, lumbar region: Secondary | ICD-10-CM | POA: Diagnosis not present

## 2017-09-17 DIAGNOSIS — M9905 Segmental and somatic dysfunction of pelvic region: Secondary | ICD-10-CM | POA: Diagnosis not present

## 2017-09-17 DIAGNOSIS — M9903 Segmental and somatic dysfunction of lumbar region: Secondary | ICD-10-CM | POA: Diagnosis not present

## 2017-09-24 ENCOUNTER — Ambulatory Visit: Payer: Self-pay | Admitting: *Deleted

## 2017-09-24 NOTE — Telephone Encounter (Signed)
Noted  

## 2017-09-24 NOTE — Telephone Encounter (Signed)
Patient is calling to report that he is having side effects from the Rosuvastatin. He has been taking it since 08/25/17. He is reporting worsening fatigue, nausea, and trouble with concentration. He states he is calling to let his provider know he is going to discontinue. ( Patient has a history with SE and statin use) He plans to continue the generic Plavix that he had started 30 days prior to starting the generic Crestor. He was not having symptoms before he started and so he is going to continue that. His next office follow up is 12/06/17.  Answer Assessment - Initial Assessment Questions 1. SYMPTOMS: "Do you have any symptoms?"     Lethargic, nausea- patient started 08/25/17 2. SEVERITY: If symptoms are present, ask "Are they mild, moderate or severe?"     Patient can't concentrate and he is an avid reader.  Protocols used: MEDICATION QUESTION CALL-A-AH

## 2017-10-01 ENCOUNTER — Other Ambulatory Visit: Payer: Self-pay | Admitting: Primary Care

## 2017-10-07 ENCOUNTER — Other Ambulatory Visit: Payer: Medicare Other

## 2017-10-15 DIAGNOSIS — M9903 Segmental and somatic dysfunction of lumbar region: Secondary | ICD-10-CM | POA: Diagnosis not present

## 2017-10-15 DIAGNOSIS — M5136 Other intervertebral disc degeneration, lumbar region: Secondary | ICD-10-CM | POA: Diagnosis not present

## 2017-10-15 DIAGNOSIS — M9905 Segmental and somatic dysfunction of pelvic region: Secondary | ICD-10-CM | POA: Diagnosis not present

## 2017-10-15 DIAGNOSIS — M5416 Radiculopathy, lumbar region: Secondary | ICD-10-CM | POA: Diagnosis not present

## 2017-11-13 ENCOUNTER — Ambulatory Visit: Payer: Self-pay | Admitting: *Deleted

## 2017-11-13 NOTE — Telephone Encounter (Signed)
I will see him then Here is a heads up to Kate/PCP

## 2017-11-13 NOTE — Telephone Encounter (Signed)
Patient is complaining of decreasing stamina. Patient also has tingling in hands- to the point he has itching of his hands- no swelling. He also reports a mild headaches for 3 weeks.patient  States if he works too hard he could feel faint. He is suspicious of an underlying cardiac problem.   Reason for Disposition . [1] MODERATE weakness (i.e., interferes with work, school, normal activities) AND [2] persists > 3 days  Answer Assessment - Initial Assessment Questions 1. DESCRIPTION: "Describe how you are feeling."     Just can't get energy back- even after resting 2. SEVERITY: "How bad is it?"  "Can you stand and walk?"   - MILD - Feels weak or tired, but does not interfere with work, school or normal activities   - [place] to stand and walk; weakness interferes with work, school, or normal activities   - SEVERE - Unable to stand or walk     moderate 3. ONSET:  "When did the weakness begin?"     progressive over last year- patient has been toughing it out and it is really interfering 4. CAUSE: "What do you think is causing the weakness?"     ? heart 5. MEDICINES: "Have you recently started a new medicine or had a change in the amount of a medicine?"     No change in over 1 month- did drop statin- did seem to help lift fog 6. OTHER SYMPTOMS: "Do you have any other symptoms?" (e.g., chest pain, fever, cough, SOB, vomiting, diarrhea, bleeding)     Cough- pollen, 3-4 weeks ago stabbing pain L of sternum- 4 second event lasted 20 minutes, SOB- when he is moving briskly 7. PREGNANCY: "Is there any chance you are pregnant?" "When was your last menstrual period?"     n/a  Protocols used: WEAKNESS (GENERALIZED) AND FATIGUE-A-AH

## 2017-11-13 NOTE — Telephone Encounter (Signed)
Pt has 30' appt scheduled for 11/14/17 at 9:30 with Dr Glori Bickers.

## 2017-11-14 ENCOUNTER — Encounter: Payer: Self-pay | Admitting: Family Medicine

## 2017-11-14 ENCOUNTER — Ambulatory Visit (INDEPENDENT_AMBULATORY_CARE_PROVIDER_SITE_OTHER): Payer: Medicare Other | Admitting: Family Medicine

## 2017-11-14 VITALS — BP 146/78 | HR 78 | Temp 98.3°F | Ht 76.0 in | Wt 196.0 lb

## 2017-11-14 DIAGNOSIS — Z720 Tobacco use: Secondary | ICD-10-CM

## 2017-11-14 DIAGNOSIS — I70212 Atherosclerosis of native arteries of extremities with intermittent claudication, left leg: Secondary | ICD-10-CM | POA: Diagnosis not present

## 2017-11-14 DIAGNOSIS — R0789 Other chest pain: Secondary | ICD-10-CM

## 2017-11-14 DIAGNOSIS — R5383 Other fatigue: Secondary | ICD-10-CM

## 2017-11-14 DIAGNOSIS — R7303 Prediabetes: Secondary | ICD-10-CM | POA: Diagnosis not present

## 2017-11-14 DIAGNOSIS — R400 Somnolence: Secondary | ICD-10-CM | POA: Insufficient documentation

## 2017-11-14 LAB — CBC WITH DIFFERENTIAL/PLATELET
BASOS ABS: 0.1 10*3/uL (ref 0.0–0.1)
Basophils Relative: 0.8 % (ref 0.0–3.0)
Eosinophils Absolute: 0.1 10*3/uL (ref 0.0–0.7)
Eosinophils Relative: 1.4 % (ref 0.0–5.0)
HCT: 44.5 % (ref 39.0–52.0)
Hemoglobin: 15.4 g/dL (ref 13.0–17.0)
LYMPHS ABS: 2.3 10*3/uL (ref 0.7–4.0)
Lymphocytes Relative: 23.8 % (ref 12.0–46.0)
MCHC: 34.6 g/dL (ref 30.0–36.0)
MCV: 91.6 fl (ref 78.0–100.0)
MONO ABS: 0.8 10*3/uL (ref 0.1–1.0)
MONOS PCT: 8.5 % (ref 3.0–12.0)
NEUTROS ABS: 6.3 10*3/uL (ref 1.4–7.7)
NEUTROS PCT: 65.5 % (ref 43.0–77.0)
PLATELETS: 250 10*3/uL (ref 150.0–400.0)
RBC: 4.86 Mil/uL (ref 4.22–5.81)
RDW: 13.8 % (ref 11.5–15.5)
WBC: 9.6 10*3/uL (ref 4.0–10.5)

## 2017-11-14 LAB — COMPREHENSIVE METABOLIC PANEL
ALT: 11 U/L (ref 0–53)
AST: 14 U/L (ref 0–37)
Albumin: 4.2 g/dL (ref 3.5–5.2)
Alkaline Phosphatase: 84 U/L (ref 39–117)
BUN: 14 mg/dL (ref 6–23)
CO2: 28 meq/L (ref 19–32)
Calcium: 10 mg/dL (ref 8.4–10.5)
Chloride: 108 mEq/L (ref 96–112)
Creatinine, Ser: 1.54 mg/dL — ABNORMAL HIGH (ref 0.40–1.50)
GFR: 47.87 mL/min — AB (ref >60.00)
GLUCOSE: 98 mg/dL (ref 70–99)
POTASSIUM: 5.4 meq/L — AB (ref 3.5–5.1)
SODIUM: 136 meq/L (ref 135–145)
Total Bilirubin: 0.5 mg/dL (ref 0.2–1.2)
Total Protein: 7.1 g/dL (ref 6.0–8.3)

## 2017-11-14 LAB — HEMOGLOBIN A1C: HEMOGLOBIN A1C: 6 % (ref 4.6–6.5)

## 2017-11-14 LAB — VITAMIN D 25 HYDROXY (VIT D DEFICIENCY, FRACTURES): VITD: 28.91 ng/mL — ABNORMAL LOW (ref 30.00–100.00)

## 2017-11-14 LAB — VITAMIN B12: VITAMIN B 12: 231 pg/mL (ref 211–911)

## 2017-11-14 LAB — TSH: TSH: 0.61 u[IU]/mL (ref 0.35–4.50)

## 2017-11-14 NOTE — Assessment & Plan Note (Signed)
Sleep apnea is in differential  Consider sleep eval

## 2017-11-14 NOTE — Patient Instructions (Addendum)
Aim for 64 oz of fluid per day (mostly water) Please try to cut down on caffeine  Think about quitting smoking   Let's do some labs including vitamin D and B12 level  Also EKG   If all looks good I would like to consider a sleep apnea evaluation

## 2017-11-14 NOTE — Assessment & Plan Note (Signed)
W/o assoc symptoms  Atypical  High risk for cardiac dz - smoker with known vascular dz  EKG is reassuring today  Low threshold to eval further

## 2017-11-14 NOTE — Assessment & Plan Note (Addendum)
Acute on chronic  Significant sleepiness/nodding off and non restorative sleep though he does not know if he snores Sleep apnea is in the differential -handout given and risks discussed  Also cardiac dz  Hx of depression but denies now  Smoker-gets screened for lung ca per pt  Lab today (has been almost a year) incl vit D and B12  Pending results  Consider sleep clinic eval  Also cardiology eval if chest discomfort returns- pt aware to tell us  Strongly urged to quit smoking

## 2017-11-14 NOTE — Progress Notes (Signed)
Subjective:    Patient ID: Nathaniel [name], male    DOB: 21-Apr-1951, 67 y.o.   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 and weakness  This is actually chronic   He last felt like this 10 years ago - stopped when he quit work   Now he fatigues very easily  About 1 1/2 months (few years before that)  Getting worse  Physically all over tired  He can perform regular daily activities for 30 minutes before he has to rest  Then has to rest for 2 hours (sleeps)- and not really restorative  Wakes up exhausted every am   Does not snore that he know of but not sure  His wife has a cpap machine   Thinks mood is fine  No new stress except health  Had chest pain for 15-20 seconds 2 weeks ago   Has in the past - went for psych eval in the past and was actually dx with sleep deprivation  This is when he was working night shift and collapsed at work  In the past prozac - made him more depressed   No heart problems  No heart testing  Last EKG 2016  Lab Results  Component Value Date   WBC 12.6 (H) 11/23/2016   HGB 14.6 11/23/2016   HCT 43.8 11/23/2016   MCV 92.0 11/23/2016   PLT 301 11/23/2016    Lab Results  Component Value Date   CREATININE 1.19 04/30/2017   BUN 12 04/30/2017   NA 137 02/07/2017   K 4.4 02/07/2017   CL 102 02/07/2017   CO2 30 02/07/2017   Lab Results  Component Value Date   ALT 12 02/07/2017   AST 14 02/07/2017   ALKPHOS 107 02/07/2017   BILITOT 0.5 02/07/2017    Lab Results  Component Value Date   TSH 1.08 11/23/2016    Lab Results  Component Value Date   HGBA1C 5.5 11/23/2016   Not enough fluids  10 cups of coffee per day  1/2 gallon of sweet tea    He is a Doctor, general practice  Gets stung frequently    Wt Readings from Last 3 Encounters:  11/14/17 196 lb (88.9 kg)  08/05/17 200 lb 12 oz (91.1 kg)  05/02/17 200 lb (90.7 kg)   23.86 kg/m   Smoking status - 1 ppd   BP Readings from Last 3 Encounters:  11/14/17 (!)  146/78  08/05/17 128/80  05/02/17 107/86   Pulse Readings from Last 3 Encounters:  11/14/17 78  08/05/17 92  05/02/17 (!) 54   Patient Active Problem List   Diagnosis Date Noted  . Somnolence 11/14/2017  . Chest discomfort 11/14/2017  . Medicare annual wellness visit, subsequent 02/05/2017  . Atherosclerosis of native arteries of extremity with intermittent claudication ([place]) 01/22/2017  . Fatigue 11/23/2016  . Pain in both lower extremities 11/23/2016  . Prediabetes 11/23/2016  . Inguinal hernia 11/23/2016  . Medicare annual wellness visit, initial 01/16/2016  . Hyperlipidemia 01/16/2016  . Welcome to Medicare preventive visit 01/17/2015  . Tremor of both hands 12/14/2014  . Skin abnormalities 11/16/2014  . Tobacco abuse 11/16/2014  . GERD (gastroesophageal reflux disease) 11/16/2014   Past Medical History:  Diagnosis Date  . Chicken pox   . GERD (gastroesophageal reflux disease)   . Hyperlipidemia   . Pre-diabetes   . Seizures ([place])    None since age 1   Past Surgical History:  Procedure Laterality  Date  . BACK SURGERY    . LAMINECTOMY  1987  . LOWER EXTREMITY ANGIOGRAPHY Left 05/02/2017   Procedure: Lower Extremity Angiography;  Surgeon: Algernon Huxley, MD;  Location: [facility];  Service: Cardiovascular;  Laterality: Left;  . TONSILLECTOMY     Social History   Tobacco Use  . Smoking status: Current Every Day Smoker    Packs/day: 1.00    Years: 50.00    Pack years: 50.00    Types: Cigarettes  . Smokeless tobacco: Never Used  Substance Use Topics  . Alcohol use: Yes    Alcohol/week: 0.0 oz    Comment: scotch twice a year.  . Drug use: No   Family History  Problem Relation Age of Onset  . Heart disease Mother        Arrthymia   . Stroke Father        Deceased  . Dementia Father   . Heart disease Brother        Myocardial infarction  . Colon cancer Neg Hx    Allergies  Allergen Reactions  . Prozac [Fluoxetine Hcl] Other (See Comments)      Extreme depression  . Statins    Current Outpatient Medications on File Prior to Visit  Medication Sig Dispense Refill  . clonazePAM (KLONOPIN) 0.5 MG tablet Take 1/2 tablet by mouth daily for three days as needed for tremors. 15 tablet 0  . clopidogrel (PLAVIX) 75 MG tablet TAKE 1 TABLET BY MOUTH EVERY DAY 30 tablet 6  . diphenhydrAMINE (BENADRYL) 25 MG tablet Take 25 mg by mouth every 6 (six) hours as needed for itching.    . esomeprazole (NEXIUM) 20 MG capsule Take 20 mg by mouth daily as needed (heartburn).     . Naproxen Sod-Diphenhydramine (ALEVE PM PO) Take by mouth at bedtime.     No current facility-administered medications on file prior to visit.     Review of Systems  Constitutional: Positive for fatigue. Negative for activity change, appetite change, chills, diaphoresis, fever and unexpected weight change.  HENT: Negative for congestion, rhinorrhea, sore throat and trouble swallowing.   Eyes: Negative for pain, redness, itching and visual disturbance.  Respiratory: Negative for cough, chest tightness, shortness of breath, wheezing and stridor.   Cardiovascular: Positive for chest pain. Negative for palpitations and leg swelling.  Gastrointestinal: Negative for abdominal pain, blood in stool, constipation, diarrhea and nausea.  Endocrine: Negative for cold intolerance, heat intolerance, polydipsia and polyuria.  Genitourinary: Negative for difficulty urinating, dysuria, frequency and urgency.  Musculoskeletal: Negative for arthralgias, joint swelling and myalgias.  Skin: Negative for pallor and rash.  Neurological: Negative for dizziness, tremors, weakness, numbness and headaches.  Hematological: Negative for adenopathy. Does not bruise/bleed easily.  Psychiatric/Behavioral: Negative for confusion, decreased concentration, dysphoric mood and sleep disturbance. The patient is not nervous/anxious.        Objective:   Physical Exam  Constitutional: He appears  well-developed and well-nourished. No distress.  Well appearing   HENT:  Head: Normocephalic and atraumatic.  Mouth/Throat: Oropharynx is clear and moist.  Eyes: Pupils are equal, round, and reactive to light. Conjunctivae and EOM are normal. Right eye exhibits no discharge. Left eye exhibits no discharge. No scleral icterus.  Neck: Normal range of motion. Neck supple. No JVD present. Carotid bruit is not present. No thyromegaly present.  Cardiovascular: Normal rate, regular rhythm, normal heart sounds and intact distal pulses. Exam reveals no gallop.  Pulmonary/Chest: Effort normal and breath sounds normal. No respiratory  distress. He has no wheezes. He has no rales. He exhibits no tenderness.  No crackles  Diffusely distant bs No wheeze   Abdominal: Soft. Bowel sounds are normal. He exhibits no distension, no abdominal bruit and no mass. There is no tenderness. There is no rebound and no guarding.  Musculoskeletal: He exhibits no edema.  Lymphadenopathy:    He has no cervical adenopathy.  Neurological: He is alert. He has normal reflexes. No cranial nerve deficit. He exhibits normal muscle tone. Coordination normal.  Skin: Skin is warm and dry. No rash noted. No pallor.  Tanned No rash  Psychiatric: He has a normal mood and affect.          Assessment & Plan:   Problem List Items Addressed This Visit      Other   Chest discomfort    W/o assoc symptoms  Atypical  High risk for cardiac dz - smoker with known vascular dz  EKG is reassuring today  Low threshold to eval further       Relevant Orders   EKG 12-Lead (Completed)   Fatigue - Primary    Acute on chronic  Significant sleepiness/nodding off and non restorative sleep though he does not know if he snores Sleep apnea is in the differential -handout given and risks discussed  Also cardiac dz  Hx of depression but denies now  Smoker-gets screened for lung ca per pt  Lab today (has been almost a year) incl vit D and  B12  Pending results  Consider sleep clinic eval  Also cardiology eval if chest discomfort returns- pt aware to tell us  Strongly urged to quit smoking        Relevant Orders   CBC with Differential/Platelet (Completed)   Comprehensive metabolic panel (Completed)   TSH (Completed)   VITAMIN D 25 Hydroxy (Vit-D Deficiency, Fractures) (Completed)   Vitamin B12 (Completed)   EKG 12-Lead (Completed)   Prediabetes    A1C today with labs Drinks 1/2 gallon of sweet tea per day Not interested in quitting        Relevant Orders   Hemoglobin A1c (Completed)   Somnolence    Sleep apnea is in differential  Consider sleep eval      Relevant Orders   EKG 12-Lead (Completed)   Tobacco abuse    Disc in detail risks of smoking and possible outcomes including copd, vascular/ heart disease, cancer , respiratory and sinus infections  Pt voices understanding Pt is not ready to quit

## 2017-11-14 NOTE — Telephone Encounter (Signed)
Noted  

## 2017-11-14 NOTE — Assessment & Plan Note (Signed)
Disc in detail risks of smoking and possible outcomes including copd, vascular/ heart disease, cancer , respiratory and sinus infections  Pt voices understanding  Pt is not ready to quit  

## 2017-11-14 NOTE — Assessment & Plan Note (Signed)
A1C today with labs Drinks 1/2 gallon of sweet tea per day Not interested in quitting

## 2017-11-19 ENCOUNTER — Encounter: Payer: Self-pay | Admitting: Primary Care

## 2017-11-19 ENCOUNTER — Ambulatory Visit (INDEPENDENT_AMBULATORY_CARE_PROVIDER_SITE_OTHER): Payer: Medicare Other | Admitting: Primary Care

## 2017-11-19 ENCOUNTER — Telehealth: Payer: Self-pay | Admitting: Primary Care

## 2017-11-19 VITALS — BP 124/76 | HR 76 | Temp 97.9°F | Ht 76.0 in | Wt 197.0 lb

## 2017-11-19 DIAGNOSIS — M5416 Radiculopathy, lumbar region: Secondary | ICD-10-CM | POA: Diagnosis not present

## 2017-11-19 DIAGNOSIS — N289 Disorder of kidney and ureter, unspecified: Secondary | ICD-10-CM | POA: Diagnosis not present

## 2017-11-19 DIAGNOSIS — I70212 Atherosclerosis of native arteries of extremities with intermittent claudication, left leg: Secondary | ICD-10-CM | POA: Diagnosis not present

## 2017-11-19 DIAGNOSIS — R400 Somnolence: Secondary | ICD-10-CM

## 2017-11-19 DIAGNOSIS — M9905 Segmental and somatic dysfunction of pelvic region: Secondary | ICD-10-CM | POA: Diagnosis not present

## 2017-11-19 DIAGNOSIS — R7303 Prediabetes: Secondary | ICD-10-CM

## 2017-11-19 DIAGNOSIS — M9903 Segmental and somatic dysfunction of lumbar region: Secondary | ICD-10-CM | POA: Diagnosis not present

## 2017-11-19 DIAGNOSIS — E860 Dehydration: Secondary | ICD-10-CM | POA: Diagnosis not present

## 2017-11-19 DIAGNOSIS — R0789 Other chest pain: Secondary | ICD-10-CM

## 2017-11-19 DIAGNOSIS — M5136 Other intervertebral disc degeneration, lumbar region: Secondary | ICD-10-CM | POA: Diagnosis not present

## 2017-11-19 DIAGNOSIS — R5383 Other fatigue: Secondary | ICD-10-CM

## 2017-11-19 LAB — BASIC METABOLIC PANEL
BUN: 16 mg/dL (ref 6–23)
CHLORIDE: 102 meq/L (ref 96–112)
CO2: 32 mEq/L (ref 19–32)
Calcium: 9.3 mg/dL (ref 8.4–10.5)
Creatinine, Ser: 1.28 mg/dL (ref 0.40–1.50)
GFR: 59.26 mL/min — AB (ref >60.00)
Glucose, Bld: 69 mg/dL — ABNORMAL LOW (ref 70–99)
POTASSIUM: 4.2 meq/L (ref 3.5–5.1)
SODIUM: 141 meq/L (ref 135–145)

## 2017-11-19 LAB — CBC
HEMATOCRIT: 42.9 % (ref 39.0–52.0)
HEMOGLOBIN: 14.6 g/dL (ref 13.0–17.0)
MCHC: 34 g/dL (ref 30.0–36.0)
MCV: 91.9 fl (ref 78.0–100.0)
PLATELETS: 234 10*3/uL (ref 150.0–400.0)
RBC: 4.66 Mil/uL (ref 4.22–5.81)
RDW: 13.9 % (ref 11.5–15.5)
WBC: 9.1 10*3/uL (ref 4.0–10.5)

## 2017-11-19 NOTE — Patient Instructions (Addendum)
Stop by the lab prior to leaving today. I will notify you of your results once received.   Start working on exercise through walking as discussed. Increase duration/distance every 1-2 days as tolerated.  Ensure you are consuming 64 ounces of water daily, keep up the great work!  Please notify me if you do not continue to notice improvement in stamina and energy.  It was a pleasure to see you today! Have a great trip!

## 2017-11-19 NOTE — Assessment & Plan Note (Signed)
Chronic for 10 years. Labs from last visit reviewed. Repeat BMP, add on CBC. He will call back with the dose of Vitamin D, continue this.  Suspect the majority of his symptoms are seondary to dehydration and deconditioning. Continue to work on increased water consumption as he's already noticing an improvement; limit sweet tea/coffee. Also recommended he start walking daily and increase duration/distance every 1-2 days.   Epworth Sleepiness Scale score of 5 day, continue to keep sleep apnea on differential list.   Repeat A1C and vitamin D during up coming CPE.

## 2017-11-19 NOTE — Assessment & Plan Note (Signed)
No chest pain since last visit. Rate and rhythm regular today. Consider cardiology referral if fatigue does not gradually improve, or if he develops recurring chest pain.

## 2017-11-19 NOTE — Telephone Encounter (Signed)
Copied from [place]. Topic: Quick Communication - See Telephone Encounter >> Nov 19, 2017  1:26 PM Vernona Rieger wrote: CRM for notification. See Telephone encounter for: 11/19/17.   Patient said that he is taking 1000 units vitamin D3. Alma Friendly wanted him to check this when he got home.

## 2017-11-19 NOTE — Progress Notes (Signed)
Subjective:    Patient ID: Nathaniel [name], male    DOB: 02-03-1951, 67 y.o.   MRN: [id number]  HPI  Nathaniel [name] is a 67 year old male who presents today for follow up of fatigue.  He presented to the clinic last week, evaluated by Dr. Glori Bickers, for a chief complaint of chronic fatigue and weakness.   During his last visit:  He endorsed a 10 year history of fatigue after retiring from his job. He tires easily and will have to rest after 30 minutes of daily activities. He will also nap during the day, 2 hours in duration, doesn't feel rested. He wakes in the morning and doesn't feel well rested. He denied anxiety/depression. He did endorse a history of chest pain 2 weeks prior, ECG unremarkable last week. He admitted to little water intake as he mostly drinks coffee and sweet tea. Labs during his last visit showed increased creatinine which was presumed to be from little water intake, consistent caffeine/tea intake, and Ibuprofen use. His vitamin D level was low so he was encouraged to start 5000 units daily. Vitamin B12 was low. He was in the prediabetic range.  Since his last visit he's been working on increasing "fluid" intake. He's drinking 48 ounces of water, 16 ounces of coffee, and 16 ounces of sweet tea daily. He continues to smoke. He's not taking Ibuprofen. He started Vitamin D daily as recommended, he's not sure of the dose.  He has noticed waking up feeling more alert and rested. He continues to feel fatigued after 30 minutes of doing his daily activities. If he's in a crowd of other people then he can interact for a few hours due to "adrenaline" but will "pay for it" the following day. He is not exercising but plans on starting on walking as he has a trip planned to [place] in 3 weeks. His Epworth Sleepiness Scale score is 5 today.  He denies chest pain, dizziness, fevers, rectal bleeding, depression/anxiety.   Review of Systems  Constitutional: Positive for fatigue.  Negative for unexpected weight change.  Respiratory: Negative for cough and shortness of breath.   Cardiovascular: Negative for chest pain and palpitations.  Gastrointestinal: Negative for blood in stool.  Neurological: Negative for dizziness and headaches.  Psychiatric/Behavioral: The patient is not nervous/anxious.        Past Medical History:  Diagnosis Date  . Chicken pox   . GERD (gastroesophageal reflux disease)   . Hyperlipidemia   . Pre-diabetes   . Seizures ([place])    None since age 78     Social History   Socioeconomic History  . Marital status: Married    Spouse name: Not on file  . Number of children: Not on file  . Years of education: Not on file  . Highest education level: Not on file  Occupational History  . Not on file  Social Needs  . Financial resource strain: Not on file  . Food insecurity:    Worry: Not on file    Inability: Not on file  . Transportation needs:    Medical: Not on file    Non-medical: Not on file  Tobacco Use  . Smoking status: Current Every Day Smoker    Packs/day: 1.00    Years: 50.00    Pack years: 50.00    Types: Cigarettes  . Smokeless tobacco: Never Used  Substance and Sexual Activity  . Alcohol use: Yes    Alcohol/week: 0.0 oz    Comment:  scotch twice a year.  . Drug use: No  . Sexual activity: Not on file  Lifestyle  . Physical activity:    Days per week: Not on file    Minutes per session: Not on file  . Stress: Not on file  Relationships  . Social connections:    Talks on phone: Not on file    Gets together: Not on file    Attends religious service: Not on file    Active member of club or organization: Not on file    Attends meetings of clubs or organizations: Not on file    Relationship status: Not on file  . Intimate partner violence:    Fear of current or ex partner: Not on file    Emotionally abused: Not on file    Physically abused: Not on file    Forced sexual activity: Not on file  Other Topics  Concern  . Not on file  Social History Narrative   Retired.   Stays busy with household work, Surveyor, mining.   Married for 3 years.   Grown children.   Enjoys reading.     Past Surgical History:  Procedure Laterality Date  . BACK SURGERY    . LAMINECTOMY  1987  . LOWER EXTREMITY ANGIOGRAPHY Left 05/02/2017   Procedure: Lower Extremity Angiography;  Surgeon: Algernon Huxley, MD;  Location: [facility];  Service: Cardiovascular;  Laterality: Left;  . TONSILLECTOMY      Family History  Problem Relation Age of Onset  . Heart disease Mother        Arrthymia   . Stroke Father        Deceased  . Dementia Father   . Heart disease Brother        Myocardial infarction  . Colon cancer Neg Hx     Allergies  Allergen Reactions  . Prozac [Fluoxetine Hcl] Other (See Comments)    Extreme depression  . Statins     Current Outpatient Medications on File Prior to Visit  Medication Sig Dispense Refill  . clonazePAM (KLONOPIN) 0.5 MG tablet Take 1/2 tablet by mouth daily for three days as needed for tremors. 15 tablet 0  . clopidogrel (PLAVIX) 75 MG tablet TAKE 1 TABLET BY MOUTH EVERY DAY 30 tablet 6  . diphenhydrAMINE (BENADRYL) 25 MG tablet Take 25 mg by mouth every 6 (six) hours as needed for itching.    . esomeprazole (NEXIUM) 20 MG capsule Take 20 mg by mouth daily as needed (heartburn).     . Naproxen Sod-Diphenhydramine (ALEVE PM PO) Take by mouth at bedtime.     No current facility-administered medications on file prior to visit.     BP 124/76   Pulse 76   Temp 97.9 F (36.6 C) (Oral)   Ht 6\' 4"  (1.93 m)   Wt 197 lb (89.4 kg)   SpO2 96%   BMI 23.98 kg/m    Objective:   Physical Exam  Constitutional: He is oriented to person, place, and time. He appears well-nourished.  Cardiovascular: Normal rate and regular rhythm.  Pulmonary/Chest: Effort normal and breath sounds normal.  Musculoskeletal: Normal range of motion.  Neurological: He is alert and oriented to  person, place, and time.  Skin: Skin is warm and dry.          Assessment & Plan:

## 2017-11-19 NOTE — Assessment & Plan Note (Signed)
Recent jump in A1C to 6.0. He is already limiting sweet tea consumption, commended him on this. Will repeat in 3 months.

## 2017-11-19 NOTE — Assessment & Plan Note (Signed)
Epworth Sleepiness Scale score of 5 today. Consider sleep evaluation if no continued improvement in symptoms.

## 2017-11-19 NOTE — Telephone Encounter (Signed)
Noted. Continue same.

## 2017-12-02 DIAGNOSIS — Z85828 Personal history of other malignant neoplasm of skin: Secondary | ICD-10-CM | POA: Diagnosis not present

## 2017-12-02 DIAGNOSIS — L570 Actinic keratosis: Secondary | ICD-10-CM | POA: Diagnosis not present

## 2017-12-02 DIAGNOSIS — D225 Melanocytic nevi of trunk: Secondary | ICD-10-CM | POA: Diagnosis not present

## 2017-12-02 DIAGNOSIS — D485 Neoplasm of uncertain behavior of skin: Secondary | ICD-10-CM | POA: Diagnosis not present

## 2017-12-02 DIAGNOSIS — Z08 Encounter for follow-up examination after completed treatment for malignant neoplasm: Secondary | ICD-10-CM | POA: Diagnosis not present

## 2017-12-02 DIAGNOSIS — L820 Inflamed seborrheic keratosis: Secondary | ICD-10-CM | POA: Diagnosis not present

## 2017-12-02 DIAGNOSIS — L821 Other seborrheic keratosis: Secondary | ICD-10-CM | POA: Diagnosis not present

## 2017-12-02 DIAGNOSIS — X32XXXA Exposure to sunlight, initial encounter: Secondary | ICD-10-CM | POA: Diagnosis not present

## 2017-12-02 DIAGNOSIS — Z85820 Personal history of malignant melanoma of skin: Secondary | ICD-10-CM | POA: Diagnosis not present

## 2017-12-03 ENCOUNTER — Other Ambulatory Visit (INDEPENDENT_AMBULATORY_CARE_PROVIDER_SITE_OTHER): Payer: Self-pay | Admitting: Vascular Surgery

## 2017-12-03 ENCOUNTER — Ambulatory Visit (INDEPENDENT_AMBULATORY_CARE_PROVIDER_SITE_OTHER): Payer: Medicare Other | Admitting: Vascular Surgery

## 2017-12-03 ENCOUNTER — Encounter (INDEPENDENT_AMBULATORY_CARE_PROVIDER_SITE_OTHER): Payer: Self-pay | Admitting: Vascular Surgery

## 2017-12-03 ENCOUNTER — Ambulatory Visit (INDEPENDENT_AMBULATORY_CARE_PROVIDER_SITE_OTHER): Payer: Medicare Other

## 2017-12-03 VITALS — BP 122/83 | HR 62 | Resp 13 | Ht 76.0 in | Wt 195.0 lb

## 2017-12-03 DIAGNOSIS — I70212 Atherosclerosis of native arteries of extremities with intermittent claudication, left leg: Secondary | ICD-10-CM

## 2017-12-03 DIAGNOSIS — Z95820 Peripheral vascular angioplasty status with implants and grafts: Secondary | ICD-10-CM

## 2017-12-03 DIAGNOSIS — E785 Hyperlipidemia, unspecified: Secondary | ICD-10-CM

## 2017-12-03 DIAGNOSIS — Z9862 Peripheral vascular angioplasty status: Secondary | ICD-10-CM

## 2017-12-03 DIAGNOSIS — K219 Gastro-esophageal reflux disease without esophagitis: Secondary | ICD-10-CM

## 2017-12-03 NOTE — Progress Notes (Signed)
MRN : [id number]  Nathaniel [name] is a 67 y.o. (10-09-1950) male who presents with chief complaint of  Chief Complaint  Patient presents with  . Follow-up    6 month ABI and arterial  .  History of Present Illness: Patient returns today in follow up of PAD.  He is about 6 months status post right iliac and left lower extremity intervention for short distance claudication on the left.  He has done well.  He had no periprocedural complications.  He has had a marked improvement in his lower extremity symptoms. Noninvasive studies demonstrate normal ABIs of 1.2 bilaterally with normal digital waveforms and pressures.  Duplex demonstrates no stenosis in the previous intervention sites.  Current Outpatient Medications  Medication Sig Dispense Refill  . clopidogrel (PLAVIX) 75 MG tablet TAKE 1 TABLET BY MOUTH EVERY DAY 30 tablet 6  . clonazePAM (KLONOPIN) 0.5 MG tablet Take 1/2 tablet by mouth daily for three days as needed for tremors. (Patient not taking: Reported on 12/03/2017) 15 tablet 0  . diphenhydrAMINE (BENADRYL) 25 MG tablet Take 25 mg by mouth every 6 (six) hours as needed for itching.    . esomeprazole (NEXIUM) 20 MG capsule Take 20 mg by mouth daily as needed (heartburn).     . Naproxen Sod-Diphenhydramine (ALEVE PM PO) Take by mouth at bedtime.     No current facility-administered medications for this visit.     Past Medical History:  Diagnosis Date  . Chicken pox   . GERD (gastroesophageal reflux disease)   . Hyperlipidemia   . Pre-diabetes   . Seizures ([place])    None since age 48    Past Surgical History:  Procedure Laterality Date  . BACK SURGERY    . LAMINECTOMY  1987  . LOWER EXTREMITY ANGIOGRAPHY Left 05/02/2017   Procedure: Lower Extremity Angiography;  Surgeon: Algernon Huxley, MD;  Location: [facility];  Service: Cardiovascular;  Laterality: Left;  . TONSILLECTOMY      Social History Social History   Tobacco Use  . Smoking status: Current Every  Day Smoker    Packs/day: 1.00    Years: 50.00    Pack years: 50.00    Types: Cigarettes  . Smokeless tobacco: Never Used  Substance Use Topics  . Alcohol use: Yes    Alcohol/week: 0.0 oz    Comment: scotch twice a year.  . Drug use: No    Family History Family History  Problem Relation Age of Onset  . Heart disease Mother        Arrthymia   . Stroke Father        Deceased  . Dementia Father   . Heart disease Brother        Myocardial infarction  . Colon cancer Neg Hx     Allergies  Allergen Reactions  . Prozac [Fluoxetine Hcl] Other (See Comments)    Extreme depression  . Statins     REVIEW OF SYSTEMS (Negative unless checked)  Constitutional: [] Weight loss  [] Fever  [] Chills Cardiac: [] Chest pain   [] Chest pressure   [] Palpitations   [] Shortness of breath when laying flat   [] Shortness of breath at rest   [] Shortness of breath with exertion. Vascular:  [x] Pain in legs with walking   [] Pain in legs at rest   [] Pain in legs when laying flat   [x] Claudication   [] Pain in feet when walking  [] Pain in feet at rest  [] Pain in feet when laying flat   []   History of DVT   [] Phlebitis   [] Swelling in legs   [] Varicose veins   [] Non-healing ulcers Pulmonary:   [] Uses home oxygen   [] Productive cough   [] Hemoptysis   [] Wheeze  [] COPD   [] Asthma Neurologic:  [x] Dizziness  [] Blackouts   [x] Seizures   [] History of stroke   [] History of TIA  [] Aphasia   [] Temporary blindness   [] Dysphagia   [] Weakness or numbness in arms   [] Weakness or numbness in legs Musculoskeletal:  [x] Arthritis   [] Joint swelling   [] Joint pain   [] Low back pain Hematologic:  [] Easy bruising  [] Easy bleeding   [] Hypercoagulable state   [] Anemic  [] Hepatitis Gastrointestinal:  [] Blood in stool   [] Vomiting blood  [x] Gastroesophageal reflux/heartburn   [] Abdominal pain Genitourinary:  [] Chronic kidney disease   [] Difficult urination  [] Frequent urination  [] Burning with urination   [] Hematuria Skin:  [] Rashes    [] Ulcers   [] Wounds Psychological:  [] History of anxiety   []  History of major depression.     Physical Examination  BP 122/83 (BP Location: Right Arm, Patient Position: Sitting)   Pulse 62   Resp 13   Ht 6\' 4"  (1.93 m)   Wt 195 lb (88.5 kg)   BMI 23.74 kg/m  Gen: Tall and thin, NAD Head: [place]/AT, No temporalis wasting. Ear/Nose/Throat: Hearing grossly intact, nares w/o erythema or drainage Eyes: Conjunctiva clear. Sclera non-icteric Neck: Supple.  Trachea midline Pulmonary:  Good air movement, no use of accessory muscles.  Cardiac: RRR, no JVD Vascular:  Vessel Right Left  Radial Palpable Palpable                          PT Palpable Palpable  DP Palpable Palpable    Musculoskeletal: M/S 5/5 throughout.  No deformity or atrophy.  No edema. Neurologic: Sensation grossly intact in extremities.  Symmetrical.  Speech is fluent.  Psychiatric: Judgment intact, Mood & affect appropriate for pt's clinical situation. Dermatologic: No rashes or ulcers noted.  No cellulitis or open wounds.       Labs Recent Results (from the past 2160 hour(s))  CBC with Differential/Platelet     Status: None   Collection Time: 11/14/17 10:35 AM  Result Value Ref Range   WBC 9.6 4.0 - 10.5 K/uL   RBC 4.86 4.22 - 5.81 Mil/uL   Hemoglobin 15.4 13.0 - 17.0 g/dL   HCT 44.5 39.0 - 52.0 %   MCV 91.6 78.0 - 100.0 fl   MCHC 34.6 30.0 - 36.0 g/dL   RDW 13.8 11.5 - 15.5 %   Platelets 250.0 150.0 - 400.0 K/uL   Neutrophils Relative % 65.5 43.0 - 77.0 %   Lymphocytes Relative 23.8 12.0 - 46.0 %   Monocytes Relative 8.5 3.0 - 12.0 %   Eosinophils Relative 1.4 0.0 - 5.0 %   Basophils Relative 0.8 0.0 - 3.0 %   Neutro Abs 6.3 1.4 - 7.7 K/uL   Lymphs Abs 2.3 0.7 - 4.0 K/uL   Monocytes Absolute 0.8 0.1 - 1.0 K/uL   Eosinophils Absolute 0.1 0.0 - 0.7 K/uL   Basophils Absolute 0.1 0.0 - 0.1 K/uL  Comprehensive metabolic panel     Status: Abnormal   Collection Time: 11/14/17 10:35 AM  Result  Value Ref Range   Sodium 136 135 - 145 mEq/L   Potassium 5.4 (H) 3.5 - 5.1 mEq/L   Chloride 108 96 - 112 mEq/L   CO2 28 19 - 32 mEq/L   Glucose,  Bld 98 70 - 99 mg/dL   BUN 14 6 - 23 mg/dL   Creatinine, Ser 1.54 (H) 0.40 - 1.50 mg/dL   Total Bilirubin 0.5 0.2 - 1.2 mg/dL   Alkaline Phosphatase 84 39 - 117 U/L   AST 14 0 - 37 U/L   ALT 11 0 - 53 U/L   Total Protein 7.1 6.0 - 8.3 g/dL   Albumin 4.2 3.5 - 5.2 g/dL   Calcium 10.0 8.4 - 10.5 mg/dL   GFR 47.87 (L) >60.00 mL/min  TSH     Status: None   Collection Time: 11/14/17 10:35 AM  Result Value Ref Range   TSH 0.61 0.35 - 4.50 uIU/mL  VITAMIN D 25 Hydroxy (Vit-D Deficiency, Fractures)     Status: Abnormal   Collection Time: 11/14/17 10:35 AM  Result Value Ref Range   VITD 28.91 (L) 30.00 - 100.00 ng/mL  Vitamin B12     Status: None   Collection Time: 11/14/17 10:35 AM  Result Value Ref Range   Vitamin B-12 231 211 - 911 pg/mL  Hemoglobin A1c     Status: None   Collection Time: 11/14/17 10:35 AM  Result Value Ref Range   Hgb A1c MFr Bld 6.0 4.6 - 6.5 %    Comment: Glycemic Control Guidelines for People with Diabetes:Non Diabetic:  <6%Goal of Therapy: <7%Additional Action Suggested:  >7%   Basic metabolic panel     Status: Abnormal   Collection Time: 11/19/17  1:00 PM  Result Value Ref Range   Sodium 141 135 - 145 mEq/L   Potassium 4.2 3.5 - 5.1 mEq/L   Chloride 102 96 - 112 mEq/L   CO2 32 19 - 32 mEq/L   Glucose, Bld 69 (L) 70 - 99 mg/dL   BUN 16 6 - 23 mg/dL   Creatinine, Ser 1.28 0.40 - 1.50 mg/dL   Calcium 9.3 8.4 - 10.5 mg/dL   GFR 59.26 (L) >60.00 mL/min  CBC     Status: None   Collection Time: 11/19/17  1:00 PM  Result Value Ref Range   WBC 9.1 4.0 - 10.5 K/uL   RBC 4.66 4.22 - 5.81 Mil/uL   Platelets 234.0 150.0 - 400.0 K/uL   Hemoglobin 14.6 13.0 - 17.0 g/dL   HCT 42.9 39.0 - 52.0 %   MCV 91.9 78.0 - 100.0 fl   MCHC 34.0 30.0 - 36.0 g/dL   RDW 13.9 11.5 - 15.5 %    Radiology No results  found.  Assessment/Plan Hyperlipidemia lipid control important in reducing the progression of atherosclerotic disease. Intolerant to statins.     GERD (gastroesophageal reflux disease) Continue antihypertensive medications as already ordered, these medications have been reviewed and there are no changes at this time.  Avoidence of caffeine and alcohol  Moderate elevation of the head of the bed   Atherosclerosis of native arteries of extremity with intermittent claudication (HCC) Much better after revascularization last year.  Noninvasive studies demonstrate normal ABIs of 1.2 bilaterally with normal digital waveforms and pressures.  Duplex demonstrates no stenosis in the previous intervention sites.  Overall he is doing well.  Continue Plavix.  Intolerant to statins.  Recheck in 6 months.    Leotis Pain, MD  12/03/2017 9:03 AM    This note was created with Dragon medical transcription system.  Any errors from dictation are purely unintentional

## 2017-12-03 NOTE — Patient Instructions (Signed)

## 2017-12-03 NOTE — Assessment & Plan Note (Signed)
Continue antihypertensive medications as already ordered, these medications have been reviewed and there are no changes at this time.  Avoidence of caffeine and alcohol  Moderate elevation of the head of the bed  

## 2017-12-03 NOTE — Assessment & Plan Note (Signed)
Much better after revascularization last year.  Noninvasive studies demonstrate normal ABIs of 1.2 bilaterally with normal digital waveforms and pressures.  Duplex demonstrates no stenosis in the previous intervention sites.  Overall he is doing well.  Continue Plavix.  Intolerant to statins.  Recheck in 6 months.

## 2017-12-06 ENCOUNTER — Encounter (INDEPENDENT_AMBULATORY_CARE_PROVIDER_SITE_OTHER): Payer: Medicare Other

## 2017-12-06 ENCOUNTER — Ambulatory Visit (INDEPENDENT_AMBULATORY_CARE_PROVIDER_SITE_OTHER): Payer: Medicare Other | Admitting: Vascular Surgery

## 2017-12-17 DIAGNOSIS — M9903 Segmental and somatic dysfunction of lumbar region: Secondary | ICD-10-CM | POA: Diagnosis not present

## 2017-12-17 DIAGNOSIS — M5416 Radiculopathy, lumbar region: Secondary | ICD-10-CM | POA: Diagnosis not present

## 2017-12-17 DIAGNOSIS — M5136 Other intervertebral disc degeneration, lumbar region: Secondary | ICD-10-CM | POA: Diagnosis not present

## 2017-12-17 DIAGNOSIS — M9905 Segmental and somatic dysfunction of pelvic region: Secondary | ICD-10-CM | POA: Diagnosis not present

## 2018-01-28 ENCOUNTER — Ambulatory Visit
Admission: RE | Admit: 2018-01-28 | Discharge: 2018-01-28 | Disposition: A | Discharge status: Home / Self Care | Payer: Medicare Other | Source: Ambulatory Visit | Attending: Acute Care | Admitting: Acute Care

## 2018-01-28 DIAGNOSIS — F17210 Nicotine dependence, cigarettes, uncomplicated: Secondary | ICD-10-CM | POA: Diagnosis not present

## 2018-01-28 DIAGNOSIS — Z122 Encounter for screening for malignant neoplasm of respiratory organs: Secondary | ICD-10-CM | POA: Insufficient documentation

## 2018-01-28 DIAGNOSIS — J439 Emphysema, unspecified: Secondary | ICD-10-CM | POA: Diagnosis not present

## 2018-02-06 ENCOUNTER — Other Ambulatory Visit: Payer: Self-pay | Admitting: Acute Care

## 2018-02-06 ENCOUNTER — Encounter: Payer: Self-pay | Admitting: *Deleted

## 2018-02-06 DIAGNOSIS — F17210 Nicotine dependence, cigarettes, uncomplicated: Principal | ICD-10-CM

## 2018-02-06 DIAGNOSIS — Z122 Encounter for screening for malignant neoplasm of respiratory organs: Secondary | ICD-10-CM

## 2018-02-07 ENCOUNTER — Other Ambulatory Visit: Payer: Self-pay | Admitting: Primary Care

## 2018-02-07 DIAGNOSIS — Z125 Encounter for screening for malignant neoplasm of prostate: Secondary | ICD-10-CM

## 2018-02-07 DIAGNOSIS — R7303 Prediabetes: Secondary | ICD-10-CM

## 2018-02-07 DIAGNOSIS — E785 Hyperlipidemia, unspecified: Secondary | ICD-10-CM

## 2018-02-07 DIAGNOSIS — Z1159 Encounter for screening for other viral diseases: Secondary | ICD-10-CM

## 2018-02-10 ENCOUNTER — Ambulatory Visit (INDEPENDENT_AMBULATORY_CARE_PROVIDER_SITE_OTHER): Payer: Medicare Other

## 2018-02-10 VITALS — BP 112/80 | HR 83 | Temp 98.4°F | Ht 74.5 in | Wt 192.0 lb

## 2018-02-10 DIAGNOSIS — Z0000 Encounter for general adult medical examination without abnormal findings: Secondary | ICD-10-CM | POA: Diagnosis not present

## 2018-02-10 DIAGNOSIS — Z125 Encounter for screening for malignant neoplasm of prostate: Secondary | ICD-10-CM

## 2018-02-10 DIAGNOSIS — E785 Hyperlipidemia, unspecified: Secondary | ICD-10-CM | POA: Diagnosis not present

## 2018-02-10 DIAGNOSIS — Z1159 Encounter for screening for other viral diseases: Secondary | ICD-10-CM

## 2018-02-10 DIAGNOSIS — R7303 Prediabetes: Secondary | ICD-10-CM

## 2018-02-10 LAB — LIPID PANEL
CHOLESTEROL: 226 mg/dL — AB (ref 0–200)
HDL: 40.9 mg/dL (ref >39.00)
LDL Cholesterol: 158 mg/dL — ABNORMAL HIGH (ref 0–99)
NonHDL: 184.78
Total CHOL/HDL Ratio: 6
Triglycerides: 136 mg/dL (ref 0.0–149.0)
VLDL: 27.2 mg/dL (ref 0.0–40.0)

## 2018-02-10 LAB — HEMOGLOBIN A1C: Hgb A1c MFr Bld: 6.1 % (ref 4.6–6.5)

## 2018-02-10 LAB — BASIC METABOLIC PANEL
BUN: 9 mg/dL (ref 6–23)
CALCIUM: 9.2 mg/dL (ref 8.4–10.5)
CO2: 31 meq/L (ref 19–32)
Chloride: 103 mEq/L (ref 96–112)
Creatinine, Ser: 1.1 mg/dL (ref 0.40–1.50)
GFR: 70.54 mL/min (ref >60.00)
Glucose, Bld: 96 mg/dL (ref 70–99)
POTASSIUM: 5 meq/L (ref 3.5–5.1)
SODIUM: 140 meq/L (ref 135–145)

## 2018-02-10 LAB — PSA, MEDICARE: PSA: 1.03 ng/ml (ref 0.10–4.00)

## 2018-02-10 NOTE — Progress Notes (Signed)
Subjective:   Nathaniel [name] is a 67 y.o. male who presents for Medicare Annual/Subsequent preventive examination.  Review of Systems:  N/A Cardiac Risk Factors include: advanced age (>66men, >55 women);male gender;dyslipidemia     Objective:    Vitals: BP 112/80 (BP Location: Right Arm, Patient Position: Sitting, Cuff Size: Normal)   Pulse 83   Temp 98.4 F (36.9 C) (Oral)   Ht 6' 2.5" (1.892 m) Comment: no shoes  Wt 192 lb (87.1 kg)   SpO2 98%   BMI 24.32 kg/m   Body mass index is 24.32 kg/m.  Advanced Directives 02/10/2018 05/02/2017 04/26/2017 01/22/2017 03/09/2015  Does Patient Have a Medical Advance Directive? No No No No No  Would patient like information on creating a medical advance directive? No - Patient declined Yes (Inpatient - patient requests chaplain consult to create a medical advance directive);No - Patient declined - - -    Tobacco Social History   Tobacco Use  Smoking Status Current Every Day Smoker  . Packs/day: 1.00  . Years: 50.00  . Pack years: 50.00  . Types: Cigarettes  Smokeless Tobacco Never Used     Ready to quit: No Counseling given: No   Clinical Intake:  Pre-visit preparation completed: Yes  Pain Score: 2      Nutritional Status: BMI 25 -29 Overweight Nutritional Risks: None Diabetes: No  How often do you need to have someone help you when you read instructions, pamphlets, or other written materials from your doctor or pharmacy?: 1 - Never What is the last grade level you completed in school?: 12th grade  Interpreter Needed?: No  Comments: pt lives with spouse Information entered by :: LPinson, LPN  Past Medical History:  Diagnosis Date  . Cataract   . Chicken pox   . GERD (gastroesophageal reflux disease)   . Hyperlipidemia   . Pre-diabetes   . Seizures ([place])    None since age 56   Past Surgical History:  Procedure Laterality Date  . BACK SURGERY    . LAMINECTOMY  1987  . LOWER EXTREMITY ANGIOGRAPHY Left  05/02/2017   Procedure: Lower Extremity Angiography;  Surgeon: Algernon Huxley, MD;  Location: [facility];  Service: Cardiovascular;  Laterality: Left;  . TONSILLECTOMY     Family History  Problem Relation Age of Onset  . Heart disease Mother        Arrthymia   . Stroke Father        Deceased  . Dementia Father   . Heart disease Brother        Myocardial infarction  . Colon cancer Neg Hx    Social History   Socioeconomic History  . Marital status: Married    Spouse name: Not on file  . Number of children: Not on file  . Years of education: Not on file  . Highest education level: Not on file  Occupational History  . Not on file  Social Needs  . Financial resource strain: Not on file  . Food insecurity:    Worry: Not on file    Inability: Not on file  . Transportation needs:    Medical: Not on file    Non-medical: Not on file  Tobacco Use  . Smoking status: Current Every Day Smoker    Packs/day: 1.00    Years: 50.00    Pack years: 50.00    Types: Cigarettes  . Smokeless tobacco: Never Used  Substance and Sexual Activity  . Alcohol use: Yes  Alcohol/week: 0.0 oz    Comment: scotch twice a year. occassionally beer once every 2-3 mths  . Drug use: No  . Sexual activity: Not on file  Lifestyle  . Physical activity:    Days per week: Not on file    Minutes per session: Not on file  . Stress: Not on file  Relationships  . Social connections:    Talks on phone: Not on file    Gets together: Not on file    Attends religious service: Not on file    Active member of club or organization: Not on file    Attends meetings of clubs or organizations: Not on file    Relationship status: Not on file  Other Topics Concern  . Not on file  Social History Narrative   Retired.   Stays busy with household work, Surveyor, mining.   Married for 3 years.   Grown children.   Enjoys reading.     Outpatient Encounter Medications as of 02/10/2018  Medication Sig  .  clopidogrel (PLAVIX) 75 MG tablet TAKE 1 TABLET BY MOUTH EVERY DAY  . diphenhydrAMINE (BENADRYL) 25 MG tablet Take 25 mg by mouth every 6 (six) hours as needed for itching.  . esomeprazole (NEXIUM) 20 MG capsule Take 20 mg by mouth daily as needed (heartburn).   . Naproxen Sod-Diphenhydramine (ALEVE PM PO) Take by mouth as needed.   . clonazePAM (KLONOPIN) 0.5 MG tablet Take 1/2 tablet by mouth daily for three days as needed for tremors. (Patient not taking: Reported on 12/03/2017)  . [DISCONTINUED] Aspirin-Calcium Carbonate 81-777 MG TABS Take 81 mg by mouth daily.  . [DISCONTINUED] atorvastatin (LIPITOR) 10 MG tablet Take 1 tablet (10 mg total) by mouth daily. (Patient not taking: Reported on 08/05/2017)  . [DISCONTINUED] budesonide-formoterol (SYMBICORT) 160-4.5 MCG/ACT inhaler Inhale 2 puffs into the lungs 2 (two) times daily. (Patient not taking: Reported on 04/26/2017)  . [DISCONTINUED] clopidogrel (PLAVIX) 75 MG tablet Take 75 mg by mouth daily.   . [DISCONTINUED] gemfibrozil (LOPID) 600 MG tablet Take 600 mg by mouth daily.   . [DISCONTINUED] ibuprofen (ADVIL,MOTRIN) 200 MG tablet Take 200 mg by mouth every 6 (six) hours as needed for mild pain (anti inflammatory).   . [DISCONTINUED] tiZANidine (ZANAFLEX) 2 MG tablet Take 1 tablet (2 mg total) by mouth 2 (two) times daily as needed for muscle spasms. (Patient not taking: Reported on 04/26/2017)   No facility-administered encounter medications on file as of 02/10/2018.     Activities of Daily Living In your present state of health, do you have any difficulty performing the following activities: 02/10/2018  Hearing? Y  Vision? Y  Comment floaters, cataracts  Difficulty concentrating or making decisions? N  Walking or climbing stairs? Y  Comment leg pain when walking long distances  Dressing or bathing? N  Doing errands, shopping? N  Preparing Food and eating ? N  Using the Toilet? N  In the past six months, have you accidently leaked  urine? N  Do you have problems with loss of bowel control? N  Managing your Medications? N  Managing your Finances? N  Housekeeping or managing your Housekeeping? N  Some recent data might be hidden    Patient Care Team: Pleas Koch, NP as PCP - General (Nurse Practitioner)   Assessment:   This is a routine wellness examination for [facility].  Hearing Screening Comments: Bilateral hearing aids Vision Screening Comments: Vision exam in August 2018 @ [place]  and Dietary recommendations Current Exercise Habits: Home exercise routine, Type of exercise: walking(3/4 mile), Frequency (Times/Week): 7, Intensity: Mild, Exercise limited by: None identified  Goals    . Increase physical activity     Starting 02/10/2018, I will continue to walk at least 3/4 mile daily.        Fall Risk Fall Risk  02/10/2018 06/27/2017 01/16/2016 01/17/2015  Falls in the past year? No No Yes Yes  Comment - Emmi Telephone Survey: data to providers prior to load - Accidentally tripped  Number falls in past yr: - - 1 1  Injury with Fall? - - No No   Depression Screen PHQ 2/9 Scores 02/10/2018 01/16/2016 01/17/2015  PHQ - 2 Score 1 0 4  PHQ- 9 Score 3 - 7    Cognitive Function MMSE - Mini Mental State Exam 02/10/2018  Orientation to time 5  Orientation to Place 5  Registration 3  Attention/ Calculation 0  Recall 3  Language- name 2 objects 0  Language- repeat 1  Language- follow 3 step command 3  Language- read & follow direction 0  Write a sentence 0  Copy design 0  Total score 20     PLEASE NOTE: A Mini-Cog screen was completed. Maximum score is 20. A value of 0 denotes this part of Folstein MMSE was not completed or the patient failed this part of the Mini-Cog screening.   Mini-Cog Screening Orientation to Time - Max 5 pts Orientation to Place - Max 5 pts Registration - Max 3 pts Recall - Max 3 pts Language Repeat - Max 1 pts Language Follow 3 Step Command  - Max 3 pts    Screening Tests Health Maintenance  Topic Date Due  . COLONOSCOPY  07/22/2018 (Originally 03/22/2016)  . PNA vac Low Risk Adult (1 of 2 - PCV13) 02/10/2049 (Originally 01/26/2014)  . INFLUENZA VACCINE  02/20/2018  . TETANUS/TDAP  07/23/2020  . Hepatitis C Screening  Completed     Plan:     I have personally reviewed, addressed, and noted the following in the patient's chart:  A. Medical and social history B. Use of alcohol, tobacco or illicit drugs  C. Current medications and supplements D. Functional ability and status E.  Nutritional status F.  Physical activity G. Advance directives H. List of other physicians I.  Hospitalizations, surgeries, and ER visits in previous 12 months J.  [place] to include hearing, vision, cognitive, depression L. Referrals and appointments - none  In addition, I have reviewed and discussed with patient certain preventive protocols, quality metrics, and best practice recommendations. A written personalized care plan for preventive services as well as general preventive health recommendations were provided to patient.  See attached scanned questionnaire for additional information.   Signed,   Lindell Noe, MHA, BS, LPN Health Coach

## 2018-02-10 NOTE — Patient Instructions (Signed)
Nathaniel [name] , Thank you for taking time to come for your Medicare Wellness Visit. I appreciate your ongoing commitment to your health goals. Please review the following plan we discussed and let me know if I can assist you in the future.   These are the goals we discussed: Goals    . Increase physical activity     Starting 02/10/2018, I will continue to walk at least 3/4 mile daily.        This is a list of the screening recommended for you and due dates:  Health Maintenance  Topic Date Due  . Colon Cancer Screening  07/22/2018*  . Pneumonia vaccines (1 of 2 - PCV13) 02/10/2049*  . Flu Shot  02/20/2018  . Tetanus Vaccine  07/23/2020  .  Hepatitis C: One time screening is recommended by Center for Disease Control  (CDC) for  adults born from [birth year] through 1967.   Completed  *Topic was postponed. The date shown is not the original due date.   Preventive Care for Adults  A healthy lifestyle and preventive care can promote health and wellness. Preventive health guidelines for adults include the following key practices.  . A routine yearly physical is a good way to check with your health care provider about your health and preventive screening. It is a chance to share any concerns and updates on your health and to receive a thorough exam.  . Visit your dentist for a routine exam and preventive care every 6 months. Brush your teeth twice a day and floss once a day. Good oral hygiene prevents tooth decay and gum disease.  . The frequency of eye exams is based on your age, health, family medical history, use  of contact lenses, and other factors. Follow your health care provider's recommendations for frequency of eye exams.  . Eat a healthy diet. Foods like vegetables, fruits, whole grains, low-fat dairy products, and lean protein foods contain the nutrients you need without too many calories. Decrease your intake of foods high in solid fats, added sugars, and salt. Eat the right amount of  calories for you. Get information about a proper diet from your health care provider, if necessary.  . Regular physical exercise is one of the most important things you can do for your health. Most adults should get at least 150 minutes of moderate-intensity exercise (any activity that increases your heart rate and causes you to sweat) each week. In addition, most adults need muscle-strengthening exercises on 2 or more days a week.  Silver Sneakers may be a benefit available to you. To determine eligibility, you may visit the website: www.silversneakers.com or contact program at (343)749-8084 Mon-Fri between 8AM-8PM.   . Maintain a healthy weight. The body mass index (BMI) is a screening tool to identify possible weight problems. It provides an estimate of body fat based on height and weight. Your health care provider can find your BMI and can help you achieve or maintain a healthy weight.   For adults 20 years and older: ? A BMI below 18.5 is considered underweight. ? A BMI of 18.5 to 24.9 is normal. ? A BMI of 25 to 29.9 is considered overweight. ? A BMI of 30 and above is considered obese.   . Maintain normal blood lipids and cholesterol levels by exercising and minimizing your intake of saturated fat. Eat a balanced diet with plenty of fruit and vegetables. Blood tests for lipids and cholesterol should begin at age 20 and be repeated every 5  years. If your lipid or cholesterol levels are high, you are over 50, or you are at high risk for heart disease, you may need your cholesterol levels checked more frequently. Ongoing high lipid and cholesterol levels should be treated with medicines if diet and exercise are not working.  . If you smoke, find out from your health care provider how to quit. If you do not use tobacco, please do not start.  . If you choose to drink alcohol, please do not consume more than 2 drinks per day. One drink is considered to be 12 ounces (355 mL) of beer, 5 ounces  (148 mL) of wine, or 1.5 ounces (44 mL) of liquor.  . If you are 22-17 years old, ask your health care provider if you should take aspirin to prevent strokes.  . Use sunscreen. Apply sunscreen liberally and repeatedly throughout the day. You should seek shade when your shadow is shorter than you. Protect yourself by wearing long sleeves, pants, a wide-brimmed hat, and sunglasses year round, whenever you are outdoors.  . Once a month, do a whole body skin exam, using a mirror to look at the skin on your back. Tell your health care provider of new moles, moles that have irregular borders, moles that are larger than a pencil eraser, or moles that have changed in shape or color.

## 2018-02-10 NOTE — Progress Notes (Signed)
PCP notes:   Health maintenance:  Colon cancer screening - PCP please address at next appt PNA vaccines - pt declined Hep C screening - completed  Abnormal screenings:   Depression score: 3 Depression screen [facility] 2/9 02/10/2018 01/16/2016 01/17/2015  Decreased Interest 1 0 2  Down, Depressed, Hopeless 0 0 2  PHQ - 2 Score 1 0 4  Altered sleeping 0 - 0  Tired, decreased energy 1 - 3  Change in appetite 0 - 0  Feeling bad or failure about yourself  0 - 0  Trouble concentrating 0 - 0  Moving slowly or fidgety/restless 1 - 0  Suicidal thoughts 0 - 0  PHQ-9 Score 3 - 7  Difficult doing work/chores Not difficult at all - Not difficult at all    Patient concerns:   None  Nurse concerns:  None  Next PCP appt:   02/13/18 @ 1020

## 2018-02-11 DIAGNOSIS — M5136 Other intervertebral disc degeneration, lumbar region: Secondary | ICD-10-CM | POA: Diagnosis not present

## 2018-02-11 DIAGNOSIS — M5416 Radiculopathy, lumbar region: Secondary | ICD-10-CM | POA: Diagnosis not present

## 2018-02-11 DIAGNOSIS — M9905 Segmental and somatic dysfunction of pelvic region: Secondary | ICD-10-CM | POA: Diagnosis not present

## 2018-02-11 DIAGNOSIS — M9903 Segmental and somatic dysfunction of lumbar region: Secondary | ICD-10-CM | POA: Diagnosis not present

## 2018-02-11 LAB — HEPATITIS C ANTIBODY
HEP C AB: NONREACTIVE
SIGNAL TO CUT-OFF: 0.01 (ref <1.00)

## 2018-02-11 NOTE — Progress Notes (Signed)
I reviewed health advisor's note, was available for consultation, and agree with documentation and plan.  

## 2018-02-13 ENCOUNTER — Ambulatory Visit (INDEPENDENT_AMBULATORY_CARE_PROVIDER_SITE_OTHER): Payer: Medicare Other | Admitting: Primary Care

## 2018-02-13 ENCOUNTER — Encounter: Payer: Medicare Other | Admitting: Primary Care

## 2018-02-13 ENCOUNTER — Encounter: Payer: Self-pay | Admitting: Primary Care

## 2018-02-13 DIAGNOSIS — Z720 Tobacco use: Secondary | ICD-10-CM | POA: Diagnosis not present

## 2018-02-13 DIAGNOSIS — K4090 Unilateral inguinal hernia, without obstruction or gangrene, not specified as recurrent: Secondary | ICD-10-CM

## 2018-02-13 DIAGNOSIS — K219 Gastro-esophageal reflux disease without esophagitis: Secondary | ICD-10-CM

## 2018-02-13 DIAGNOSIS — R5383 Other fatigue: Secondary | ICD-10-CM | POA: Diagnosis not present

## 2018-02-13 DIAGNOSIS — E785 Hyperlipidemia, unspecified: Secondary | ICD-10-CM | POA: Diagnosis not present

## 2018-02-13 DIAGNOSIS — R7303 Prediabetes: Secondary | ICD-10-CM | POA: Diagnosis not present

## 2018-02-13 DIAGNOSIS — R251 Tremor, unspecified: Secondary | ICD-10-CM

## 2018-02-13 DIAGNOSIS — I70212 Atherosclerosis of native arteries of extremities with intermittent claudication, left leg: Secondary | ICD-10-CM | POA: Diagnosis not present

## 2018-02-13 NOTE — Assessment & Plan Note (Signed)
Slight improvement in LDL, cannot tolerate statins. Continue Plavix. Discussed to work on diet and activity level.

## 2018-02-13 NOTE — Assessment & Plan Note (Signed)
Compliant to Plavix, cannot tolerate statins.  Recent lipid panel with LDL of 158 which is a reduction from last year. Some improvement in diet, encouraged him to make additional changes.   Following with vascular surgery and due again this Fall.

## 2018-02-13 NOTE — Assessment & Plan Note (Signed)
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 No recent use.

## 2018-02-13 NOTE — Patient Instructions (Addendum)
Continue to work on [employer]. Limit fast food, sweets, sugary drinks.  Continue to remain active, make sure to exercise.  Schedule a lab only appointment in 6 months to repeat the blood sugar level.  Follow up in 1 year for your annual exam or sooner if needed.  It was a pleasure to see you today!

## 2018-02-13 NOTE — Assessment & Plan Note (Signed)
A1C continues in the prediabetic range, not much difference from last time.  Strongly advised he cut back on sweets, fast food, and sweet tea.  Repeat in 6 months.

## 2018-02-13 NOTE — Assessment & Plan Note (Signed)
Continues to smoke, discussed importance of quitting. Lung cancer screening negative. Repeat next year.

## 2018-02-13 NOTE — Assessment & Plan Note (Signed)
Continues to notice intermittently, thinks this has increased in size. Overall not bothersome and can reduce himself.   Offered ultrasound for evaluation for which he kindly declines. Discussed precautions and to notify if symptoms progress.

## 2018-02-13 NOTE — Assessment & Plan Note (Signed)
Much improved since increase in water intake. BMP unremarkable.

## 2018-02-13 NOTE — Assessment & Plan Note (Signed)
Overall stable, using Nexium infrequently. Mostly taking Zantac 1-2 times monthly.

## 2018-02-13 NOTE — Progress Notes (Signed)
Subjective:    Patient ID: Nathaniel [name], male    DOB: 1951/02/07, 67 y.o.   MRN: [id number]  HPI  Nathaniel [name] is a 67 year old male who presents today for [place] Part 2.  1) GERD: Currently managed on Nexium for which Nathaniel [name] takes infrequently. Mostly takes Zantac 1-2 times monthly before eating pasta.   2) Atherosclerosis/Hyperlipdiemia: Currently managed on Plavix 75 mg and endorses compliance. Following with vascular surgery and is due for repeat ABI's and lower extremity arterial duplex in November this year.   Diet currently consists of:  Breakfast: Toast with butter, bacon, pimento cheese sandwiches Lunch: Skips Dinner: Fast food/restaurants, chicken, soup, pasta, no vegetables Snacks: None Desserts: 3-4 times daily Beverages: Water, 6 cups of coffee per day, 1/2-1 gallon sweet tea per day  Exercise: Nathaniel [name] is active around his home.   3) Prediabetes: Recent A1C of 6.1. A1C of 6.0 in April 2019. Nathaniel [name] eats sweets daily, also drinks 1/2-1 gallon of sweet tea daily. Has increased his intake of water. Nathaniel [name] denies dizziness, numbness/tingling, urinary frequency.   Immunizations: -Tetanus: Declines -Pneumonia: Declines -Shingles: Declines  Colonoscopy: Completed in 2016 PSA: 1.03 in 2019, 0.99 in 2016 Hep C Screen: Negative in 2019   Review of Systems  Eyes: Negative for visual disturbance.  Respiratory: Negative for shortness of breath.   Cardiovascular: Negative for chest pain.  Gastrointestinal:       Infrequent GERD  Neurological: Negative for dizziness, weakness and headaches.       Past Medical History:  Diagnosis Date  . Cataract   . Chicken pox   . GERD (gastroesophageal reflux disease)   . Hyperlipidemia   . Pre-diabetes   . Seizures ([place])    None since age 80     Social History   Socioeconomic History  . Marital status: Married    Spouse name: Not on file  . Number of children: Not on file  . Years of education: Not on file  . Highest education  level: Not on file  Occupational History  . Not on file  Social Needs  . Financial resource strain: Not on file  . Food insecurity:    Worry: Not on file    Inability: Not on file  . Transportation needs:    Medical: Not on file    Non-medical: Not on file  Tobacco Use  . Smoking status: Current Every Day Smoker    Packs/day: 1.00    Years: 50.00    Pack years: 50.00    Types: Cigarettes  . Smokeless tobacco: Never Used  Substance and Sexual Activity  . Alcohol use: Yes    Alcohol/week: 0.0 oz    Comment: scotch twice a year. occassionally beer once every 2-3 mths  . Drug use: No  . Sexual activity: Not on file  Lifestyle  . Physical activity:    Days per week: Not on file    Minutes per session: Not on file  . Stress: Not on file  Relationships  . Social connections:    Talks on phone: Not on file    Gets together: Not on file    Attends religious service: Not on file    Active member of club or organization: Not on file    Attends meetings of clubs or organizations: Not on file    Relationship status: Not on file  . Intimate partner violence:    Fear of current or ex partner: Not on file    Emotionally abused:  Not on file    Physically abused: Not on file    Forced sexual activity: Not on file  Other Topics Concern  . Not on file  Social History Narrative   Retired.   Stays busy with household work, Surveyor, mining.   Married for 3 years.   Grown children.   Enjoys reading.     Past Surgical History:  Procedure Laterality Date  . BACK SURGERY    . LAMINECTOMY  1987  . LOWER EXTREMITY ANGIOGRAPHY Left 05/02/2017   Procedure: Lower Extremity Angiography;  Surgeon: Algernon Huxley, MD;  Location: [facility];  Service: Cardiovascular;  Laterality: Left;  . TONSILLECTOMY      Family History  Problem Relation Age of Onset  . Heart disease Mother        Arrthymia   . Stroke Father        Deceased  . Dementia Father   . Heart disease Brother         Myocardial infarction  . Colon cancer Neg Hx     Allergies  Allergen Reactions  . Prozac [Fluoxetine Hcl] Other (See Comments)    Extreme depression  . Statins     Current Outpatient Medications on File Prior to Visit  Medication Sig Dispense Refill  . clonazePAM (KLONOPIN) 0.5 MG tablet Take 1/2 tablet by mouth daily for three days as needed for tremors. 15 tablet 0  . clopidogrel (PLAVIX) 75 MG tablet TAKE 1 TABLET BY MOUTH EVERY DAY 30 tablet 6  . diphenhydrAMINE (BENADRYL) 25 MG tablet Take 25 mg by mouth every 6 (six) hours as needed for itching.    . esomeprazole (NEXIUM) 20 MG capsule Take 20 mg by mouth daily as needed (heartburn).     . Naproxen Sod-Diphenhydramine (ALEVE PM PO) Take by mouth as needed.      No current facility-administered medications on file prior to visit.     BP 124/70   Pulse 85   Temp 98.2 F (36.8 C) (Oral)   Wt 194 lb 12 oz (88.3 kg)   SpO2 97%   BMI 24.67 kg/m    Objective:   Physical Exam  Constitutional: Nathaniel [name] is oriented to person, place, and time. Nathaniel [name] appears well-nourished.  HENT:  Mouth/Throat: No oropharyngeal exudate.  Eyes: Pupils are equal, round, and reactive to light. EOM are normal.  Neck: Neck supple. No thyromegaly present.  Cardiovascular: Normal rate and regular rhythm.  Respiratory: Effort normal and breath sounds normal.  GI: Soft. Bowel sounds are normal. There is no tenderness.  Musculoskeletal: Normal range of motion.  Neurological: Nathaniel [name] is alert and oriented to person, place, and time.  Skin: Skin is warm and dry.  Psychiatric: Nathaniel [name] has a normal mood and affect.           Assessment & Plan:

## 2018-03-11 DIAGNOSIS — M5136 Other intervertebral disc degeneration, lumbar region: Secondary | ICD-10-CM | POA: Diagnosis not present

## 2018-03-11 DIAGNOSIS — M9903 Segmental and somatic dysfunction of lumbar region: Secondary | ICD-10-CM | POA: Diagnosis not present

## 2018-03-11 DIAGNOSIS — M9905 Segmental and somatic dysfunction of pelvic region: Secondary | ICD-10-CM | POA: Diagnosis not present

## 2018-03-11 DIAGNOSIS — M5416 Radiculopathy, lumbar region: Secondary | ICD-10-CM | POA: Diagnosis not present

## 2018-04-08 DIAGNOSIS — M5416 Radiculopathy, lumbar region: Secondary | ICD-10-CM | POA: Diagnosis not present

## 2018-04-08 DIAGNOSIS — M9905 Segmental and somatic dysfunction of pelvic region: Secondary | ICD-10-CM | POA: Diagnosis not present

## 2018-04-08 DIAGNOSIS — M5136 Other intervertebral disc degeneration, lumbar region: Secondary | ICD-10-CM | POA: Diagnosis not present

## 2018-04-08 DIAGNOSIS — M9903 Segmental and somatic dysfunction of lumbar region: Secondary | ICD-10-CM | POA: Diagnosis not present

## 2018-04-10 ENCOUNTER — Other Ambulatory Visit (INDEPENDENT_AMBULATORY_CARE_PROVIDER_SITE_OTHER): Payer: Self-pay | Admitting: Vascular Surgery

## 2018-04-14 DIAGNOSIS — Z08 Encounter for follow-up examination after completed treatment for malignant neoplasm: Secondary | ICD-10-CM | POA: Diagnosis not present

## 2018-04-14 DIAGNOSIS — Z85820 Personal history of malignant melanoma of skin: Secondary | ICD-10-CM | POA: Diagnosis not present

## 2018-04-14 DIAGNOSIS — D225 Melanocytic nevi of trunk: Secondary | ICD-10-CM | POA: Diagnosis not present

## 2018-04-14 DIAGNOSIS — Z85828 Personal history of other malignant neoplasm of skin: Secondary | ICD-10-CM | POA: Diagnosis not present

## 2018-04-14 DIAGNOSIS — L821 Other seborrheic keratosis: Secondary | ICD-10-CM | POA: Diagnosis not present

## 2018-04-14 DIAGNOSIS — L570 Actinic keratosis: Secondary | ICD-10-CM | POA: Diagnosis not present

## 2018-04-14 DIAGNOSIS — X32XXXA Exposure to sunlight, initial encounter: Secondary | ICD-10-CM | POA: Diagnosis not present

## 2018-04-25 ENCOUNTER — Other Ambulatory Visit (INDEPENDENT_AMBULATORY_CARE_PROVIDER_SITE_OTHER): Payer: Self-pay | Admitting: Vascular Surgery

## 2018-05-06 DIAGNOSIS — M9903 Segmental and somatic dysfunction of lumbar region: Secondary | ICD-10-CM | POA: Diagnosis not present

## 2018-05-06 DIAGNOSIS — M5136 Other intervertebral disc degeneration, lumbar region: Secondary | ICD-10-CM | POA: Diagnosis not present

## 2018-05-06 DIAGNOSIS — M9905 Segmental and somatic dysfunction of pelvic region: Secondary | ICD-10-CM | POA: Diagnosis not present

## 2018-05-06 DIAGNOSIS — M5416 Radiculopathy, lumbar region: Secondary | ICD-10-CM | POA: Diagnosis not present

## 2018-06-03 DIAGNOSIS — M5416 Radiculopathy, lumbar region: Secondary | ICD-10-CM | POA: Diagnosis not present

## 2018-06-03 DIAGNOSIS — M9905 Segmental and somatic dysfunction of pelvic region: Secondary | ICD-10-CM | POA: Diagnosis not present

## 2018-06-03 DIAGNOSIS — M5136 Other intervertebral disc degeneration, lumbar region: Secondary | ICD-10-CM | POA: Diagnosis not present

## 2018-06-03 DIAGNOSIS — M9903 Segmental and somatic dysfunction of lumbar region: Secondary | ICD-10-CM | POA: Diagnosis not present

## 2018-06-16 ENCOUNTER — Other Ambulatory Visit (INDEPENDENT_AMBULATORY_CARE_PROVIDER_SITE_OTHER): Payer: Self-pay | Admitting: Vascular Surgery

## 2018-06-16 DIAGNOSIS — I739 Peripheral vascular disease, unspecified: Secondary | ICD-10-CM

## 2018-06-17 ENCOUNTER — Other Ambulatory Visit (INDEPENDENT_AMBULATORY_CARE_PROVIDER_SITE_OTHER): Payer: Self-pay | Admitting: Vascular Surgery

## 2018-06-17 ENCOUNTER — Ambulatory Visit (INDEPENDENT_AMBULATORY_CARE_PROVIDER_SITE_OTHER): Payer: Medicare Other

## 2018-06-17 ENCOUNTER — Encounter (INDEPENDENT_AMBULATORY_CARE_PROVIDER_SITE_OTHER): Payer: Self-pay | Admitting: Vascular Surgery

## 2018-06-17 ENCOUNTER — Ambulatory Visit (INDEPENDENT_AMBULATORY_CARE_PROVIDER_SITE_OTHER): Payer: Medicare Other | Admitting: Vascular Surgery

## 2018-06-17 VITALS — BP 124/81 | HR 67 | Resp 16 | Ht 76.0 in | Wt 194.0 lb

## 2018-06-17 DIAGNOSIS — K219 Gastro-esophageal reflux disease without esophagitis: Secondary | ICD-10-CM

## 2018-06-17 DIAGNOSIS — I70212 Atherosclerosis of native arteries of extremities with intermittent claudication, left leg: Secondary | ICD-10-CM

## 2018-06-17 DIAGNOSIS — F17210 Nicotine dependence, cigarettes, uncomplicated: Secondary | ICD-10-CM

## 2018-06-17 DIAGNOSIS — Z95820 Peripheral vascular angioplasty status with implants and grafts: Secondary | ICD-10-CM | POA: Diagnosis not present

## 2018-06-17 DIAGNOSIS — I739 Peripheral vascular disease, unspecified: Secondary | ICD-10-CM | POA: Diagnosis not present

## 2018-06-17 DIAGNOSIS — E785 Hyperlipidemia, unspecified: Secondary | ICD-10-CM | POA: Diagnosis not present

## 2018-06-17 NOTE — Assessment & Plan Note (Signed)
Improved after intervention.  No current lifestyle limiting symptoms or limb threatening symptoms.  His ABIs today are 1.1 bilaterally with brisk triphasic waveforms.  His digital pressures are normal.  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  No other hemodynamically significant stenosis is identified. At this point, we can stretch up his follow-up to an annual basis.

## 2018-06-17 NOTE — Patient Instructions (Signed)

## 2018-06-17 NOTE — Progress Notes (Signed)
MRN : [id number]  Nathaniel [name] is a 67 y.o. (02-24-1951) male who presents with chief complaint of  Chief Complaint  Patient presents with  . Follow-up    6 month ABI and Arterial  .  History of Present Illness: Patient returns today in follow up of ADD.  His legs are doing well but he says in general, he is struggling with increased fatigue and poor stamina.  He underwent intervention last year which has improved his claudication symptoms significantly.  His ABIs today are 1.1 bilaterally with brisk triphasic waveforms.  His digital pressures are normal.  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  No other hemodynamically significant stenosis is identified.  Current Outpatient Medications  Medication Sig Dispense Refill  . clonazePAM (KLONOPIN) 0.5 MG tablet Take 1/2 tablet by mouth daily for three days as needed for tremors. 15 tablet 0  . PLAVIX 75 MG tablet TAKE 1 TABLET BY MOUTH EVERY DAY 90 tablet 3  . diphenhydrAMINE (BENADRYL) 25 MG tablet Take 25 mg by mouth every 6 (six) hours as needed for itching.    . esomeprazole (NEXIUM) 20 MG capsule Take 20 mg by mouth daily as needed (heartburn).     . Naproxen Sod-Diphenhydramine (ALEVE PM PO) Take by mouth as needed.      No current facility-administered medications for this visit.     Past Medical History:  Diagnosis Date  . Cataract   . Chicken pox   . GERD (gastroesophageal reflux disease)   . Hyperlipidemia   . Pre-diabetes   . Seizures ([place])    None since age 35    Past Surgical History:  Procedure Laterality Date  . BACK SURGERY    . LAMINECTOMY  1987  . LOWER EXTREMITY ANGIOGRAPHY Left 05/02/2017   Procedure: Lower Extremity Angiography;  Surgeon: Algernon Huxley, MD;  Location: [facility];  Service: Cardiovascular;  Laterality: Left;  . TONSILLECTOMY     Social History        Tobacco Use  . Smoking status: Current Every Day Smoker    Packs/day: 1.00      Years: 50.00    Pack years: 50.00    Types: Cigarettes  . Smokeless tobacco: Never Used  Substance Use Topics  . Alcohol use: Yes    Alcohol/week: 0.0 oz    Comment: scotch twice a year.  . Drug use: No    Family History      Family History  Problem Relation Age of Onset  . Heart disease Mother        Arrthymia   . Stroke Father        Deceased  . Dementia Father   . Heart disease Brother        Myocardial infarction  . Colon cancer Neg Hx          Allergies  Allergen Reactions  . Prozac [Fluoxetine Hcl] Other (See Comments)    Extreme depression  . Statins     REVIEW OF SYSTEMS(Negative unless checked)  Constitutional: [] Weight loss[] Fever[] Chills Cardiac:[] Chest pain[] Chest pressure[] Palpitations [] Shortness of breath when laying flat [] Shortness of breath at rest [] Shortness of breath with exertion. Vascular: [x] Pain in legs with walking[] Pain in legsat rest[] Pain in legs when laying flat [x] Claudication [] Pain in feet when walking [] Pain in feet at rest [] Pain in feet when laying flat [] History of DVT [] Phlebitis [] Swelling in legs [] Varicose veins [] Non-healing ulcers Pulmonary: [] Uses home oxygen [] Productive cough[] Hemoptysis [] Wheeze [] COPD []   Asthma Neurologic: [x] Dizziness [] Blackouts [x] Seizures [] History of stroke [] History of TIA[] Aphasia [] Temporary blindness[] Dysphagia [] Weaknessor numbness in arms [] Weakness or numbnessin legs Musculoskeletal: [x] Arthritis [] Joint swelling [] Joint pain [] Low back pain Hematologic:[] Easy bruising[] Easy bleeding [] Hypercoagulable state [] Anemic [] Hepatitis Gastrointestinal:[] Blood in stool[] Vomiting blood[x] Gastroesophageal reflux/heartburn[] Abdominal pain Genitourinary: [] Chronic kidney disease [] Difficulturination [] Frequenturination [] Burning with urination[] Hematuria Skin: [] Rashes  [] Ulcers [] Wounds Psychological: [] History of anxiety[] History of major depression.     Physical Examination  BP 124/81 (BP Location: Right Arm, Patient Position: Sitting)   Pulse 67   Resp 16   Ht 6\' 4"  (1.93 m)   Wt 194 lb (88 kg)   BMI 23.61 kg/m  Gen:  WD/WN, NAD Head: [place]/AT, No temporalis wasting. Ear/Nose/Throat: Hearing grossly intact, nares w/o erythema or drainage Eyes: Conjunctiva clear. Sclera non-icteric Neck: Supple.  Trachea midline Pulmonary:  Good air movement, no use of accessory muscles.  Cardiac: RRR, no JVD Vascular:  Vessel Right Left  Radial Palpable Palpable                          PT Palpable Palpable  DP Palpable Palpable   Gastrointestinal: soft, non-tender/non-distended. No guarding/reflex.  Musculoskeletal: M/S 5/5 throughout.  No deformity or atrophy. No edema. Neurologic: Sensation grossly intact in extremities.  Symmetrical.  Speech is fluent.  Psychiatric: Judgment intact, Mood & affect appropriate for pt's clinical situation. Dermatologic: No rashes or ulcers noted.  No cellulitis or open wounds.       Labs No results found for this or any previous visit (from the past 2160 hour(s)).  Radiology No results found.  Assessment/Plan Hyperlipidemia lipid control important in reducing the progression of atherosclerotic disease.Intolerant to statins.     GERD (gastroesophageal reflux disease) Continue antihypertensive medications as already ordered, these medications have been reviewed and there are no changes at this time.  Avoidence of caffeine and alcohol  Moderate elevation of the head of the bed   Atherosclerosis of native arteries of extremity with intermittent claudication (HCC) Improved after intervention.  No current lifestyle limiting symptoms or limb threatening symptoms.  His ABIs today are 1.1 bilaterally with brisk triphasic waveforms.  His digital pressures are normal.  His duplex shows only  some deep femoral stenosis which is moderate on the right.  No other hemodynamically significant stenosis is identified. At this point, we can stretch up his follow-up to an annual basis.    Leotis Pain, MD  06/17/2018 11:57 AM    This note was created with Dragon medical transcription system.  Any errors from dictation are purely unintentional

## 2018-07-01 DIAGNOSIS — M5416 Radiculopathy, lumbar region: Secondary | ICD-10-CM | POA: Diagnosis not present

## 2018-07-01 DIAGNOSIS — M9905 Segmental and somatic dysfunction of pelvic region: Secondary | ICD-10-CM | POA: Diagnosis not present

## 2018-07-01 DIAGNOSIS — M9903 Segmental and somatic dysfunction of lumbar region: Secondary | ICD-10-CM | POA: Diagnosis not present

## 2018-07-01 DIAGNOSIS — M5136 Other intervertebral disc degeneration, lumbar region: Secondary | ICD-10-CM | POA: Diagnosis not present

## 2018-07-29 DIAGNOSIS — M9905 Segmental and somatic dysfunction of pelvic region: Secondary | ICD-10-CM | POA: Diagnosis not present

## 2018-07-29 DIAGNOSIS — M5136 Other intervertebral disc degeneration, lumbar region: Secondary | ICD-10-CM | POA: Diagnosis not present

## 2018-07-29 DIAGNOSIS — M9903 Segmental and somatic dysfunction of lumbar region: Secondary | ICD-10-CM | POA: Diagnosis not present

## 2018-07-29 DIAGNOSIS — M5416 Radiculopathy, lumbar region: Secondary | ICD-10-CM | POA: Diagnosis not present

## 2018-08-26 DIAGNOSIS — M9903 Segmental and somatic dysfunction of lumbar region: Secondary | ICD-10-CM | POA: Diagnosis not present

## 2018-08-26 DIAGNOSIS — M5416 Radiculopathy, lumbar region: Secondary | ICD-10-CM | POA: Diagnosis not present

## 2018-08-26 DIAGNOSIS — M5136 Other intervertebral disc degeneration, lumbar region: Secondary | ICD-10-CM | POA: Diagnosis not present

## 2018-08-26 DIAGNOSIS — M9905 Segmental and somatic dysfunction of pelvic region: Secondary | ICD-10-CM | POA: Diagnosis not present

## 2018-09-15 DIAGNOSIS — X32XXXA Exposure to sunlight, initial encounter: Secondary | ICD-10-CM | POA: Diagnosis not present

## 2018-09-15 DIAGNOSIS — L820 Inflamed seborrheic keratosis: Secondary | ICD-10-CM | POA: Diagnosis not present

## 2018-09-15 DIAGNOSIS — L570 Actinic keratosis: Secondary | ICD-10-CM | POA: Diagnosis not present

## 2018-09-15 DIAGNOSIS — D2272 Melanocytic nevi of left lower limb, including hip: Secondary | ICD-10-CM | POA: Diagnosis not present

## 2018-09-15 DIAGNOSIS — Z08 Encounter for follow-up examination after completed treatment for malignant neoplasm: Secondary | ICD-10-CM | POA: Diagnosis not present

## 2018-09-15 DIAGNOSIS — Z85828 Personal history of other malignant neoplasm of skin: Secondary | ICD-10-CM | POA: Diagnosis not present

## 2018-09-15 DIAGNOSIS — L538 Other specified erythematous conditions: Secondary | ICD-10-CM | POA: Diagnosis not present

## 2018-09-15 DIAGNOSIS — D2271 Melanocytic nevi of right lower limb, including hip: Secondary | ICD-10-CM | POA: Diagnosis not present

## 2018-09-15 DIAGNOSIS — Z85820 Personal history of malignant melanoma of skin: Secondary | ICD-10-CM | POA: Diagnosis not present

## 2018-09-15 DIAGNOSIS — D2261 Melanocytic nevi of right upper limb, including shoulder: Secondary | ICD-10-CM | POA: Diagnosis not present

## 2018-09-15 DIAGNOSIS — R208 Other disturbances of skin sensation: Secondary | ICD-10-CM | POA: Diagnosis not present

## 2018-09-23 DIAGNOSIS — M9905 Segmental and somatic dysfunction of pelvic region: Secondary | ICD-10-CM | POA: Diagnosis not present

## 2018-09-23 DIAGNOSIS — M5416 Radiculopathy, lumbar region: Secondary | ICD-10-CM | POA: Diagnosis not present

## 2018-09-23 DIAGNOSIS — M9903 Segmental and somatic dysfunction of lumbar region: Secondary | ICD-10-CM | POA: Diagnosis not present

## 2018-09-23 DIAGNOSIS — M5136 Other intervertebral disc degeneration, lumbar region: Secondary | ICD-10-CM | POA: Diagnosis not present

## 2018-10-21 DIAGNOSIS — M5136 Other intervertebral disc degeneration, lumbar region: Secondary | ICD-10-CM | POA: Diagnosis not present

## 2018-10-21 DIAGNOSIS — M5416 Radiculopathy, lumbar region: Secondary | ICD-10-CM | POA: Diagnosis not present

## 2018-10-21 DIAGNOSIS — M9903 Segmental and somatic dysfunction of lumbar region: Secondary | ICD-10-CM | POA: Diagnosis not present

## 2018-10-21 DIAGNOSIS — M9905 Segmental and somatic dysfunction of pelvic region: Secondary | ICD-10-CM | POA: Diagnosis not present

## 2018-11-18 DIAGNOSIS — M5136 Other intervertebral disc degeneration, lumbar region: Secondary | ICD-10-CM | POA: Diagnosis not present

## 2018-11-18 DIAGNOSIS — M9905 Segmental and somatic dysfunction of pelvic region: Secondary | ICD-10-CM | POA: Diagnosis not present

## 2018-11-18 DIAGNOSIS — M9903 Segmental and somatic dysfunction of lumbar region: Secondary | ICD-10-CM | POA: Diagnosis not present

## 2018-11-18 DIAGNOSIS — M5416 Radiculopathy, lumbar region: Secondary | ICD-10-CM | POA: Diagnosis not present

## 2018-12-16 DIAGNOSIS — M9903 Segmental and somatic dysfunction of lumbar region: Secondary | ICD-10-CM | POA: Diagnosis not present

## 2018-12-16 DIAGNOSIS — M5136 Other intervertebral disc degeneration, lumbar region: Secondary | ICD-10-CM | POA: Diagnosis not present

## 2018-12-16 DIAGNOSIS — M5416 Radiculopathy, lumbar region: Secondary | ICD-10-CM | POA: Diagnosis not present

## 2018-12-16 DIAGNOSIS — M9905 Segmental and somatic dysfunction of pelvic region: Secondary | ICD-10-CM | POA: Diagnosis not present

## 2018-12-19 ENCOUNTER — Other Ambulatory Visit (INDEPENDENT_AMBULATORY_CARE_PROVIDER_SITE_OTHER): Payer: Self-pay | Admitting: Vascular Surgery

## 2019-01-13 DIAGNOSIS — M9905 Segmental and somatic dysfunction of pelvic region: Secondary | ICD-10-CM | POA: Diagnosis not present

## 2019-01-13 DIAGNOSIS — M5136 Other intervertebral disc degeneration, lumbar region: Secondary | ICD-10-CM | POA: Diagnosis not present

## 2019-01-13 DIAGNOSIS — M5416 Radiculopathy, lumbar region: Secondary | ICD-10-CM | POA: Diagnosis not present

## 2019-01-13 DIAGNOSIS — M9903 Segmental and somatic dysfunction of lumbar region: Secondary | ICD-10-CM | POA: Diagnosis not present

## 2019-01-20 DIAGNOSIS — D2271 Melanocytic nevi of right lower limb, including hip: Secondary | ICD-10-CM | POA: Diagnosis not present

## 2019-01-20 DIAGNOSIS — D2262 Melanocytic nevi of left upper limb, including shoulder: Secondary | ICD-10-CM | POA: Diagnosis not present

## 2019-01-20 DIAGNOSIS — D485 Neoplasm of uncertain behavior of skin: Secondary | ICD-10-CM | POA: Diagnosis not present

## 2019-01-20 DIAGNOSIS — Z85828 Personal history of other malignant neoplasm of skin: Secondary | ICD-10-CM | POA: Diagnosis not present

## 2019-01-20 DIAGNOSIS — Z08 Encounter for follow-up examination after completed treatment for malignant neoplasm: Secondary | ICD-10-CM | POA: Diagnosis not present

## 2019-01-20 DIAGNOSIS — D2261 Melanocytic nevi of right upper limb, including shoulder: Secondary | ICD-10-CM | POA: Diagnosis not present

## 2019-01-20 DIAGNOSIS — Z85820 Personal history of malignant melanoma of skin: Secondary | ICD-10-CM | POA: Diagnosis not present

## 2019-01-20 DIAGNOSIS — L821 Other seborrheic keratosis: Secondary | ICD-10-CM | POA: Diagnosis not present

## 2019-01-20 DIAGNOSIS — L820 Inflamed seborrheic keratosis: Secondary | ICD-10-CM | POA: Diagnosis not present

## 2019-01-30 ENCOUNTER — Ambulatory Visit
Admission: RE | Admit: 2019-01-30 | Discharge: 2019-01-30 | Disposition: A | Discharge status: Home / Self Care | Payer: Medicare Other | Source: Ambulatory Visit | Attending: Acute Care | Admitting: Acute Care

## 2019-01-30 ENCOUNTER — Other Ambulatory Visit: Payer: Self-pay

## 2019-01-30 DIAGNOSIS — Z122 Encounter for screening for malignant neoplasm of respiratory organs: Secondary | ICD-10-CM | POA: Diagnosis not present

## 2019-01-30 DIAGNOSIS — F17210 Nicotine dependence, cigarettes, uncomplicated: Secondary | ICD-10-CM | POA: Insufficient documentation

## 2019-02-02 ENCOUNTER — Telehealth: Payer: Self-pay | Admitting: Acute Care

## 2019-02-02 DIAGNOSIS — Z122 Encounter for screening for malignant neoplasm of respiratory organs: Secondary | ICD-10-CM

## 2019-02-02 DIAGNOSIS — F17210 Nicotine dependence, cigarettes, uncomplicated: Secondary | ICD-10-CM

## 2019-02-02 DIAGNOSIS — Z87891 Personal history of nicotine dependence: Secondary | ICD-10-CM

## 2019-02-02 NOTE — Telephone Encounter (Signed)
Pt informed of CT results per Sarah Groce, NP.  PT verbalized understanding.  Copy sent to PCP.  Order placed for 1 yr f/u CT.  

## 2019-02-10 DIAGNOSIS — M5416 Radiculopathy, lumbar region: Secondary | ICD-10-CM | POA: Diagnosis not present

## 2019-02-10 DIAGNOSIS — M9903 Segmental and somatic dysfunction of lumbar region: Secondary | ICD-10-CM | POA: Diagnosis not present

## 2019-02-10 DIAGNOSIS — M9905 Segmental and somatic dysfunction of pelvic region: Secondary | ICD-10-CM | POA: Diagnosis not present

## 2019-02-10 DIAGNOSIS — M5136 Other intervertebral disc degeneration, lumbar region: Secondary | ICD-10-CM | POA: Diagnosis not present

## 2019-02-13 ENCOUNTER — Other Ambulatory Visit: Payer: Self-pay | Admitting: Primary Care

## 2019-02-13 DIAGNOSIS — R7303 Prediabetes: Secondary | ICD-10-CM

## 2019-02-13 DIAGNOSIS — E785 Hyperlipidemia, unspecified: Secondary | ICD-10-CM

## 2019-02-18 ENCOUNTER — Other Ambulatory Visit (INDEPENDENT_AMBULATORY_CARE_PROVIDER_SITE_OTHER): Payer: Medicare Other

## 2019-02-18 ENCOUNTER — Ambulatory Visit: Payer: Medicare Other

## 2019-02-18 DIAGNOSIS — R7303 Prediabetes: Secondary | ICD-10-CM | POA: Diagnosis not present

## 2019-02-18 DIAGNOSIS — E785 Hyperlipidemia, unspecified: Secondary | ICD-10-CM

## 2019-02-18 LAB — COMPREHENSIVE METABOLIC PANEL
ALT: 14 U/L (ref 0–53)
AST: 15 U/L (ref 0–37)
Albumin: 4.2 g/dL (ref 3.5–5.2)
Alkaline Phosphatase: 96 U/L (ref 39–117)
BUN: 15 mg/dL (ref 6–23)
CO2: 28 mEq/L (ref 19–32)
Calcium: 9.5 mg/dL (ref 8.4–10.5)
Chloride: 103 mEq/L (ref 96–112)
Creatinine, Ser: 1.08 mg/dL (ref 0.40–1.50)
GFR: 67.58 mL/min (ref >60.00)
Glucose, Bld: 91 mg/dL (ref 70–99)
Potassium: 4.2 mEq/L (ref 3.5–5.1)
Sodium: 140 mEq/L (ref 135–145)
Total Bilirubin: 0.6 mg/dL (ref 0.2–1.2)
Total Protein: 6.7 g/dL (ref 6.0–8.3)

## 2019-02-18 LAB — LDL CHOLESTEROL, DIRECT: Direct LDL: 160 mg/dL

## 2019-02-18 LAB — LIPID PANEL
Cholesterol: 244 mg/dL — ABNORMAL HIGH (ref 0–200)
HDL: 41.8 mg/dL (ref >39.00)
NonHDL: 201.88
Total CHOL/HDL Ratio: 6
Triglycerides: 224 mg/dL — ABNORMAL HIGH (ref 0.0–149.0)
VLDL: 44.8 mg/dL — ABNORMAL HIGH (ref 0.0–40.0)

## 2019-02-18 LAB — HEMOGLOBIN A1C: Hgb A1c MFr Bld: 6 % (ref 4.6–6.5)

## 2019-02-20 ENCOUNTER — Other Ambulatory Visit: Payer: Self-pay

## 2019-02-20 ENCOUNTER — Ambulatory Visit (INDEPENDENT_AMBULATORY_CARE_PROVIDER_SITE_OTHER): Payer: Medicare Other | Admitting: Primary Care

## 2019-02-20 ENCOUNTER — Encounter: Payer: Self-pay | Admitting: Primary Care

## 2019-02-20 VITALS — BP 124/80 | HR 68 | Temp 97.8°F | Ht 76.0 in | Wt 205.5 lb

## 2019-02-20 DIAGNOSIS — I70212 Atherosclerosis of native arteries of extremities with intermittent claudication, left leg: Secondary | ICD-10-CM | POA: Diagnosis not present

## 2019-02-20 DIAGNOSIS — Z720 Tobacco use: Secondary | ICD-10-CM | POA: Diagnosis not present

## 2019-02-20 DIAGNOSIS — E785 Hyperlipidemia, unspecified: Secondary | ICD-10-CM

## 2019-02-20 DIAGNOSIS — K219 Gastro-esophageal reflux disease without esophagitis: Secondary | ICD-10-CM

## 2019-02-20 DIAGNOSIS — R7303 Prediabetes: Secondary | ICD-10-CM | POA: Diagnosis not present

## 2019-02-20 DIAGNOSIS — Z0000 Encounter for general adult medical examination without abnormal findings: Secondary | ICD-10-CM

## 2019-02-20 NOTE — Assessment & Plan Note (Signed)
Not wanting to quit.  Lung cancer screening up-to-date and reviewed.

## 2019-02-20 NOTE — Patient Instructions (Signed)
Continue exercising. You should be getting 150 minutes of exercise weekly.  Reduce sweet tea, this is likely contributing to your higher blood sugars.  Eat plenty of vegetables, fruit, whole grains, lean protein.  Ensure you are consuming 64 ounces of water daily.  I recommend vaccinations such as pneumonia, shingles, and influenza.  Follow up with the eye doctor and Dr. Lucky Cowboy.  It was a pleasure to see you today!

## 2019-02-20 NOTE — Assessment & Plan Note (Signed)
Infrequent symptoms, using Nexium as needed. Continue to avoid triggers.

## 2019-02-20 NOTE — Progress Notes (Signed)
Patient ID: Nathaniel [name], male   DOB: 09/04/1950, 68 y.o.   MRN: [id number]  Mr. Krotzer is a 68 year old male who presents today for [place].  HPI:  Past Medical History:  Diagnosis Date  . Cataract   . Chicken pox   . GERD (gastroesophageal reflux disease)   . Hyperlipidemia   . Pre-diabetes   . Seizures ([place])    None since age 56    Current Outpatient Medications  Medication Sig Dispense Refill  . clonazePAM (KLONOPIN) 0.5 MG tablet Take 1/2 tablet by mouth daily for three days as needed for tremors. 15 tablet 0  . clopidogrel (PLAVIX) 75 MG tablet TAKE 1 TABLET BY MOUTH EVERY DAY 90 tablet 2  . diphenhydrAMINE (BENADRYL) 25 MG tablet Take 25 mg by mouth every 6 (six) hours as needed for itching.    . esomeprazole (NEXIUM) 20 MG capsule Take 20 mg by mouth daily as needed (heartburn).     . Naproxen Sod-Diphenhydramine (ALEVE PM PO) Take by mouth as needed.      No current facility-administered medications for this visit.     Allergies  Allergen Reactions  . Prozac [Fluoxetine Hcl] Other (See Comments)    Extreme depression  . Statins     Family History  Problem Relation Age of Onset  . Heart disease Mother        Arrthymia   . Stroke Father        Deceased  . Dementia Father   . Heart disease Brother        Myocardial infarction  . Colon cancer Neg Hx     Social History   Socioeconomic History  . Marital status: Married    Spouse name: Not on file  . Number of children: Not on file  . Years of education: Not on file  . Highest education level: Not on file  Occupational History  . Not on file  Social Needs  . Financial resource strain: Not on file  . Food insecurity    Worry: Not on file    Inability: Not on file  . Transportation needs    Medical: Not on file    Non-medical: Not on file  Tobacco Use  . Smoking status: Current Every Day Smoker    Packs/day: 1.00    Years: 50.00    Pack years: 50.00    Types: Cigarettes  . Smokeless tobacco:  Never Used  Substance and Sexual Activity  . Alcohol use: Yes    Alcohol/week: 0.0 standard drinks    Comment: scotch twice a year. occassionally beer once every 2-3 mths  . Drug use: No  . Sexual activity: Not on file  Lifestyle  . Physical activity    Days per week: Not on file    Minutes per session: Not on file  . Stress: Not on file  Relationships  . Social Herbalist on phone: Not on file    Gets together: Not on file    Attends religious service: Not on file    Active member of club or organization: Not on file    Attends meetings of clubs or organizations: Not on file    Relationship status: Not on file  . Intimate partner violence    Fear of current or ex partner: Not on file    Emotionally abused: Not on file    Physically abused: Not on file    Forced sexual activity: Not on file  Other Topics  Concern  . Not on file  Social History Narrative   Retired.   Stays busy with household work, Surveyor, mining.   Married for 3 years.   Grown children.   Enjoys reading.     Hospitiliaztions: None  Health Maintenance:    Flu: Declines  Tetanus: Declines  Pneumovax: Declines  Prevnar: Declines  Shingles: Declines  Colonoscopy: Completed in 2016  Eye Doctor: Completes every 2-3 years  Dental Exam: Completes annually  PSA: 1.03 in 2019  Hep C Screening: Negative in 2019  Lung Cancer Screening: Completes annually     Providers: Alma Friendly, PCP; Dr Lucky Cowboy, Vascular Surgery, Dr. Evorn Gong, Dermatology    I have personally reviewed and have noted: 1. The patient's medical and social history 2. Their use of alcohol, tobacco or illicit drugs 3. Their current medications and supplements 4. The patient's functional ability including ADL's, fall risks, home safety risks and  hearing or visual impairment. 5. Diet and physical activities 6. Evidence for depression or mood disorder  Subjective:   Review of Systems:   Constitutional: Denies fever, malaise,  fatigue, headache or abrupt weight changes.  HEENT: Denies eye pain, eye redness, ear pain, ringing in the ears, wax buildup, runny nose, nasal congestion, bloody nose, or sore throat. Respiratory: Denies difficulty breathing, shortness of breath, cough or sputum production.   Cardiovascular: Denies chest pain, chest tightness, palpitations or swelling in the hands or feet.  Gastrointestinal: Denies abdominal pain, bloating, constipation, diarrhea or blood in the stool.  GU: Denies urgency, frequency, pain with urination, burning sensation, blood in urine, odor or discharge. Musculoskeletal: Denies decrease in range of motion, difficulty with gait. He does have joint stiffness in the morning at times, occasionally takes Aleve Skin: Denies redness, rashes. Following with dermatology. Neurological: Denies dizziness, difficulty with memory, difficulty with speech or problems with balance and coordination.  Psychiatric: Denies concerns for anxiety or depression.   No other specific complaints in a complete review of systems (except as listed in HPI above).  Objective:  PE:   BP 124/80   Pulse 68   Temp 97.8 F (36.6 C) (Temporal)   Ht 6\' 4"  (1.93 m)   Wt 205 lb 8 oz (93.2 kg)   SpO2 97%   BMI 25.01 kg/m  Wt Readings from Last 3 Encounters:  02/20/19 205 lb 8 oz (93.2 kg)  01/30/19 194 lb (88 kg)  06/17/18 194 lb (88 kg)    General: Appears their stated age, well developed, well nourished in NAD. Skin: Warm, dry and intact. No rashes, lesions or ulcerations noted. HEENT: Head: normal shape and size; Eyes: sclera white, no icterus, conjunctiva pink, PERRLA and EOMs intact; Nose: mucosa pink and moist, septum midline Neck: Normal range of motion. Neck supple, trachea midline. No massses, lumps or thyromegaly present.  Cardiovascular: Normal rate and rhythm. S1,S2 noted.  No murmur, rubs or gallops noted. No JVD or BLE edema. No carotid bruits noted. Pulmonary/Chest: Normal effort and  positive vesicular breath sounds. No respiratory distress. No wheezes, rales or ronchi noted.  Abdomen: Soft and nontender. Normal bowel sounds, no bruits noted. No distention or masses noted. Liver, spleen and kidneys non palpable. Musculoskeletal: Normal range of motion. No signs of joint swelling. No difficulty with gait.  Neurological: Alert and oriented. Cranial nerves II-XII intact. Coordination normal. +DTRs bilaterally. Psychiatric: Mood and affect normal. Behavior is normal. Judgment and thought content normal.    BMET    Component Value Date/Time   NA 140 02/18/2019  0940   K 4.2 02/18/2019 0940   CL 103 02/18/2019 0940   CO2 28 02/18/2019 0940   GLUCOSE 91 02/18/2019 0940   BUN 15 02/18/2019 0940   CREATININE 1.08 02/18/2019 0940   CALCIUM 9.5 02/18/2019 0940   GFRNONAA >60 04/30/2017 1240   GFRAA >60 04/30/2017 1240    Lipid Panel     Component Value Date/Time   CHOL 244 (H) 02/18/2019 0940   TRIG 224.0 (H) 02/18/2019 0940   HDL 41.80 02/18/2019 0940   CHOLHDL 6 02/18/2019 0940   VLDL 44.8 (H) 02/18/2019 0940   LDLCALC 158 (H) 02/10/2018 0845    CBC    Component Value Date/Time   WBC 9.1 11/19/2017 1300   RBC 4.66 11/19/2017 1300   HGB 14.6 11/19/2017 1300   HCT 42.9 11/19/2017 1300   PLT 234.0 11/19/2017 1300   MCV 91.9 11/19/2017 1300   MCH 30.7 11/23/2016 1438   MCHC 34.0 11/19/2017 1300   RDW 13.9 11/19/2017 1300   LYMPHSABS 2.3 11/14/2017 1035   MONOABS 0.8 11/14/2017 1035   EOSABS 0.1 11/14/2017 1035   BASOSABS 0.1 11/14/2017 1035    Hgb A1C Lab Results  Component Value Date   HGBA1C 6.0 02/18/2019      Assessment and Plan:   Medicare Annual Wellness Visit:  Diet: He endorses a poor diet. Eats pizza, eggs, bacon, lean protein, vegetables, starch. No fast food. Home cooked meals.  Beverages: Coffee, 1 gallon sweet tea, 4-6 bottles of water daily. Physical activity: Active Depression/mood screen: Negative Hearing: Intact to whispered  voice Visual acuity: Grossly normal, performs annual eye exam  ADLs: Capable Fall risk: None Home safety: Good Cognitive evaluation: Intact to orientation, naming, recall and repetition EOL planning: Adv directives, full code/ I agree  Preventative Medicine: Declines all vaccinations despite recommendations.  Compliant to annual lung cancer screening.  PSA up-to-date.  Colonoscopy up-to-date.  Recommended to complete advanced directives.  Exam stable.  Labs reviewed.  All recommendations provided at end of visit.  Next appointment: 1 year.

## 2019-02-20 NOTE — Assessment & Plan Note (Addendum)
Following with vascular surgery annually. Continues to decline statin therapy despite LDL of 160 on recent labs. Compliant to clopidogrel, continue same. Due for repeat ABIs and venous studies later this fall.

## 2019-02-20 NOTE — Assessment & Plan Note (Addendum)
Chronic, stable from last year without change. Encouraged a healthy diet, limit sweet tea. Continue to monitor annually.

## 2019-02-20 NOTE — Assessment & Plan Note (Signed)
Declines all vaccinations despite recommendations.  Compliant to annual lung cancer screening.  PSA up-to-date.  Colonoscopy up-to-date.  Recommended to complete advanced directives.  Exam stable.  Labs reviewed.  All recommendations provided at end of visit.  I have personally reviewed and have noted: 1. The patient's medical and social history 2. Their use of alcohol, tobacco or illicit drugs 3. Their current medications and supplements 4. The patient's functional ability including ADL's, fall risks, home safety risks and  hearing or visual impairment. 5. Diet and physical activities 6. Evidence for depression or mood disorder

## 2019-02-20 NOTE — Assessment & Plan Note (Signed)
Recent LDL above goal at 160. Continues to decline statin therapy despite recommendations. Compliant to clopidogrel.  The 10-year ASCVD risk score Mikey Bussing DC Brooke Bonito., et al., 2013) is: 24.6%   Values used to calculate the score:     Age: 68 years     Sex: Male     Is Non-Hispanic African American: No     Diabetic: No     Tobacco smoker: Yes     Systolic Blood Pressure: 297 mmHg     Is BP treated: No     HDL Cholesterol: 41.8 mg/dL     Total Cholesterol: 244 mg/dL

## 2019-03-10 DIAGNOSIS — M9905 Segmental and somatic dysfunction of pelvic region: Secondary | ICD-10-CM | POA: Diagnosis not present

## 2019-03-10 DIAGNOSIS — M5416 Radiculopathy, lumbar region: Secondary | ICD-10-CM | POA: Diagnosis not present

## 2019-03-10 DIAGNOSIS — M9903 Segmental and somatic dysfunction of lumbar region: Secondary | ICD-10-CM | POA: Diagnosis not present

## 2019-03-10 DIAGNOSIS — M5136 Other intervertebral disc degeneration, lumbar region: Secondary | ICD-10-CM | POA: Diagnosis not present

## 2019-04-07 DIAGNOSIS — M5416 Radiculopathy, lumbar region: Secondary | ICD-10-CM | POA: Diagnosis not present

## 2019-04-07 DIAGNOSIS — M9903 Segmental and somatic dysfunction of lumbar region: Secondary | ICD-10-CM | POA: Diagnosis not present

## 2019-04-07 DIAGNOSIS — M5136 Other intervertebral disc degeneration, lumbar region: Secondary | ICD-10-CM | POA: Diagnosis not present

## 2019-04-07 DIAGNOSIS — M9905 Segmental and somatic dysfunction of pelvic region: Secondary | ICD-10-CM | POA: Diagnosis not present

## 2019-05-05 DIAGNOSIS — M5136 Other intervertebral disc degeneration, lumbar region: Secondary | ICD-10-CM | POA: Diagnosis not present

## 2019-05-05 DIAGNOSIS — M9905 Segmental and somatic dysfunction of pelvic region: Secondary | ICD-10-CM | POA: Diagnosis not present

## 2019-05-05 DIAGNOSIS — M9903 Segmental and somatic dysfunction of lumbar region: Secondary | ICD-10-CM | POA: Diagnosis not present

## 2019-05-05 DIAGNOSIS — M5416 Radiculopathy, lumbar region: Secondary | ICD-10-CM | POA: Diagnosis not present

## 2019-06-02 DIAGNOSIS — M9903 Segmental and somatic dysfunction of lumbar region: Secondary | ICD-10-CM | POA: Diagnosis not present

## 2019-06-02 DIAGNOSIS — M5136 Other intervertebral disc degeneration, lumbar region: Secondary | ICD-10-CM | POA: Diagnosis not present

## 2019-06-02 DIAGNOSIS — M5416 Radiculopathy, lumbar region: Secondary | ICD-10-CM | POA: Diagnosis not present

## 2019-06-02 DIAGNOSIS — M9905 Segmental and somatic dysfunction of pelvic region: Secondary | ICD-10-CM | POA: Diagnosis not present

## 2019-06-16 ENCOUNTER — Other Ambulatory Visit (INDEPENDENT_AMBULATORY_CARE_PROVIDER_SITE_OTHER): Payer: Self-pay | Admitting: Vascular Surgery

## 2019-06-16 DIAGNOSIS — I739 Peripheral vascular disease, unspecified: Secondary | ICD-10-CM

## 2019-06-17 ENCOUNTER — Other Ambulatory Visit: Payer: Self-pay

## 2019-06-23 ENCOUNTER — Encounter (INDEPENDENT_AMBULATORY_CARE_PROVIDER_SITE_OTHER): Payer: Medicare Other

## 2019-06-23 ENCOUNTER — Ambulatory Visit (INDEPENDENT_AMBULATORY_CARE_PROVIDER_SITE_OTHER): Payer: Medicare Other | Admitting: Vascular Surgery

## 2019-06-23 ENCOUNTER — Encounter (INDEPENDENT_AMBULATORY_CARE_PROVIDER_SITE_OTHER): Payer: Self-pay | Admitting: Vascular Surgery

## 2019-06-23 ENCOUNTER — Encounter (INDEPENDENT_AMBULATORY_CARE_PROVIDER_SITE_OTHER): Payer: Self-pay

## 2019-06-23 ENCOUNTER — Other Ambulatory Visit: Payer: Self-pay

## 2019-06-23 ENCOUNTER — Ambulatory Visit (INDEPENDENT_AMBULATORY_CARE_PROVIDER_SITE_OTHER): Payer: Medicare Other

## 2019-06-23 VITALS — BP 140/81 | HR 95 | Resp 16 | Wt 198.0 lb

## 2019-06-23 DIAGNOSIS — I739 Peripheral vascular disease, unspecified: Secondary | ICD-10-CM | POA: Diagnosis not present

## 2019-06-23 DIAGNOSIS — I70212 Atherosclerosis of native arteries of extremities with intermittent claudication, left leg: Secondary | ICD-10-CM | POA: Diagnosis not present

## 2019-06-23 DIAGNOSIS — E785 Hyperlipidemia, unspecified: Secondary | ICD-10-CM | POA: Diagnosis not present

## 2019-06-23 NOTE — Assessment & Plan Note (Signed)
ABIs today are 0.92 on the right and 0.99 on the left with triphasic waveforms and normal digital pressures.  He is doing well.  His 2 years status post intervention.  At this point, it is likely safe to come off of Plavix and go to aspirin therapy only.  We will continue to follow him on an annual basis unless problems develop in the interim.

## 2019-06-23 NOTE — Progress Notes (Signed)
MRN : [id number]  Nathaniel [name] is a 68 y.o. (Aug 22, 1950) male who presents with chief complaint of  Chief Complaint  Patient presents with  . Follow-up    ultrasound follow up  .  History of Present Illness: Patient returns today in follow up of his PAD.  He is about 2 years status post right iliac intervention and left SFA and popliteal intervention for short distance claudication.  His claudication symptoms are markedly improved after intervention.  He is doing well today.  He has significant issues with pronounced bruising and tiredness with the Plavix and would like to come off of Plavix if at all possible. ABIs today are 0.92 on the right and 0.99 on the left with triphasic waveforms and normal digital pressures.   Current Outpatient Medications  Medication Sig Dispense Refill  . clopidogrel (PLAVIX) 75 MG tablet TAKE 1 TABLET BY MOUTH EVERY DAY 90 tablet 2  . esomeprazole (NEXIUM) 20 MG capsule Take 20 mg by mouth daily as needed (heartburn).     . clonazePAM (KLONOPIN) 0.5 MG tablet Take 1/2 tablet by mouth daily for three days as needed for tremors. (Patient not taking: Reported on 06/23/2019) 15 tablet 0  . diphenhydrAMINE (BENADRYL) 25 MG tablet Take 25 mg by mouth every 6 (six) hours as needed for itching.    . Naproxen Sod-Diphenhydramine (ALEVE PM PO) Take by mouth as needed.      No current facility-administered medications for this visit.     Past Medical History:  Diagnosis Date  . Cataract   . Chicken pox   . GERD (gastroesophageal reflux disease)   . Hyperlipidemia   . Pre-diabetes   . Seizures ([place])    None since age 47    Past Surgical History:  Procedure Laterality Date  . BACK SURGERY    . LAMINECTOMY  1987  . LOWER EXTREMITY ANGIOGRAPHY Left 05/02/2017   Procedure: Lower Extremity Angiography;  Surgeon: Algernon Huxley, MD;  Location: [facility];  Service: Cardiovascular;  Laterality: Left;  . TONSILLECTOMY       Social History    Tobacco Use  . Smoking status: Current Every Day Smoker    Packs/day: 1.00    Years: 50.00    Pack years: 50.00    Types: Cigarettes  . Smokeless tobacco: Never Used  Substance Use Topics  . Alcohol use: Yes    Alcohol/week: 0.0 standard drinks    Comment: scotch twice a year. occassionally beer once every 2-3 mths  . Drug use: No     Family History  Problem Relation Age of Onset  . Heart disease Mother        Arrthymia   . Stroke Father        Deceased  . Dementia Father   . Heart disease Brother        Myocardial infarction  . Colon cancer Neg Hx      Allergies  Allergen Reactions  . Prozac [Fluoxetine Hcl] Other (See Comments)    Extreme depression  . Statins     REVIEW OF SYSTEMS(Negative unless checked)  Constitutional: [] ?Weight loss[] ?Fever[] ?Chills Cardiac:[] ?Chest pain[] ?Chest pressure[] ?Palpitations [] ?Shortness of breath when laying flat [] ?Shortness of breath at rest [] ?Shortness of breath with exertion. Vascular: [x] ?Pain in legs with walking[] ?Pain in legsat rest[] ?Pain in legs when laying flat [x] ?Claudication [] ?Pain in feet when walking [] ?Pain in feet at rest [] ?Pain in feet when laying flat [] ?History of DVT [] ?Phlebitis [] ?Swelling in legs [] ?Varicose veins [] ?Non-healing ulcers Pulmonary: [] ?  Uses home oxygen [] ?Productive cough[] ?Hemoptysis [] ?Wheeze [] ?COPD [] ?Asthma Neurologic: [x] ?Dizziness [] ?Blackouts [x] ?Seizures [] ?History of stroke [] ?History of TIA[] ?Aphasia [] ?Temporary blindness[] ?Dysphagia [] ?Weaknessor numbness in arms [] ?Weakness or numbnessin legs Musculoskeletal: [x] ?Arthritis [] ?Joint swelling [] ?Joint pain [] ?Low back pain Hematologic:[] ?Easy bruising[] ?Easy bleeding [] ?Hypercoagulable state [] ?Anemic [] ?Hepatitis Gastrointestinal:[] ?Blood in stool[] ?Vomiting blood[x] ?Gastroesophageal reflux/heartburn[] ?Abdominal pain Genitourinary:  [] ?Chronic kidney disease [] ?Difficulturination [] ?Frequenturination [] ?Burning with urination[] ?Hematuria Skin: [] ?Rashes [] ?Ulcers [] ?Wounds Psychological: [] ?History of anxiety[] ?History of major depression.  Physical Examination  BP 140/81 (BP Location: Right Arm)   Pulse 95   Resp 16   Wt 198 lb (89.8 kg)   BMI 24.10 kg/m  Gen:  WD/WN, NAD Head: [place]/AT, No temporalis wasting. Ear/Nose/Throat: Hearing grossly intact, nares w/o erythema or drainage Eyes: Conjunctiva clear. Sclera non-icteric Neck: Supple.  Trachea midline Pulmonary:  Good air movement, no use of accessory muscles.  Cardiac: RRR, no JVD Vascular:  Vessel Right Left  Radial Palpable Palpable                          PT Palpable Palpable  DP Palpable Palpable    Musculoskeletal: M/S 5/5 throughout.  No deformity or atrophy. No edema. Neurologic: Sensation grossly intact in extremities.  Symmetrical.  Speech is fluent.  Psychiatric: Judgment intact, Mood & affect appropriate for pt's clinical situation. Dermatologic: No rashes or ulcers noted.  No cellulitis or open wounds.       Labs No results found for this or any previous visit (from the past 2160 hour(s)).  Radiology No results found.  Assessment/Plan Hyperlipidemia lipid control important in reducing the progression of atherosclerotic disease.Intolerant to statins.  Atherosclerosis of native arteries of extremity with intermittent claudication (HCC) ABIs today are 0.92 on the right and 0.99 on the left with triphasic waveforms and normal digital pressures.  He is doing well.  His 2 years status post intervention.  At this point, it is likely safe to come off of Plavix and go to aspirin therapy only.  We will continue to follow him on an annual basis unless problems develop in the interim.    Leotis Pain, MD  06/23/2019 11:49 AM    This note was created with Dragon medical transcription system.  Any errors from  dictation are purely unintentional

## 2019-06-23 NOTE — Patient Instructions (Signed)
Peripheral Vascular Disease  Peripheral vascular disease (PVD) is a disease of the blood vessels that are not part of your heart and brain. A simple term for PVD is poor circulation. In most cases, PVD narrows the blood vessels that carry blood from your heart to the rest of your body. This can reduce the supply of blood to your arms, legs, and internal organs, like your stomach or kidneys. However, PVD most often affects a person's lower legs and feet. Without treatment, PVD tends to get worse. PVD can also lead to acute ischemic limb. This is when an arm or leg suddenly cannot get enough blood. This is a medical emergency. Follow these instructions at home: Lifestyle  Do not use any products that contain nicotine or tobacco, such as cigarettes and e-cigarettes. If you need help quitting, ask your doctor.  Lose weight if you are overweight. Or, stay at a healthy weight as told by your doctor.  Eat a diet that is low in fat and cholesterol. If you need help, ask your doctor.  Exercise regularly. Ask your doctor for activities that are right for you. General instructions  Take over-the-counter and prescription medicines only as told by your doctor.  Take good care of your feet: ? Wear comfortable shoes that fit well. ? Check your feet often for any cuts or sores.  Keep all follow-up visits as told by your doctor This is important. Contact a doctor if:  You have cramps in your legs when you walk.  You have leg pain when you are at rest.  You have coldness in a leg or foot.  Your skin changes.  You are unable to get or have an erection (erectile dysfunction).  You have cuts or sores on your feet that do not heal. Get help right away if:  Your arm or leg turns cold, numb, and blue.  Your arms or legs become red, warm, swollen, painful, or numb.  You have chest pain.  You have trouble breathing.  You suddenly have weakness in your face, arm, or leg.  You become very  confused or you cannot speak.  You suddenly have a very bad headache.  You suddenly cannot see. Summary  Peripheral vascular disease (PVD) is a disease of the blood vessels.  A simple term for PVD is poor circulation. Without treatment, PVD tends to get worse.  Treatment may include exercise, low fat and low cholesterol diet, and quitting smoking. This information is not intended to replace advice given to you by your health care provider. Make sure you discuss any questions you have with your health care provider. Document Released: 10/03/2009 Document Revised: 06/21/2017 Document Reviewed: 08/16/2016 Elsevier Patient Education  2020 Elsevier Inc.  

## 2019-06-30 DIAGNOSIS — M5136 Other intervertebral disc degeneration, lumbar region: Secondary | ICD-10-CM | POA: Diagnosis not present

## 2019-06-30 DIAGNOSIS — M5416 Radiculopathy, lumbar region: Secondary | ICD-10-CM | POA: Diagnosis not present

## 2019-06-30 DIAGNOSIS — M9903 Segmental and somatic dysfunction of lumbar region: Secondary | ICD-10-CM | POA: Diagnosis not present

## 2019-06-30 DIAGNOSIS — M9905 Segmental and somatic dysfunction of pelvic region: Secondary | ICD-10-CM | POA: Diagnosis not present

## 2019-07-27 DIAGNOSIS — L570 Actinic keratosis: Secondary | ICD-10-CM | POA: Diagnosis not present

## 2019-07-28 DIAGNOSIS — M5136 Other intervertebral disc degeneration, lumbar region: Secondary | ICD-10-CM | POA: Diagnosis not present

## 2019-07-28 DIAGNOSIS — M9903 Segmental and somatic dysfunction of lumbar region: Secondary | ICD-10-CM | POA: Diagnosis not present

## 2019-07-28 DIAGNOSIS — M5416 Radiculopathy, lumbar region: Secondary | ICD-10-CM | POA: Diagnosis not present

## 2019-07-28 DIAGNOSIS — M9905 Segmental and somatic dysfunction of pelvic region: Secondary | ICD-10-CM | POA: Diagnosis not present

## 2019-07-30 DIAGNOSIS — L570 Actinic keratosis: Secondary | ICD-10-CM | POA: Diagnosis not present

## 2019-08-25 DIAGNOSIS — M9905 Segmental and somatic dysfunction of pelvic region: Secondary | ICD-10-CM | POA: Diagnosis not present

## 2019-08-25 DIAGNOSIS — M9903 Segmental and somatic dysfunction of lumbar region: Secondary | ICD-10-CM | POA: Diagnosis not present

## 2019-08-25 DIAGNOSIS — M5136 Other intervertebral disc degeneration, lumbar region: Secondary | ICD-10-CM | POA: Diagnosis not present

## 2019-08-25 DIAGNOSIS — M5416 Radiculopathy, lumbar region: Secondary | ICD-10-CM | POA: Diagnosis not present

## 2019-09-09 IMAGING — CT CT CHEST LUNG CANCER SCREENING LOW DOSE W/O CM
2 of 5 series · 15 of 40 positions shown, 18 images · non-contrast
Comparison: Low-dose lung cancer screening CT chest dated
01/21/2017

CLINICAL DATA: 67-year-old male current smoker, with 56 pack-year
history of smoking, for follow-up lung cancer screening

EXAM:
CT CHEST WITHOUT CONTRAST LOW-DOSE FOR LUNG CANCER SCREENING
TECHNIQUE: Multidetector CT imaging of the chest was performed following the
standard protocol without IV contrast.

[Series 4: lung · axial · 0.70mm/px · z∈[-1341,-998]mm · 12 of 383 slices shown, 15 images (1 of 2)]
[im 20/383  mediastinal]
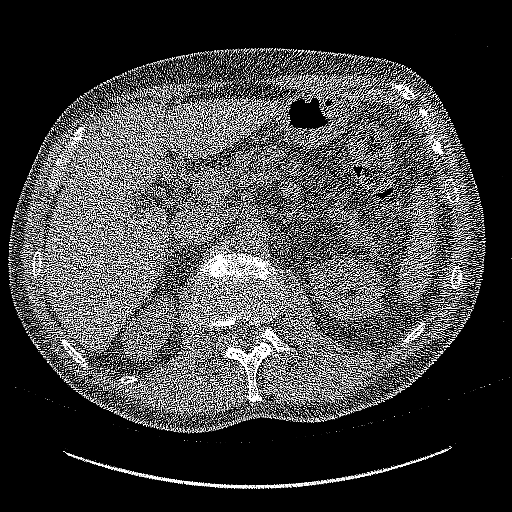
[im 20/383  lung]
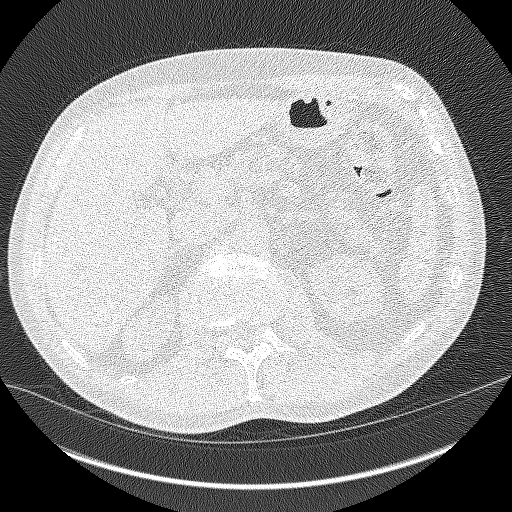
[im 58/383  lung]
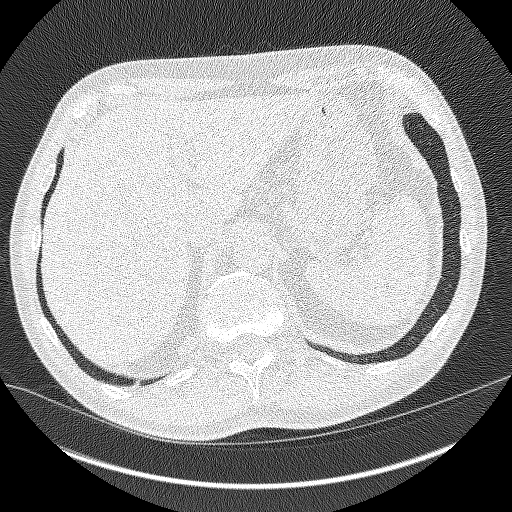
[im 77/383  lung]
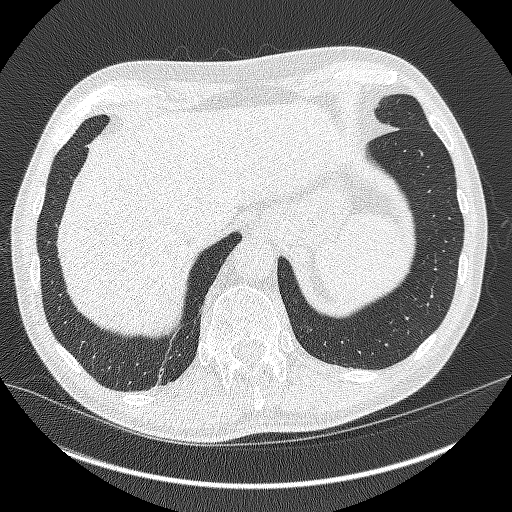
[im 115/383  lung]
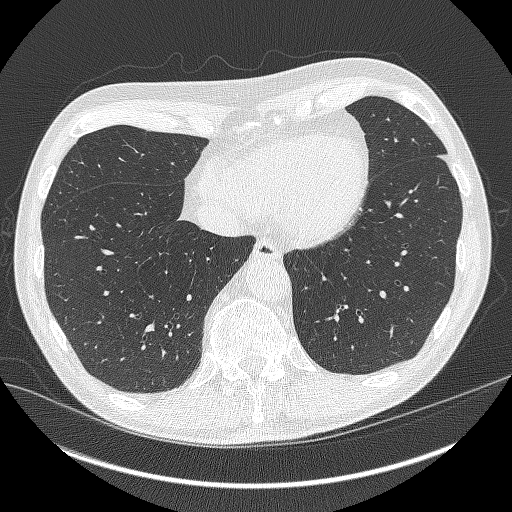
[im 153/383  mediastinal]
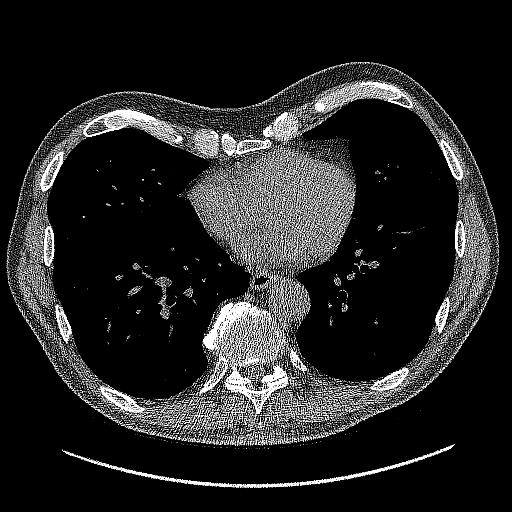
[im 153/383  lung]
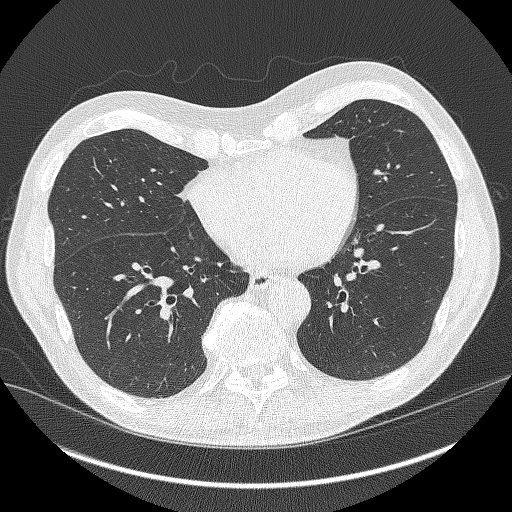
[im 172/383  lung]
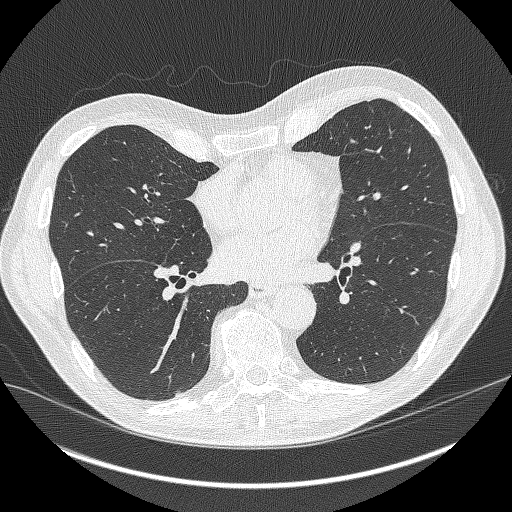
[im 211/383  lung]
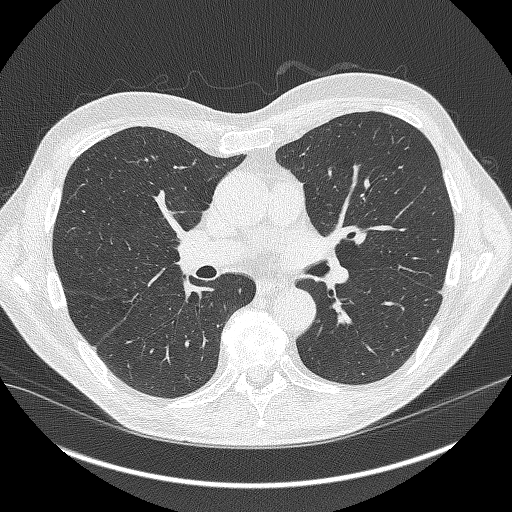
[im 230/383  lung]
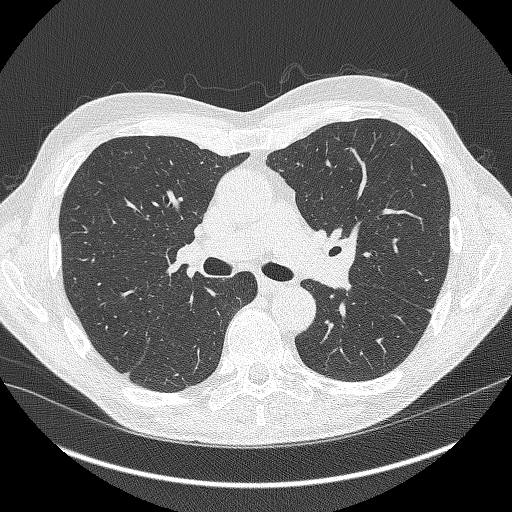
[im 268/383  mediastinal]
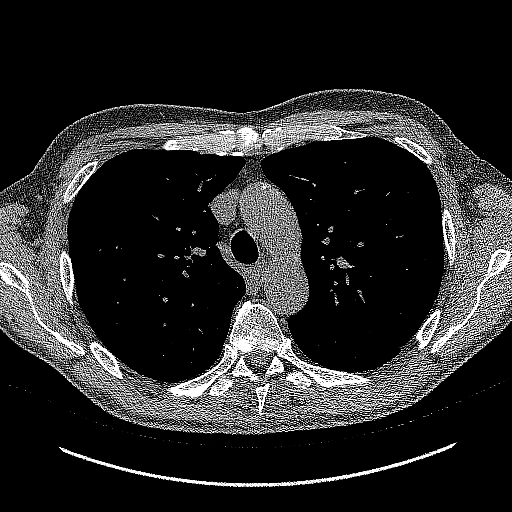
[im 268/383  lung]
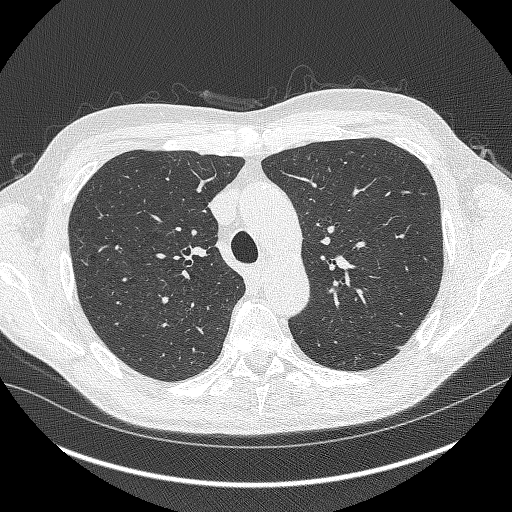
[im 306/383  lung]
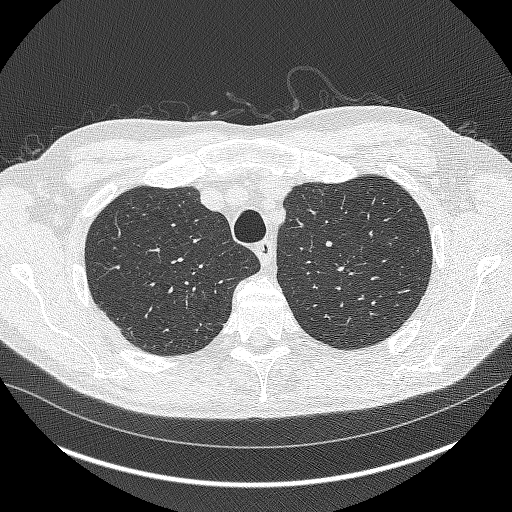
[im 325/383  lung]
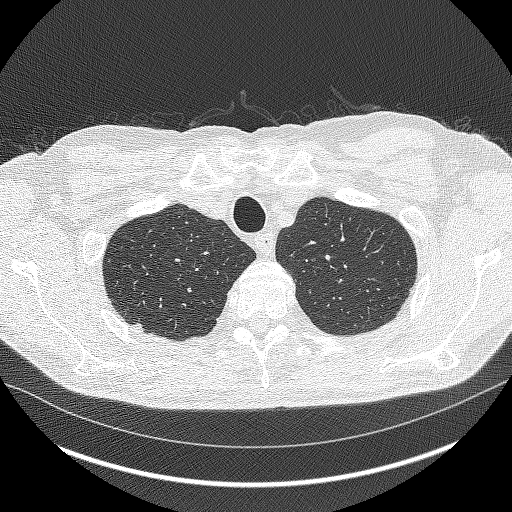
[im 363/383  lung]
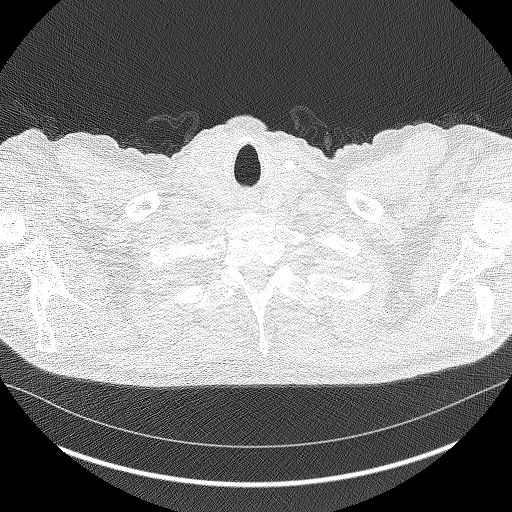

[Series 5: lung · coronal · 0.70mm/px · 3 of 280 slices shown (2 of 2)]
[im 56/280  lung]
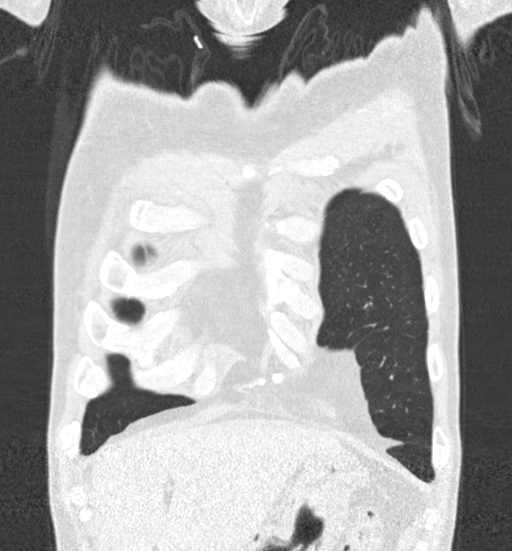
[im 112/280  lung]
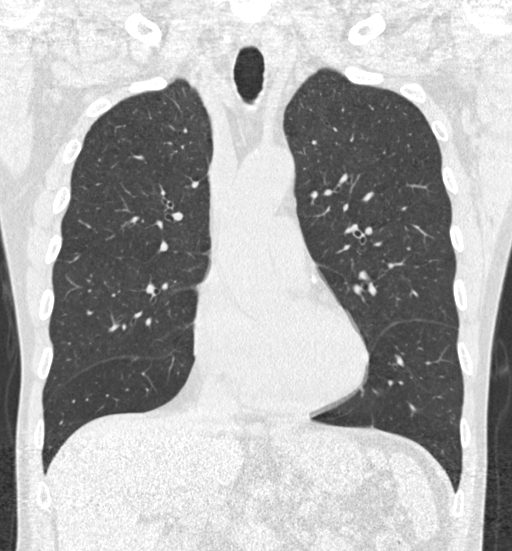
[im 168/280  lung]
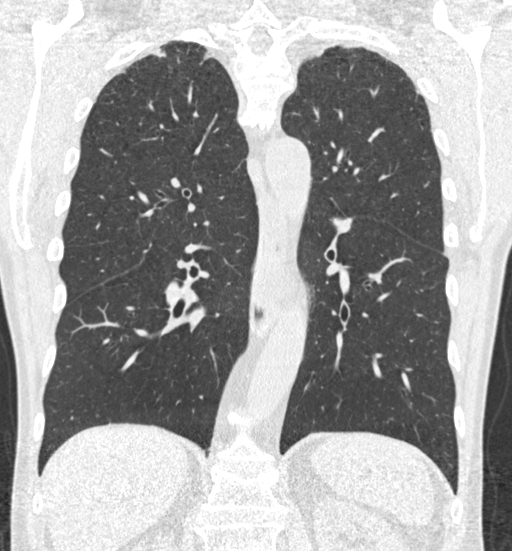

[15 of 40 positions shown; findings below may reference images not displayed]

FINDINGS: Cardiovascular: The heart is normal in size. No pericardial
effusion.

No evidence of thoracic aortic aneurysm.

Mild coronary atherosclerosis of the LAD.

Mediastinum/Nodes: No suspicious mediastinal lymphadenopathy.

Visualized thyroid is notable for a stable 1.8 cm left thyroid
nodule.

Lungs/Pleura: Biapical pleural-parenchymal scarring.

Mild centrilobular and paraseptal emphysematous changes, upper lobe
predominant.

No focal consolidation.

Scattered small bilateral pulmonary nodules, including a 5.8 mm
subpleural nodule in the posterior right upper lobe, likely
reflecting pleural parenchymal scarring.

No pleural effusion or pneumothorax.

Upper Abdomen: Visualized upper abdomen is grossly unremarkable.

Musculoskeletal: Degenerative changes of the visualized
thoracolumbar spine. Mild superior endplate changes at L1.
IMPRESSION: Lung-RADS 2, benign appearance or behavior. Continue annual
screening with low-dose chest CT without contrast in 12 months.

Emphysema ([id number]).

## 2019-09-22 DIAGNOSIS — M9903 Segmental and somatic dysfunction of lumbar region: Secondary | ICD-10-CM | POA: Diagnosis not present

## 2019-09-22 DIAGNOSIS — M5416 Radiculopathy, lumbar region: Secondary | ICD-10-CM | POA: Diagnosis not present

## 2019-09-22 DIAGNOSIS — M5136 Other intervertebral disc degeneration, lumbar region: Secondary | ICD-10-CM | POA: Diagnosis not present

## 2019-09-22 DIAGNOSIS — M9905 Segmental and somatic dysfunction of pelvic region: Secondary | ICD-10-CM | POA: Diagnosis not present

## 2019-10-20 DIAGNOSIS — M5416 Radiculopathy, lumbar region: Secondary | ICD-10-CM | POA: Diagnosis not present

## 2019-10-20 DIAGNOSIS — M9905 Segmental and somatic dysfunction of pelvic region: Secondary | ICD-10-CM | POA: Diagnosis not present

## 2019-10-20 DIAGNOSIS — M9903 Segmental and somatic dysfunction of lumbar region: Secondary | ICD-10-CM | POA: Diagnosis not present

## 2019-10-20 DIAGNOSIS — M5136 Other intervertebral disc degeneration, lumbar region: Secondary | ICD-10-CM | POA: Diagnosis not present

## 2019-11-02 DIAGNOSIS — M9903 Segmental and somatic dysfunction of lumbar region: Secondary | ICD-10-CM | POA: Diagnosis not present

## 2019-11-02 DIAGNOSIS — M5416 Radiculopathy, lumbar region: Secondary | ICD-10-CM | POA: Diagnosis not present

## 2019-11-02 DIAGNOSIS — M9905 Segmental and somatic dysfunction of pelvic region: Secondary | ICD-10-CM | POA: Diagnosis not present

## 2019-11-02 DIAGNOSIS — M5136 Other intervertebral disc degeneration, lumbar region: Secondary | ICD-10-CM | POA: Diagnosis not present

## 2019-11-04 DIAGNOSIS — M5416 Radiculopathy, lumbar region: Secondary | ICD-10-CM | POA: Diagnosis not present

## 2019-11-04 DIAGNOSIS — M5136 Other intervertebral disc degeneration, lumbar region: Secondary | ICD-10-CM | POA: Diagnosis not present

## 2019-11-04 DIAGNOSIS — M9905 Segmental and somatic dysfunction of pelvic region: Secondary | ICD-10-CM | POA: Diagnosis not present

## 2019-11-04 DIAGNOSIS — M9903 Segmental and somatic dysfunction of lumbar region: Secondary | ICD-10-CM | POA: Diagnosis not present

## 2019-11-09 DIAGNOSIS — M5416 Radiculopathy, lumbar region: Secondary | ICD-10-CM | POA: Diagnosis not present

## 2019-11-09 DIAGNOSIS — M5136 Other intervertebral disc degeneration, lumbar region: Secondary | ICD-10-CM | POA: Diagnosis not present

## 2019-11-09 DIAGNOSIS — M9905 Segmental and somatic dysfunction of pelvic region: Secondary | ICD-10-CM | POA: Diagnosis not present

## 2019-11-09 DIAGNOSIS — M9903 Segmental and somatic dysfunction of lumbar region: Secondary | ICD-10-CM | POA: Diagnosis not present

## 2019-11-11 DIAGNOSIS — M9905 Segmental and somatic dysfunction of pelvic region: Secondary | ICD-10-CM | POA: Diagnosis not present

## 2019-11-11 DIAGNOSIS — M5136 Other intervertebral disc degeneration, lumbar region: Secondary | ICD-10-CM | POA: Diagnosis not present

## 2019-11-11 DIAGNOSIS — M9903 Segmental and somatic dysfunction of lumbar region: Secondary | ICD-10-CM | POA: Diagnosis not present

## 2019-11-11 DIAGNOSIS — M5416 Radiculopathy, lumbar region: Secondary | ICD-10-CM | POA: Diagnosis not present

## 2019-11-12 ENCOUNTER — Telehealth (INDEPENDENT_AMBULATORY_CARE_PROVIDER_SITE_OTHER): Payer: Self-pay | Admitting: Vascular Surgery

## 2019-11-12 NOTE — Telephone Encounter (Signed)
Called stating left leg get tired after about 2 minutes of walking. FB overheard the conversation and told me to schedule him for ABI's. This phone note is just for documenting purposes.

## 2019-11-13 ENCOUNTER — Encounter (INDEPENDENT_AMBULATORY_CARE_PROVIDER_SITE_OTHER): Payer: Self-pay

## 2019-11-13 ENCOUNTER — Ambulatory Visit (INDEPENDENT_AMBULATORY_CARE_PROVIDER_SITE_OTHER): Payer: Medicare Other

## 2019-11-13 ENCOUNTER — Other Ambulatory Visit: Payer: Self-pay

## 2019-11-13 ENCOUNTER — Ambulatory Visit (INDEPENDENT_AMBULATORY_CARE_PROVIDER_SITE_OTHER): Payer: Medicare Other | Admitting: Nurse Practitioner

## 2019-11-13 ENCOUNTER — Other Ambulatory Visit
Admission: RE | Admit: 2019-11-13 | Discharge: 2019-11-13 | Disposition: A | Discharge status: Home / Self Care | Payer: Medicare Other | Source: Ambulatory Visit | Attending: Vascular Surgery | Admitting: Vascular Surgery

## 2019-11-13 ENCOUNTER — Encounter (INDEPENDENT_AMBULATORY_CARE_PROVIDER_SITE_OTHER): Payer: Self-pay | Admitting: Nurse Practitioner

## 2019-11-13 VITALS — BP 131/86 | HR 80 | Resp 16 | Wt 200.0 lb

## 2019-11-13 DIAGNOSIS — I70212 Atherosclerosis of native arteries of extremities with intermittent claudication, left leg: Secondary | ICD-10-CM | POA: Diagnosis not present

## 2019-11-13 DIAGNOSIS — I998 Other disorder of circulatory system: Secondary | ICD-10-CM | POA: Diagnosis not present

## 2019-11-13 DIAGNOSIS — Z01812 Encounter for preprocedural laboratory examination: Secondary | ICD-10-CM | POA: Insufficient documentation

## 2019-11-13 DIAGNOSIS — E785 Hyperlipidemia, unspecified: Secondary | ICD-10-CM | POA: Diagnosis not present

## 2019-11-13 DIAGNOSIS — Z720 Tobacco use: Secondary | ICD-10-CM

## 2019-11-13 DIAGNOSIS — Z20822 Contact with and (suspected) exposure to covid-19: Secondary | ICD-10-CM | POA: Diagnosis not present

## 2019-11-13 LAB — SARS CORONAVIRUS 2 (TAT 6-24 HRS): SARS Coronavirus 2: NEGATIVE

## 2019-11-13 NOTE — Progress Notes (Signed)
Subjective:    Patient ID: Nathaniel [name], male    DOB: Nov 25, 1950, 69 y.o.   MRN: [id number] Chief Complaint  Patient presents with  . Follow-up    Today patient presents after having about a week and a half of sudden claudication-like symptoms.  The patient has had previous interventions in both the right and left lower extremities.  The patient states that he was in his normal state of health when suddenly he began to have pain and claudication-like symptoms with ambulation.  He states that he can barely walk for 10 minutes without having severe pain.  He denies any rest pain like symptoms.  He also denies any constant pain.  He states that his greatest discomfort is when he is ambulating.  He denies any open wounds or ulcerations.  The patient underwent noninvasive studies which shows an ABI of 1.23 on the right and 0.53 on the left.  Previously on 06/23/2019 the right ABI was 0.93 and the left was 0.99.  The right lower extremity has triphasic tibial artery waveforms whereas the left has monophasic waveforms.  Left duplex imaging was done in the left mid, distal SFA and stent is occluded with a collateral seen in the mid SFA feeding to the popliteal/TP trunk distally.  The ATA and PTA are patent with no flow detected in the great toe.   Review of Systems  Cardiovascular:       Claudication  Skin: Positive for color change.  All other systems reviewed and are negative.      Objective:   Physical Exam Vitals reviewed.  Constitutional:      Appearance: Normal appearance. He is normal weight.  HENT:     Head: Normocephalic.  Cardiovascular:     Rate and Rhythm: Normal rate and regular rhythm.     Pulses: Normal pulses.  Skin:    General: Skin is cool.     Capillary Refill: Capillary refill takes more than 3 seconds.     Coloration: Skin is cyanotic (Left great toe).  Neurological:     Mental Status: He is alert.     BP 131/86 (BP Location: Right Arm)   Pulse 80   Resp  16   Wt 200 lb (90.7 kg)   BMI 24.34 kg/m   Past Medical History:  Diagnosis Date  . Cataract   . Chicken pox   . GERD (gastroesophageal reflux disease)   . Hyperlipidemia   . Pre-diabetes   . Seizures ([place])    None since age 56    Social History   Socioeconomic History  . Marital status: Married    Spouse name: Not on file  . Number of children: Not on file  . Years of education: Not on file  . Highest education level: Not on file  Occupational History  . Not on file  Tobacco Use  . Smoking status: Current Every Day Smoker    Packs/day: 1.00    Years: 50.00    Pack years: 50.00    Types: Cigarettes  . Smokeless tobacco: Never Used  Substance and Sexual Activity  . Alcohol use: Yes    Alcohol/week: 0.0 standard drinks    Comment: scotch twice a year. occassionally beer once every 2-3 mths  . Drug use: No  . Sexual activity: Not on file  Other Topics Concern  . Not on file  Social History Narrative   Retired.   Stays busy with household work, Surveyor, mining.   Married for 3  years.   Grown children.   Enjoys reading.    Social Determinants of Health   Financial Resource Strain:   . Difficulty of Paying Living Expenses:   Food Insecurity:   . Worried About Charity fundraiser in the Last Year:   . Arboriculturist in the Last Year:   Transportation Needs:   . Film/video editor (Medical):   Marland Kitchen Lack of Transportation (Non-Medical):   Physical Activity:   . Days of Exercise per Week:   . Minutes of Exercise per Session:   Stress:   . Feeling of Stress :   Social Connections:   . Frequency of Communication with Friends and Family:   . Frequency of Social Gatherings with Friends and Family:   . Attends Religious Services:   . Active Member of Clubs or Organizations:   . Attends Archivist Meetings:   Marland Kitchen Marital Status:   Intimate Partner Violence:   . Fear of Current or Ex-Partner:   . Emotionally Abused:   Marland Kitchen Physically Abused:   . Sexually  Abused:     Past Surgical History:  Procedure Laterality Date  . BACK SURGERY    . LAMINECTOMY  1987  . LOWER EXTREMITY ANGIOGRAPHY Left 05/02/2017   Procedure: Lower Extremity Angiography;  Surgeon: Algernon Huxley, MD;  Location: [facility];  Service: Cardiovascular;  Laterality: Left;  . TONSILLECTOMY      Family History  Problem Relation Age of Onset  . Heart disease Mother        Arrthymia   . Stroke Father        Deceased  . Dementia Father   . Heart disease Brother        Myocardial infarction  . Colon cancer Neg Hx     Allergies  Allergen Reactions  . Prozac [Fluoxetine Hcl] Other (See Comments)    Extreme depression  . Statins        Assessment & Plan:   1. Ischemic leg  Recommend:  The patient has evidence of severe atherosclerotic changes of the left lower extremity resulting from occlusion of his left distal SFA/proximal popliteal artery stent.  This represents a limb threatening ischemia and places the patient at the risk for limb loss.  Patient should undergo angiography of the left lower extremity with the hope for intervention for limb salvage.  The risks and benefits as well as the alternative therapies was discussed in detail with the patient.  All questions were answered.  Patient agrees to proceed with angiography.  The patient will follow up with me in the office after the procedure.    2. Tobacco abuse Smoking cessation was discussed, 3-10 minutes spent on this topic specifically   3. Hyperlipidemia, unspecified hyperlipidemia type Continue statin as ordered and reviewed, no changes at this time    Current Outpatient Medications on File Prior to Visit  Medication Sig Dispense Refill  . diphenhydrAMINE (BENADRYL) 25 MG tablet Take 25 mg by mouth every 6 (six) hours as needed for itching.    . esomeprazole (NEXIUM) 20 MG capsule Take 20 mg by mouth daily as needed (heartburn).     Marland Kitchen ibuprofen (ADVIL) 200 MG tablet Take 200 mg by  mouth every 6 (six) hours as needed.    . clonazePAM (KLONOPIN) 0.5 MG tablet Take 1/2 tablet by mouth daily for three days as needed for tremors. (Patient not taking: Reported on 11/13/2019) 15 tablet 0  . clopidogrel (PLAVIX) 75 MG  tablet TAKE 1 TABLET BY MOUTH EVERY DAY (Patient not taking: Reported on 11/13/2019) 90 tablet 2  . Naproxen Sod-Diphenhydramine (ALEVE PM PO) Take by mouth as needed.      No current facility-administered medications on file prior to visit.    There are no Patient Instructions on file for this visit. No follow-ups on file.   Kris Hartmann, NP

## 2019-11-15 ENCOUNTER — Other Ambulatory Visit (INDEPENDENT_AMBULATORY_CARE_PROVIDER_SITE_OTHER): Payer: Self-pay | Admitting: Nurse Practitioner

## 2019-11-16 ENCOUNTER — Encounter: Payer: Self-pay | Admitting: Anesthesiology

## 2019-11-16 ENCOUNTER — Other Ambulatory Visit: Payer: Self-pay

## 2019-11-16 ENCOUNTER — Observation Stay
Admission: RE | Admit: 2019-11-16 | Discharge: 2019-11-17 | Disposition: A | Discharge status: Home / Self Care | Payer: Medicare Other | Attending: Vascular Surgery | Admitting: Vascular Surgery

## 2019-11-16 ENCOUNTER — Encounter
Admission: RE | Disposition: A | Discharge status: Home / Self Care | Payer: Self-pay | Source: Home / Self Care | Attending: Vascular Surgery

## 2019-11-16 ENCOUNTER — Encounter: Payer: Self-pay | Admitting: Vascular Surgery

## 2019-11-16 DIAGNOSIS — Z7902 Long term (current) use of antithrombotics/antiplatelets: Secondary | ICD-10-CM | POA: Insufficient documentation

## 2019-11-16 DIAGNOSIS — H269 Unspecified cataract: Secondary | ICD-10-CM | POA: Diagnosis not present

## 2019-11-16 DIAGNOSIS — F17210 Nicotine dependence, cigarettes, uncomplicated: Secondary | ICD-10-CM | POA: Diagnosis not present

## 2019-11-16 DIAGNOSIS — R7303 Prediabetes: Secondary | ICD-10-CM | POA: Insufficient documentation

## 2019-11-16 DIAGNOSIS — I70212 Atherosclerosis of native arteries of extremities with intermittent claudication, left leg: Secondary | ICD-10-CM | POA: Diagnosis not present

## 2019-11-16 DIAGNOSIS — Z8669 Personal history of other diseases of the nervous system and sense organs: Secondary | ICD-10-CM | POA: Insufficient documentation

## 2019-11-16 DIAGNOSIS — Z888 Allergy status to other drugs, medicaments and biological substances status: Secondary | ICD-10-CM | POA: Diagnosis not present

## 2019-11-16 DIAGNOSIS — K219 Gastro-esophageal reflux disease without esophagitis: Secondary | ICD-10-CM | POA: Insufficient documentation

## 2019-11-16 DIAGNOSIS — I998 Other disorder of circulatory system: Secondary | ICD-10-CM

## 2019-11-16 DIAGNOSIS — E785 Hyperlipidemia, unspecified: Secondary | ICD-10-CM | POA: Diagnosis not present

## 2019-11-16 DIAGNOSIS — Z8249 Family history of ischemic heart disease and other diseases of the circulatory system: Secondary | ICD-10-CM | POA: Insufficient documentation

## 2019-11-16 DIAGNOSIS — Z79899 Other long term (current) drug therapy: Secondary | ICD-10-CM | POA: Diagnosis not present

## 2019-11-16 HISTORY — PX: LOWER EXTREMITY ANGIOGRAPHY: CATH118251

## 2019-11-16 HISTORY — DX: Peripheral vascular disease, unspecified: I73.9

## 2019-11-16 LAB — CREATININE, SERUM
Creatinine, Ser: 1.14 mg/dL (ref 0.61–1.24)
GFR calc Af Amer: >60 mL/min (ref >60)
GFR calc non Af Amer: >60 mL/min (ref >60)

## 2019-11-16 LAB — BUN: BUN: 16 mg/dL (ref 8–23)

## 2019-11-16 SURGERY — LOWER EXTREMITY ANGIOGRAPHY
Anesthesia: Moderate Sedation | Laterality: Left

## 2019-11-16 MED ORDER — FENTANYL CITRATE (PF) 100 MCG/2ML IJ SOLN
INTRAMUSCULAR | Status: AC
  Filled 2019-11-16: qty 2

## 2019-11-16 MED ORDER — ATORVASTATIN CALCIUM 10 MG PO TABS
10.0000 mg | ORAL_TABLET | Freq: Every day | ORAL | Status: DC
  Filled 2019-11-16 (×2): qty 1

## 2019-11-16 MED ORDER — TIROFIBAN (AGGRASTAT) BOLUS VIA INFUSION
25.0000 ug/kg | Freq: Once | INTRAVENOUS | Status: AC
  Filled 2019-11-16: qty 46

## 2019-11-16 MED ORDER — SODIUM CHLORIDE 0.9 % IV SOLN: INTRAVENOUS | Status: DC

## 2019-11-16 MED ORDER — MIDAZOLAM HCL 5 MG/5ML IJ SOLN
INTRAMUSCULAR | Status: AC
  Filled 2019-11-16: qty 5

## 2019-11-16 MED ORDER — TIROFIBAN HCL IV 12.5 MG/250 ML
INTRAVENOUS | Status: AC
  Administered 2019-11-16: 2267.5 ug via INTRAVENOUS
  Filled 2019-11-16: qty 250

## 2019-11-16 MED ORDER — HEPARIN SODIUM (PORCINE) 1000 UNIT/ML IJ SOLN
INTRAMUSCULAR | Status: DC | PRN
  Administered 2019-11-16: 5000 [IU] via INTRAVENOUS

## 2019-11-16 MED ORDER — IODIXANOL 320 MG/ML IV SOLN
INTRAVENOUS | Status: DC | PRN
  Administered 2019-11-16: 105 mL via INTRA_ARTERIAL

## 2019-11-16 MED ORDER — DIPHENHYDRAMINE HCL 50 MG/ML IJ SOLN: 50.0000 mg | Freq: Once | INTRAMUSCULAR | Status: DC | PRN

## 2019-11-16 MED ORDER — ACETAMINOPHEN 325 MG PO TABS: 650.0000 mg | ORAL_TABLET | Freq: Four times a day (QID) | ORAL | Status: DC | PRN

## 2019-11-16 MED ORDER — OXYCODONE HCL 5 MG PO TABS: 5.0000 mg | ORAL_TABLET | ORAL | Status: DC | PRN

## 2019-11-16 MED ORDER — HYDROMORPHONE HCL 1 MG/ML IJ SOLN: 1.0000 mg | Freq: Once | INTRAMUSCULAR | Status: DC | PRN

## 2019-11-16 MED ORDER — HEPARIN SODIUM (PORCINE) 1000 UNIT/ML IJ SOLN
INTRAMUSCULAR | Status: AC
  Filled 2019-11-16: qty 1

## 2019-11-16 MED ORDER — ASPIRIN EC 81 MG PO TBEC: 81.0000 mg | DELAYED_RELEASE_TABLET | Freq: Every day | ORAL | Status: DC

## 2019-11-16 MED ORDER — ASPIRIN EC 81 MG PO TBEC
81.0000 mg | DELAYED_RELEASE_TABLET | Freq: Every day | ORAL | Status: DC
  Administered 2019-11-17: 81 mg via ORAL
  Filled 2019-11-16: qty 1

## 2019-11-16 MED ORDER — METHYLPREDNISOLONE SODIUM SUCC 125 MG IJ SOLR: 125.0000 mg | Freq: Once | INTRAMUSCULAR | Status: DC | PRN

## 2019-11-16 MED ORDER — MIDAZOLAM HCL 2 MG/2ML IJ SOLN
INTRAMUSCULAR | Status: DC | PRN
  Administered 2019-11-16: 1 mg via INTRAVENOUS
  Administered 2019-11-16 (×2): 1 mg
  Administered 2019-11-16: 2 mg via INTRAVENOUS

## 2019-11-16 MED ORDER — MIDAZOLAM HCL 2 MG/ML PO SYRP: 8.0000 mg | ORAL_SOLUTION | Freq: Once | ORAL | Status: DC | PRN

## 2019-11-16 MED ORDER — CEFAZOLIN SODIUM-DEXTROSE 2-4 GM/100ML-% IV SOLN
2.0000 g | Freq: Once | INTRAVENOUS | Status: AC
  Administered 2019-11-16: 2 g via INTRAVENOUS

## 2019-11-16 MED ORDER — PANTOPRAZOLE SODIUM 40 MG PO TBEC
40.0000 mg | DELAYED_RELEASE_TABLET | Freq: Every day | ORAL | Status: DC
  Administered 2019-11-16: 40 mg via ORAL
  Filled 2019-11-16 (×2): qty 1

## 2019-11-16 MED ORDER — TIROFIBAN HCL IN NACL 5-0.9 MG/100ML-% IV SOLN
0.1500 ug/kg/min | INTRAVENOUS | Status: DC
  Administered 2019-11-17: 0.15 ug/kg/min via INTRAVENOUS
  Filled 2019-11-16 (×3): qty 100

## 2019-11-16 MED ORDER — FAMOTIDINE 20 MG PO TABS: 40.0000 mg | ORAL_TABLET | Freq: Once | ORAL | Status: DC | PRN

## 2019-11-16 MED ORDER — FENTANYL CITRATE (PF) 100 MCG/2ML IJ SOLN
INTRAMUSCULAR | Status: DC | PRN
  Administered 2019-11-16 (×2): 25 ug
  Administered 2019-11-16: 50 ug via INTRAVENOUS
  Administered 2019-11-16: 25 ug via INTRAVENOUS

## 2019-11-16 MED ORDER — NITROGLYCERIN 1 MG/10 ML FOR IR/CATH LAB
INTRA_ARTERIAL | Status: DC | PRN
  Administered 2019-11-16: 200 ug
  Administered 2019-11-16: 250 ug

## 2019-11-16 MED ORDER — ONDANSETRON HCL 4 MG/2ML IJ SOLN: 4.0000 mg | Freq: Four times a day (QID) | INTRAMUSCULAR | Status: DC | PRN

## 2019-11-16 SURGICAL SUPPLY — 20 items
BALLN LUTONIX 018 5X300X130 (BALLOONS) ×3
BALLN ULTRVRSE 2.5X220X150 (BALLOONS) ×3
BALLN ULTRVRSE 7X80X130C (BALLOONS) ×3
BALLOON LUTONIX 018 5X300X130 (BALLOONS) ×1 IMPLANT
BALLOON ULTRVRSE 2.5X220X150 (BALLOONS) ×1 IMPLANT
BALLOON ULTRVRSE 7X80X130C (BALLOONS) ×1 IMPLANT
CATH BEACON 5 .038 100 VERT TP (CATHETERS) ×3 IMPLANT
CATH PIG 70CM (CATHETERS) ×6 IMPLANT
CATH ROTAREX 135 6FR (CATHETERS) ×3 IMPLANT
DEVICE PRESTO INFLATION (MISCELLANEOUS) ×3 IMPLANT
DEVICE STARCLOSE SE CLOSURE (Vascular Products) ×3 IMPLANT
GLIDEWIRE ADV .035X260CM (WIRE) ×3 IMPLANT
PACK ANGIOGRAPHY (CUSTOM PROCEDURE TRAY) ×3 IMPLANT
SHEATH ANL2 6FRX45 HC (SHEATH) ×3 IMPLANT
SHEATH BRITE TIP 5FRX11 (SHEATH) ×3 IMPLANT
STENT VIABAHN 6X150X120 (Permanent Stent) ×3 IMPLANT
STENT VIABAHN 6X250X120 (Permanent Stent) ×3 IMPLANT
TUBING CONTRAST HIGH PRESS 72 (TUBING) ×6 IMPLANT
WIRE G V18X300CM (WIRE) ×3 IMPLANT
WIRE J 3MM .035X145CM (WIRE) ×3 IMPLANT

## 2019-11-16 NOTE — Progress Notes (Signed)
Brief Pharmacy Note  Pharmacy consulted for tirofiban for peripheral intervention. Order for tirofiban 25 mcg/kg bolus followed by infusion at 0.15 mcg/kg/min x 18 hours. Will check CBC with morning labs.  Dorena Bodo, PharmD

## 2019-11-16 NOTE — H&P (Signed)
[facility] History & Physical Update  The patient was interviewed and re-examined.  The patient's previous History and Physical has been reviewed and is unchanged.  There is no change in the plan of care. We plan to proceed with the scheduled procedure.  Leotis Pain, MD  11/16/2019, 9:27 AM

## 2019-11-16 NOTE — Op Note (Signed)
[facility]  Percutaneous Study/Intervention Procedural Note   Date of Surgery: 11/16/2019  Surgeon(s):Deshana Rominger    Assistants:none  Pre-operative Diagnosis: PAD with claudication left lower extremity  Post-operative diagnosis:  Same  Procedure(s) Performed:             1.  Ultrasound guidance for vascular access right femoral artery             2.  Catheter placement into left common femoral artery from right femoral approach             3.  Aortogram and selective left lower extremity angiogram             4.   Percutaneous transluminal angioplasty of the right external iliac artery with 7 mm diameter by 8 cm length angioplasty             5.   Mechanical thrombectomy with the Bard roto-Rx device to the left SFA and popliteal arteries  6.  Viabahn stent placement x2 to the left SFA and popliteal artery with a 6 mm diameter by 25 cm length and a 6 mm diameter by 15 cm length stents  7.  Percutaneous transluminal angioplasty of the left posterior tibial artery with 2.5 mm diameter by 22 cm length angioplasty with             8.  StarClose closure device right femoral artery  EBL: 100 cc  Contrast: 105 cc  Fluoro Time: 13.4 minutes  Moderate Conscious Sedation Time: approximately 55 minutes using 5 mg of Versed and 125 mcg of Fentanyl              Indications:  Patient is a 69 y.o.male with recurrent short distance claudication and some early rest pain of the left leg after a previous intervention in 2018. The patient has noninvasive study showing thrombosis of his previous intervention. The patient is brought in for angiography for further evaluation and potential treatment.  Risks and benefits are discussed and informed consent is obtained.   Procedure:  The patient was identified and appropriate procedural time out was performed.  The patient was then placed supine on the table and prepped and draped in the usual sterile fashion. Moderate conscious sedation  was administered during a face to face encounter with the patient throughout the procedure with my supervision of the RN administering medicines and monitoring the patient's vital signs, pulse oximetry, telemetry and mental status throughout from the start of the procedure until the patient was taken to the recovery room. Ultrasound was used to evaluate the right common femoral artery.  It was patent .  A digital ultrasound image was acquired.  A Seldinger needle was used to access the right common femoral artery under direct ultrasound guidance and a permanent image was performed.  A 0.035 J wire was advanced without resistance and a 5Fr sheath was placed.  Pigtail catheter was placed into the aorta and an AP aortogram was performed. This demonstrated normal renal arteries and normal aorta and iliac segments without significant stenosis. I then crossed the aortic bifurcation and advanced to the left femoral head. Selective left lower extremity angiogram was then performed. This demonstrated abrupt occlusion of the left SFA in the proximal to mid segment about 8 to 10 cm above the previously placed stent.  There was reconstitution in the above-knee popliteal artery just below the previously placed stent.  The tibial vessels did not opacify particularly well on the initial imaging, and  were better seen with more distal imaging later on. It was felt that it was in the patient's best interest to proceed with intervention after these images to avoid a second procedure and a larger amount of contrast and fluoroscopy based off of the findings from the initial angiogram.  I started by performing angioplasty of the right external iliac artery with a putting the up and over sheath in.  The patient was systemically heparinized and a 7 mm diameter by 8 cm length angioplasty balloon was inflated to 10 atm for 1 minute in the right external iliac artery and distal common iliac artery.  Completion imaging showed only about a 20  to 25% residual stenosis.  At this point, turned my attention to the left leg and a 6 French Ansell sheath was then placed over the Terumo Advantage wire. I then used a Kumpe catheter and the advantage wire to easily navigate through the occlusion that was clearly more thrombotic in nature.  I then exchanged for a 0.018 wire and proceeded with thrombectomy using the roto-Rx device to the left SFA and popliteal arteries.  3 passes were made and a large amount of thrombus were removed.  This uncovered a high-grade near occlusive stenosis about 8 to 10 cm proximal to the previously placed stent, moderate restenosis in the proximal to midportion but also and thrombus and stenosis that was at least 75% below the previously placed stent the above-knee popliteal artery.  I elected to perform primary stenting to cover all these areas.  A 6 mm diameter by 25 cm length Viabahn stent was deployed from just above the knee up to the mid SFA.  A 6 mm diameter by 15 cm length Viabahn stent was then bridged up from this to above the proximal stenosis.  These were postdilated with 5 mm diameter Lutonix drug-coated angioplasty balloon.  The SFA and popliteal arteries were patent with less than 10% residual stenosis after treatment, but that distal perfusion appeared very sluggish.  Catheter was placed into the posterior tibial artery and this was found to be continuous into the foot.  Catheter was then placed into the anterior tibial artery where selective imaging showed an occlusion in the mid segment that appeared abrupt and likely thrombus.  2 treatments with intra-arterial nitroglycerin was given, but no improvement was seen.  I then treated the anterior tibial artery with a 2.5 mm diameter by 22 cm length angioplasty balloon but there remains sluggish flow into the foot through the anterior tibial artery.  The peroneal artery appeared to have spasm from initial wire placement, but otherwise had decent flow.  With the posterior  tibial artery making patent, I elected to place the patient on Aggrastat to try to help with the distal perfusion. I elected to terminate the procedure. The sheath was removed and StarClose closure device was deployed in the right femoral artery with excellent hemostatic result. The patient was taken to the recovery room in stable condition having tolerated the procedure well.  Findings:               Aortogram:  normal renal arteries, normal aorta.  The iliac arteries were somewhat irregular.  On the left side the irregularity was mild.  On the right side the wire was hanging up and there appeared to be at least a 60% stenosis in the proximal right external iliac artery.             Left lower Extremity:  This demonstrated abrupt occlusion   of the left SFA in the proximal to mid segment about 8 to 10 cm above the previously placed stent.  There was reconstitution in the above-knee popliteal artery just below the previously placed stent.  The tibial vessels did not opacify particularly well on the initial imaging   Disposition: Patient was taken to the recovery room in stable condition having tolerated the procedure well.  Complications: None  Jason Dew 11/16/2019 1:14 PM   This note was created with Dragon Medical transcription system. Any errors in dictation are purely unintentional. 

## 2019-11-17 DIAGNOSIS — E785 Hyperlipidemia, unspecified: Secondary | ICD-10-CM | POA: Diagnosis not present

## 2019-11-17 DIAGNOSIS — I998 Other disorder of circulatory system: Secondary | ICD-10-CM

## 2019-11-17 DIAGNOSIS — R7303 Prediabetes: Secondary | ICD-10-CM | POA: Diagnosis not present

## 2019-11-17 DIAGNOSIS — F17210 Nicotine dependence, cigarettes, uncomplicated: Secondary | ICD-10-CM | POA: Diagnosis not present

## 2019-11-17 DIAGNOSIS — K219 Gastro-esophageal reflux disease without esophagitis: Secondary | ICD-10-CM | POA: Diagnosis not present

## 2019-11-17 DIAGNOSIS — H269 Unspecified cataract: Secondary | ICD-10-CM | POA: Diagnosis not present

## 2019-11-17 DIAGNOSIS — I70212 Atherosclerosis of native arteries of extremities with intermittent claudication, left leg: Secondary | ICD-10-CM | POA: Diagnosis not present

## 2019-11-17 LAB — BASIC METABOLIC PANEL
Anion gap: 6 (ref 5–15)
BUN: 13 mg/dL (ref 8–23)
CO2: 26 mmol/L (ref 22–32)
Calcium: 8.7 mg/dL — ABNORMAL LOW (ref 8.9–10.3)
Chloride: 106 mmol/L (ref 98–111)
Creatinine, Ser: 0.99 mg/dL (ref 0.61–1.24)
GFR calc Af Amer: >60 mL/min (ref >60)
GFR calc non Af Amer: >60 mL/min (ref >60)
Glucose, Bld: 102 mg/dL — ABNORMAL HIGH (ref 70–99)
Potassium: 3.9 mmol/L (ref 3.5–5.1)
Sodium: 138 mmol/L (ref 135–145)

## 2019-11-17 LAB — MAGNESIUM: Magnesium: 1.8 mg/dL (ref 1.7–2.4)

## 2019-11-17 LAB — CBC
HCT: 41.9 % (ref 39.0–52.0)
Hemoglobin: 14.3 g/dL (ref 13.0–17.0)
MCH: 31.5 pg (ref 26.0–34.0)
MCHC: 34.1 g/dL (ref 30.0–36.0)
MCV: 92.3 fL (ref 80.0–100.0)
Platelets: 250 10*3/uL (ref 150–400)
RBC: 4.54 MIL/uL (ref 4.22–5.81)
RDW: 12.8 % (ref 11.5–15.5)
WBC: 13.7 10*3/uL — ABNORMAL HIGH (ref 4.0–10.5)
nRBC: 0 % (ref 0.0–0.2)

## 2019-11-17 MED ORDER — CLOPIDOGREL BISULFATE 75 MG PO TABS: 75.0000 mg | ORAL_TABLET | Freq: Every day | ORAL | 3 refills | Status: DC

## 2019-11-17 MED ORDER — ASPIRIN 81 MG PO TBEC: 81.0000 mg | DELAYED_RELEASE_TABLET | Freq: Every day | ORAL | 3 refills | Status: DC

## 2019-11-17 MED ORDER — CLOPIDOGREL BISULFATE 75 MG PO TABS
75.0000 mg | ORAL_TABLET | Freq: Every day | ORAL | Status: DC
  Administered 2019-11-17: 75 mg via ORAL
  Filled 2019-11-17: qty 1

## 2019-11-17 NOTE — Care Management Obs Status (Signed)
[facility] NOTIFICATION   Patient Details  Name: Nathaniel [name] MRN: [id number] Date of Birth: 1950-08-08   Medicare Observation Status Notification Given:  No(admitted obs less 24 hours)    Beverly Sessions, RN 11/17/2019, 11:32 AM

## 2019-11-17 NOTE — Progress Notes (Signed)
[facility]    Discharge Summary  Patient ID:  Nathaniel [name] MRN: [id number] DOB/AGE: June 09, 1951 69 y.o.  Admit date: 11/16/2019 Discharge date: 11/17/2019 Date of Surgery: 11/16/2019 Surgeon: Surgeon(s): Algernon Huxley, MD  Admission Diagnosis: Ischemia of left lower extremity [I99.8]  Discharge Diagnoses:  Ischemia of left lower extremity [I99.8]  Secondary Diagnoses: Past Medical History:  Diagnosis Date  . Cataract   . Chicken pox   . GERD (gastroesophageal reflux disease)   . Hyperlipidemia   . PAD (peripheral artery disease) ([place])   . Pre-diabetes   . Seizures ([place])    None since age 29   Procedure(s): LOWER EXTREMITY ANGIOGRAPHY (LEFT)  Discharged Condition: good  HPI / Hospital Course:  Patient is a 69 y.o.male with recurrent short distance claudication and some early rest pain of the left leg after a previous intervention in 2018. The patient has noninvasive study showing thrombosis of his previous intervention. The patient is brought in for angiography for further evaluation and potential treatment.  Risks and benefits are discussed and informed consent is obtained.   On 11/16/19, the patient underwent  1. Ultrasound guidance for vascular access right femoral artery 2. Catheter placement into left common femoral artery from right femoral approach 3. Aortogram and selective left lower extremity angiogram 4.  Percutaneous transluminal angioplasty of the right external iliac artery with 7 mm diameter by 8 cm length angioplasty 5.  Mechanical thrombectomy with the Bard roto-Rx device to the left SFA and popliteal arteries             6.  Viabahn stent placement x2 to the left SFA and popliteal artery with a 6 mm diameter by 25 cm length and a 6 mm diameter by 15 cm length stents             7.  Percutaneous transluminal angioplasty of the left posterior tibial artery with 2.5  mm diameter by 22 cm length angioplasty with 8. StarClose closure device right femoral artery  He patient tolerated the procedure well was transferred from the radiologist made to the surgical floor without issue.  Patient's night of procedure was unremarkable.  Patient was treated with Aggrastat which was stopped 2 days ago and the following day.  During the patient's brief inpatient stay, his diet was advanced, he was urinating independently, his pain was controlled with the use of p.o. pain medication and he was ambulating at baseline.  Upon discharge, the patient was afebrile with stable vital signs.  Procedure(s): LOWER EXTREMITY ANGIOGRAPHY  Physical exam:  Alert and oriented x3, no acute distress Cardiovascular: Regular rate and rhythm Pulmonary: Clear to auscultation bilaterally Abdomen: Soft, nontender, nondistended, positive bowel sounds Right groin:  Access site: Clean dry and intact Left lower extremity: Thigh soft.  Calf soft.  Extremities warm distally in toes.  There is no acute vascular episode of this time.  Capillary refill.  Labs: As below  Complications: None  Consults: None  Significant Diagnostic Studies: CBC Lab Results  Component Value Date   WBC 13.7 (H) 11/17/2019   HGB 14.3 11/17/2019   HCT 41.9 11/17/2019   MCV 92.3 11/17/2019   PLT 250 11/17/2019   BMET    Component Value Date/Time   NA 138 11/17/2019 0525   K 3.9 11/17/2019 0525   CL 106 11/17/2019 0525   CO2 26 11/17/2019 0525   GLUCOSE 102 (H) 11/17/2019 0525   BUN 13 11/17/2019 0525   CREATININE 0.99 11/17/2019 0525  CALCIUM 8.7 (L) 11/17/2019 0525   GFRNONAA >60 11/17/2019 0525   GFRAA >60 11/17/2019 0525   COAG No results found for: INR, PROTIME  Disposition:  Discharge to :Home  Allergies as of 11/17/2019      Reactions   Prozac [fluoxetine Hcl] Other (See Comments)   Extreme depression   Statins       Medication List    TAKE these medications   aspirin 81  MG EC tablet Take 1 tablet (81 mg total) by mouth daily. Start taking on: November 18, 2019   clopidogrel 75 MG tablet Commonly known as: PLAVIX Take 1 tablet (75 mg total) by mouth daily. Start taking on: November 18, 2019   diphenhydrAMINE 25 MG tablet Commonly known as: BENADRYL Take 25 mg by mouth daily as needed for itching.   esomeprazole 20 MG capsule Commonly known as: NEXIUM Take 20 mg by mouth daily as needed (heartburn).   ibuprofen 200 MG tablet Commonly known as: ADVIL Take 200-400 mg by mouth every 6 (six) hours as needed for fever or mild pain.   multivitamin with minerals tablet Take 1 tablet by mouth daily.      Verbal and written Discharge instructions given to the patient. Wound care per Discharge AVS Follow-up Information    Dew, Erskine Squibb, MD Follow up in 1 month(s).   Specialties: Vascular Surgery, Radiology, Interventional Cardiology Why: Can see Dew or Arna Medici. Will need ABI.  Contact information: [place] [address] [id number]          Signed: Sela Hua, PA-C  11/17/2019, 10:39 AM

## 2019-11-17 NOTE — Progress Notes (Signed)
Pt discharged per MD order. IV removed. Discharged instructions reviewed with pt and his wife. Pt verbalized understanding with all questions answered to his satisfaction. Pt taken to car in wheelchair by staff.

## 2019-11-17 NOTE — Progress Notes (Signed)
IVF paused for change of bag, unable to flush after several attempts at manipulation; IV team consulted to assist in declotting. Barbaraann Faster, RN 4:10 AM 11/17/2019

## 2019-11-25 ENCOUNTER — Telehealth (INDEPENDENT_AMBULATORY_CARE_PROVIDER_SITE_OTHER): Payer: Self-pay

## 2019-11-25 NOTE — Telephone Encounter (Signed)
Yes both of these things would help the swelling.  When he isn't walking he should be elevating his foot.  If he has compression socks that would help as well

## 2019-11-25 NOTE — Telephone Encounter (Signed)
Patient has been made aware with medical advice and verbalized understanding 

## 2019-12-14 ENCOUNTER — Encounter (INDEPENDENT_AMBULATORY_CARE_PROVIDER_SITE_OTHER): Payer: Self-pay | Admitting: Nurse Practitioner

## 2019-12-14 ENCOUNTER — Ambulatory Visit (INDEPENDENT_AMBULATORY_CARE_PROVIDER_SITE_OTHER): Payer: Medicare Other

## 2019-12-14 ENCOUNTER — Encounter (INDEPENDENT_AMBULATORY_CARE_PROVIDER_SITE_OTHER): Payer: Self-pay

## 2019-12-14 ENCOUNTER — Ambulatory Visit (INDEPENDENT_AMBULATORY_CARE_PROVIDER_SITE_OTHER): Payer: Medicare Other | Admitting: Nurse Practitioner

## 2019-12-14 ENCOUNTER — Other Ambulatory Visit (INDEPENDENT_AMBULATORY_CARE_PROVIDER_SITE_OTHER): Payer: Self-pay | Admitting: Vascular Surgery

## 2019-12-14 ENCOUNTER — Other Ambulatory Visit: Payer: Self-pay

## 2019-12-14 VITALS — BP 152/82 | HR 78 | Ht 76.0 in | Wt 202.0 lb

## 2019-12-14 DIAGNOSIS — R208 Other disturbances of skin sensation: Secondary | ICD-10-CM | POA: Diagnosis not present

## 2019-12-14 DIAGNOSIS — R200 Anesthesia of skin: Secondary | ICD-10-CM

## 2019-12-14 DIAGNOSIS — I70212 Atherosclerosis of native arteries of extremities with intermittent claudication, left leg: Secondary | ICD-10-CM

## 2019-12-14 DIAGNOSIS — Z95820 Peripheral vascular angioplasty status with implants and grafts: Secondary | ICD-10-CM

## 2019-12-14 DIAGNOSIS — Z720 Tobacco use: Secondary | ICD-10-CM | POA: Diagnosis not present

## 2019-12-14 DIAGNOSIS — E785 Hyperlipidemia, unspecified: Secondary | ICD-10-CM

## 2019-12-15 ENCOUNTER — Encounter (INDEPENDENT_AMBULATORY_CARE_PROVIDER_SITE_OTHER): Payer: Self-pay | Admitting: Nurse Practitioner

## 2019-12-15 DIAGNOSIS — M5136 Other intervertebral disc degeneration, lumbar region: Secondary | ICD-10-CM | POA: Diagnosis not present

## 2019-12-15 DIAGNOSIS — M5416 Radiculopathy, lumbar region: Secondary | ICD-10-CM | POA: Diagnosis not present

## 2019-12-15 DIAGNOSIS — M9903 Segmental and somatic dysfunction of lumbar region: Secondary | ICD-10-CM | POA: Diagnosis not present

## 2019-12-15 DIAGNOSIS — M9905 Segmental and somatic dysfunction of pelvic region: Secondary | ICD-10-CM | POA: Diagnosis not present

## 2019-12-15 NOTE — Progress Notes (Signed)
Subjective:    Patient ID: Nathaniel [name], male    DOB: 1950-08-19, 69 y.o.   MRN: [id number] Chief Complaint  Patient presents with  . Follow-up    U/S Follow up    The patient returns to the office for followup and review of the noninvasive studies. There have been no interval changes in lower extremity symptoms. No interval shortening of the patient's claudication distance or development of rest pain symptoms. No new ulcers or wounds have occurred since the last visit.  The patient states that he is having some lasting numbness from his previous ischemic event.  The patient also notes that he has stop smoking at this time.  There have been no significant changes to the patient's overall health care.  The patient denies amaurosis fugax or recent TIA symptoms. There are no recent neurological changes noted. The patient denies history of DVT, PE or superficial thrombophlebitis. The patient denies recent episodes of angina or shortness of breath.   ABI Rt=1.08 and Lt=1.12  (previous ABI's Rt=1.23 and Lt=0.53) Duplex ultrasound of the bilateral tibial arteries reveals triphasic waveforms with good toe waveforms bilaterally   Review of Systems  Neurological: Positive for numbness.  All other systems reviewed and are negative.      Objective:   Physical Exam Vitals reviewed.  HENT:     Head: Normocephalic.  Cardiovascular:     Rate and Rhythm: Normal rate and regular rhythm.     Pulses: Normal pulses.  Skin:    General: Skin is warm and dry.  Neurological:     Mental Status: He is alert and oriented to person, place, and time.  Psychiatric:        Mood and Affect: Mood normal.        Behavior: Behavior normal.        Thought Content: Thought content normal.        Judgment: Judgment normal.     BP (!) 152/82   Pulse 78   Ht 6\' 4"  (1.93 m)   Wt 202 lb (91.6 kg)   BMI 24.59 kg/m   Past Medical History:  Diagnosis Date  . Cataract   . Chicken pox   . GERD  (gastroesophageal reflux disease)   . Hyperlipidemia   . PAD (peripheral artery disease) ([place])   . Pre-diabetes   . Seizures ([place])    None since age 12    Social History   Socioeconomic History  . Marital status: Married    Spouse name: Not on file  . Number of children: Not on file  . Years of education: Not on file  . Highest education level: Not on file  Occupational History  . Not on file  Tobacco Use  . Smoking status: Current Every Day Smoker    Packs/day: 1.00    Years: 50.00    Pack years: 50.00    Types: Cigarettes  . Smokeless tobacco: Never Used  Substance and Sexual Activity  . Alcohol use: Yes    Alcohol/week: 0.0 standard drinks    Comment: scotch twice a year. occassionally beer once every 2-3 mths  . Drug use: No  . Sexual activity: Not on file  Other Topics Concern  . Not on file  Social History Narrative   Retired.   Stays busy with household work, Surveyor, mining.   Married for 3 years.   Grown children.   Enjoys reading.    Social Determinants of Health   Financial Resource Strain:   .  Difficulty of Paying Living Expenses:   Food Insecurity:   . Worried About Charity fundraiser in the Last Year:   . Arboriculturist in the Last Year:   Transportation Needs:   . Film/video editor (Medical):   Marland Kitchen Lack of Transportation (Non-Medical):   Physical Activity:   . Days of Exercise per Week:   . Minutes of Exercise per Session:   Stress:   . Feeling of Stress :   Social Connections:   . Frequency of Communication with Friends and Family:   . Frequency of Social Gatherings with Friends and Family:   . Attends Religious Services:   . Active Member of Clubs or Organizations:   . Attends Archivist Meetings:   Marland Kitchen Marital Status:   Intimate Partner Violence:   . Fear of Current or Ex-Partner:   . Emotionally Abused:   Marland Kitchen Physically Abused:   . Sexually Abused:     Past Surgical History:  Procedure Laterality Date  . BACK SURGERY     . LAMINECTOMY  1987  . LOWER EXTREMITY ANGIOGRAPHY Left 05/02/2017   Procedure: Lower Extremity Angiography;  Surgeon: Algernon Huxley, MD;  Location: [facility];  Service: Cardiovascular;  Laterality: Left;  . LOWER EXTREMITY ANGIOGRAPHY Left 11/16/2019   Procedure: LOWER EXTREMITY ANGIOGRAPHY;  Surgeon: Algernon Huxley, MD;  Location: [facility];  Service: Cardiovascular;  Laterality: Left;  . TONSILLECTOMY      Family History  Problem Relation Age of Onset  . Heart disease Mother        Arrthymia   . Stroke Father        Deceased  . Dementia Father   . Heart disease Brother        Myocardial infarction  . Colon cancer Neg Hx     Allergies  Allergen Reactions  . Prozac [Fluoxetine Hcl] Other (See Comments)    Extreme depression  . Statins        Assessment & Plan:   1. Atherosclerosis of native artery of left lower extremity with intermittent claudication (HCC)  Recommend:  The patient has evidence of atherosclerosis of the lower extremities with claudication.  The patient does not voice lifestyle limiting changes at this point in time.  Noninvasive studies do not suggest clinically significant change.  No invasive studies, angiography or surgery at this time The patient should continue walking and begin a more formal exercise program.  The patient should continue antiplatelet therapy and aggressive treatment of the lipid abnormalities  No changes in the patient's medications at this time  The patient should continue wearing graduated compression socks 10-15 mmHg strength to control the mild edema.    Patient will return in 3 months with noninvasive studies. 2. Tobacco abuse Patient has stop smoking.  Patient is encouraged in his smoking abstinence.   3. Numbness of left foot The patient continues to have some numbness of his left great toe.  This is likely due to the ischemia of his left foot.  Over time this may correct or it could be lasting  damage to the nerve sensation.  At this time we will continue to monitor the patient's numbness.  If it begins to get intolerable for the patient we will discuss possible pharmacological options however at this time the patient wishes to continue with conservative therapies.  4. Hyperlipidemia, unspecified hyperlipidemia type Continue statin as ordered and reviewed, no changes at this time    Current  Outpatient Medications on File Prior to Visit  Medication Sig Dispense Refill  . aspirin EC 81 MG EC tablet Take 1 tablet (81 mg total) by mouth daily. 90 tablet 3  . clopidogrel (PLAVIX) 75 MG tablet Take 1 tablet (75 mg total) by mouth daily. 90 tablet 3  . diphenhydrAMINE (BENADRYL) 25 MG tablet Take 25 mg by mouth daily as needed for itching.     . esomeprazole (NEXIUM) 20 MG capsule Take 20 mg by mouth daily as needed (heartburn).     Marland Kitchen ibuprofen (ADVIL) 200 MG tablet Take 200-400 mg by mouth every 6 (six) hours as needed for fever or mild pain.     . Multiple Vitamins-Minerals (MULTIVITAMIN WITH MINERALS) tablet Take 1 tablet by mouth daily.     No current facility-administered medications on file prior to visit.    There are no Patient Instructions on file for this visit. No follow-ups on file.   Kris Hartmann, NP

## 2020-02-04 ENCOUNTER — Other Ambulatory Visit: Payer: Self-pay | Admitting: Primary Care

## 2020-02-04 DIAGNOSIS — E785 Hyperlipidemia, unspecified: Secondary | ICD-10-CM

## 2020-02-04 DIAGNOSIS — Z125 Encounter for screening for malignant neoplasm of prostate: Secondary | ICD-10-CM

## 2020-02-04 DIAGNOSIS — R7303 Prediabetes: Secondary | ICD-10-CM

## 2020-02-07 ENCOUNTER — Other Ambulatory Visit: Payer: Self-pay | Admitting: Primary Care

## 2020-02-09 DIAGNOSIS — M9905 Segmental and somatic dysfunction of pelvic region: Secondary | ICD-10-CM | POA: Diagnosis not present

## 2020-02-09 DIAGNOSIS — M9903 Segmental and somatic dysfunction of lumbar region: Secondary | ICD-10-CM | POA: Diagnosis not present

## 2020-02-09 DIAGNOSIS — M5416 Radiculopathy, lumbar region: Secondary | ICD-10-CM | POA: Diagnosis not present

## 2020-02-09 DIAGNOSIS — M5136 Other intervertebral disc degeneration, lumbar region: Secondary | ICD-10-CM | POA: Diagnosis not present

## 2020-02-23 ENCOUNTER — Ambulatory Visit (INDEPENDENT_AMBULATORY_CARE_PROVIDER_SITE_OTHER): Payer: Medicare Other

## 2020-02-23 ENCOUNTER — Other Ambulatory Visit: Payer: Self-pay

## 2020-02-23 DIAGNOSIS — Z0000 Encounter for general adult medical examination without abnormal findings: Secondary | ICD-10-CM | POA: Diagnosis not present

## 2020-02-23 NOTE — Patient Instructions (Signed)
Nathaniel [name] , Thank you for taking time to come for your Medicare Wellness Visit. I appreciate your ongoing commitment to your health goals. Please review the following plan we discussed and let me know if I can assist you in the future.   Screening recommendations/referrals: Colonoscopy: declined Recommended yearly ophthalmology/optometry visit for glaucoma screening and checkup Recommended yearly dental visit for hygiene and checkup  Vaccinations: Influenza vaccine: declined Pneumococcal vaccine: declined Tdap vaccine: Up to date, completed 07/23/2010, due 07/2020 Shingles vaccine: declined   Covid-19: declined  Advanced directives: Advance directive discussed with you today. Even though you declined this today please call our office should you change your mind and we can give you the proper paperwork for you to fill out.   Conditions/risks identified: hyperlipidemia  Next appointment: Follow up in one year for your annual wellness visit.   Preventive Care 72 Years and Older, Male Preventive care refers to lifestyle choices and visits with your health care provider that can promote health and wellness. What does preventive care include?  A yearly physical exam. This is also called an annual well check.  Dental exams once or twice a year.  Routine eye exams. Ask your health care provider how often you should have your eyes checked.  Personal lifestyle choices, including:  Daily care of your teeth and gums.  Regular physical activity.  Eating a healthy diet.  Avoiding tobacco and drug use.  Limiting alcohol use.  Practicing safe sex.  Taking low doses of aspirin every day.  Taking vitamin and mineral supplements as recommended by your health care provider. What happens during an annual well check? The services and screenings done by your health care provider during your annual well check will depend on your age, overall health, lifestyle risk factors, and family  history of disease. Counseling  Your health care provider may ask you questions about your:  Alcohol use.  Tobacco use.  Drug use.  Emotional well-being.  Home and relationship well-being.  Sexual activity.  Eating habits.  History of falls.  Memory and ability to understand (cognition).  Work and work Statistician. Screening  You may have the following tests or measurements:  Height, weight, and BMI.  Blood pressure.  Lipid and cholesterol levels. These may be checked every 5 years, or more frequently if you are over 90 years old.  Skin check.  Lung cancer screening. You may have this screening every year starting at age 42 if you have a 30-pack-year history of smoking and currently smoke or have quit within the past 15 years.  Fecal occult blood test (FOBT) of the stool. You may have this test every year starting at age 58.  Flexible sigmoidoscopy or colonoscopy. You may have a sigmoidoscopy every 5 years or a colonoscopy every 10 years starting at age 29.  Prostate cancer screening. Recommendations will vary depending on your family history and other risks.  Hepatitis C blood test.  Hepatitis B blood test.  Sexually transmitted disease (STD) testing.  Diabetes screening. This is done by checking your blood sugar (glucose) after you have not eaten for a while (fasting). You may have this done every 1-3 years.  Abdominal aortic aneurysm (AAA) screening. You may need this if you are a current or former smoker.  Osteoporosis. You may be screened starting at age 27 if you are at high risk. Talk with your health care provider about your test results, treatment options, and if necessary, the need for more tests. Vaccines  Your health  care provider may recommend certain vaccines, such as:  Influenza vaccine. This is recommended every year.  Tetanus, diphtheria, and acellular pertussis (Tdap, Td) vaccine. You may need a Td booster every 10 years.  Zoster vaccine.  You may need this after age 89.  Pneumococcal 13-valent conjugate (PCV13) vaccine. One dose is recommended after age 45.  Pneumococcal polysaccharide (PPSV23) vaccine. One dose is recommended after age 69. Talk to your health care provider about which screenings and vaccines you need and how often you need them. This information is not intended to replace advice given to you by your health care provider. Make sure you discuss any questions you have with your health care provider. Document Released: 08/05/2015 Document Revised: 03/28/2016 Document Reviewed: 05/10/2015 Elsevier Interactive Patient Education  2017 [place] Prevention in the Home Falls can cause injuries. They can happen to people of all ages. There are many things you can do to make your home safe and to help prevent falls. What can I do on the outside of my home?  Regularly fix the edges of walkways and driveways and fix any cracks.  Remove anything that might make you trip as you walk through a door, such as a raised step or threshold.  Trim any bushes or trees on the path to your home.  Use bright outdoor lighting.  Clear any walking paths of anything that might make someone trip, such as rocks or tools.  Regularly check to see if handrails are loose or broken. Make sure that both sides of any steps have handrails.  Any raised decks and porches should have guardrails on the edges.  Have any leaves, snow, or ice cleared regularly.  Use sand or salt on walking paths during winter.  Clean up any spills in your garage right away. This includes oil or grease spills. What can I do in the bathroom?  Use night lights.  Install grab bars by the toilet and in the tub and shower. Do not use towel bars as grab bars.  Use non-skid mats or decals in the tub or shower.  If you need to sit down in the shower, use a plastic, non-slip stool.  Keep the floor dry. Clean up any water that spills on the floor as soon  as it happens.  Remove soap buildup in the tub or shower regularly.  Attach bath mats securely with double-sided non-slip rug tape.  Do not have throw rugs and other things on the floor that can make you trip. What can I do in the bedroom?  Use night lights.  Make sure that you have a light by your bed that is easy to reach.  Do not use any sheets or blankets that are too big for your bed. They should not hang down onto the floor.  Have a firm chair that has side arms. You can use this for support while you get dressed.  Do not have throw rugs and other things on the floor that can make you trip. What can I do in the kitchen?  Clean up any spills right away.  Avoid walking on wet floors.  Keep items that you use a lot in easy-to-reach places.  If you need to reach something above you, use a strong step stool that has a grab bar.  Keep electrical cords out of the way.  Do not use floor polish or wax that makes floors slippery. If you must use wax, use non-skid floor wax.  Do not have  throw rugs and other things on the floor that can make you trip. What can I do with my stairs?  Do not leave any items on the stairs.  Make sure that there are handrails on both sides of the stairs and use them. Fix handrails that are broken or loose. Make sure that handrails are as long as the stairways.  Check any carpeting to make sure that it is firmly attached to the stairs. Fix any carpet that is loose or worn.  Avoid having throw rugs at the top or bottom of the stairs. If you do have throw rugs, attach them to the floor with carpet tape.  Make sure that you have a light switch at the top of the stairs and the bottom of the stairs. If you do not have them, ask someone to add them for you. What else can I do to help prevent falls?  Wear shoes that:  Do not have high heels.  Have rubber bottoms.  Are comfortable and fit you well.  Are closed at the toe. Do not wear sandals.  If  you use a stepladder:  Make sure that it is fully opened. Do not climb a closed stepladder.  Make sure that both sides of the stepladder are locked into place.  Ask someone to hold it for you, if possible.  Clearly mark and make sure that you can see:  Any grab bars or handrails.  First and last steps.  Where the edge of each step is.  Use tools that help you move around (mobility aids) if they are needed. These include:  Canes.  Walkers.  Scooters.  Crutches.  Turn on the lights when you go into a dark area. Replace any light bulbs as soon as they burn out.  Set up your furniture so you have a clear path. Avoid moving your furniture around.  If any of your floors are uneven, fix them.  If there are any pets around you, be aware of where they are.  Review your medicines with your doctor. Some medicines can make you feel dizzy. This can increase your chance of falling. Ask your doctor what other things that you can do to help prevent falls. This information is not intended to replace advice given to you by your health care provider. Make sure you discuss any questions you have with your health care provider. Document Released: 05/05/2009 Document Revised: 12/15/2015 Document Reviewed: 08/13/2014 Elsevier Interactive Patient Education  2017 [employer].

## 2020-02-23 NOTE — Progress Notes (Signed)
Subjective:   Nathaniel [name] is a 69 y.o. male who presents for Medicare Annual/Subsequent preventive examination.  Review of Systems: N/A     I connected with the patient today by telephone and verified that I am speaking with the correct person using two identifiers. Location patient: home Location nurse: work Persons participating in the virtual visit: patient, Marine scientist.   I discussed the limitations, risks, security and privacy concerns of performing an evaluation and management service by telephone and the availability of in person appointments. I also discussed with the patient that there may be a patient responsible charge related to this service. The patient expressed understanding and verbally consented to this telephonic visit.    Interactive audio and video telecommunications were attempted between this nurse and patient, however failed, due to patient having technical difficulties OR patient did not have access to video capability.  We continued and completed visit with audio only.     Cardiac Risk Factors include: advanced age (>47men, >31 women);dyslipidemia;male gender     Objective:    Today's Vitals   02/23/20 0858  PainSc: 0-No pain   There is no height or weight on file to calculate BMI.  Advanced Directives 02/23/2020 11/16/2019 02/10/2018 05/02/2017 04/26/2017 01/22/2017 03/09/2015  Does Patient Have a Medical Advance Directive? No No No No No No No  Would patient like information on creating a medical advance directive? No - Patient declined Yes (MAU/Ambulatory/Procedural Areas - Information given) No - Patient declined Yes (Inpatient - patient requests chaplain consult to create a medical advance directive);No - Patient declined - - -    Current Medications (verified) Outpatient Encounter Medications as of 02/23/2020  Medication Sig  . aspirin EC 81 MG EC tablet Take 1 tablet (81 mg total) by mouth daily.  . diphenhydrAMINE (BENADRYL) 25 MG tablet Take 25 mg by  mouth daily as needed for itching.   . esomeprazole (NEXIUM) 20 MG capsule Take 20 mg by mouth daily as needed (heartburn).   Marland Kitchen ibuprofen (ADVIL) 200 MG tablet Take 200-400 mg by mouth every 6 (six) hours as needed for fever or mild pain.   . Multiple Vitamins-Minerals (MULTIVITAMIN WITH MINERALS) tablet Take 1 tablet by mouth daily.  . clopidogrel (PLAVIX) 75 MG tablet Take 1 tablet (75 mg total) by mouth daily. (Patient not taking: Reported on 02/23/2020)   No facility-administered encounter medications on file as of 02/23/2020.    Allergies (verified) Prozac [fluoxetine hcl] and Statins   History: Past Medical History:  Diagnosis Date  . Cataract   . Chicken pox   . GERD (gastroesophageal reflux disease)   . Hyperlipidemia   . PAD (peripheral artery disease) ([place])   . Pre-diabetes   . Seizures ([place])    None since age 53   Past Surgical History:  Procedure Laterality Date  . BACK SURGERY    . LAMINECTOMY  1987  . LOWER EXTREMITY ANGIOGRAPHY Left 05/02/2017   Procedure: Lower Extremity Angiography;  Surgeon: Algernon Huxley, MD;  Location: [facility];  Service: Cardiovascular;  Laterality: Left;  . LOWER EXTREMITY ANGIOGRAPHY Left 11/16/2019   Procedure: LOWER EXTREMITY ANGIOGRAPHY;  Surgeon: Algernon Huxley, MD;  Location: [facility];  Service: Cardiovascular;  Laterality: Left;  . TONSILLECTOMY     Family History  Problem Relation Age of Onset  . Heart disease Mother        Arrthymia   . Stroke Father        Deceased  .  Dementia Father   . Heart disease Brother        Myocardial infarction  . Colon cancer Neg Hx    Social History   Socioeconomic History  . Marital status: Married    Spouse name: Not on file  . Number of children: Not on file  . Years of education: Not on file  . Highest education level: Not on file  Occupational History  . Not on file  Tobacco Use  . Smoking status: Former Smoker    Packs/day: 1.00    Years: 50.00    Pack years:  50.00    Types: Cigarettes    Quit date: 11/21/2019    Years since quitting: 0.2  . Smokeless tobacco: Never Used  Vaping Use  . Vaping Use: Never used  Substance and Sexual Activity  . Alcohol use: Not Currently    Alcohol/week: 0.0 standard drinks  . Drug use: No  . Sexual activity: Not on file  Other Topics Concern  . Not on file  Social History Narrative   Retired.   Stays busy with household work, Surveyor, mining.   Married for 3 years.   Grown children.   Enjoys reading.    Social Determinants of Health   Financial Resource Strain: Low Risk   . Difficulty of Paying Living Expenses: Not hard at all  Food Insecurity: No Food Insecurity  . Worried About Charity fundraiser in the Last Year: Never true  . Ran Out of Food in the Last Year: Never true  Transportation Needs: No Transportation Needs  . Lack of Transportation (Medical): No  . Lack of Transportation (Non-Medical): No  Physical Activity: Inactive  . Days of Exercise per Week: 0 days  . Minutes of Exercise per Session: 0 min  Stress: No Stress Concern Present  . Feeling of Stress : Not at all  Social Connections:   . Frequency of Communication with Friends and Family:   . Frequency of Social Gatherings with Friends and Family:   . Attends Religious Services:   . Active Member of Clubs or Organizations:   . Attends Archivist Meetings:   Marland Kitchen Marital Status:     Tobacco Counseling Counseling given: Not Answered   Clinical Intake:  Pre-visit preparation completed: Yes  Pain : No/denies pain Pain Score: 0-No pain     Nutritional Risks: None Diabetes: No  How often do you need to have someone help you when you read instructions, pamphlets, or other written materials from your doctor or pharmacy?: 1 - Never What is the last grade level you completed in school?: 4 years technical school  Diabetic: No Nutrition Risk Assessment:  Has the patient had any N/V/D within the last 2 months?  No    Does the patient have any non-healing wounds?  No  Has the patient had any unintentional weight loss or weight gain?  No   Diabetes:  Is the patient diabetic?  No  If diabetic, was a CBG obtained today?  N/A Did the patient bring in their glucometer from home?  N/A How often do you monitor your CBG's? N/A.   Financial Strains and Diabetes Management:  Are you having any financial strains with the device, your supplies or your medication? N/A.  Does the patient want to be seen by Chronic Care Management for management of their diabetes?  N/A Would the patient like to be referred to a Nutritionist or for Diabetic Management?  N/A     Interpreter Needed?:  No  Information entered by :: CJohnson, LPN   Activities of Daily Living In your present state of health, do you have any difficulty performing the following activities: 02/23/2020 11/16/2019  Hearing? Tempie Donning  Comment wears hearing aids -  Vision? N N  Difficulty concentrating or making decisions? Y N  Comment has short term memory issues -  Walking or climbing stairs? N N  Dressing or bathing? N N  Doing errands, shopping? N N  Preparing Food and eating ? N -  Using the Toilet? N -  In the past six months, have you accidently leaked urine? N -  Do you have problems with loss of bowel control? N -  Managing your Medications? N -  Managing your Finances? N -  Housekeeping or managing your Housekeeping? N -  Some recent data might be hidden    Patient Care Team: Pleas Koch, NP as PCP - General (Nurse Practitioner)  Indicate any recent Medical Services you may have received from other than [facility] providers in the past year (date may be approximate).     Assessment:   This is a routine wellness examination for [facility].  Hearing/Vision screen  Hearing Screening   125Hz  250Hz  500Hz  1000Hz  2000Hz  3000Hz  4000Hz  6000Hz  8000Hz   Right ear:           Left ear:           Vision Screening Comments: Patient gets annual eye  exams   Dietary issues and exercise activities discussed: Current Exercise Habits: The patient does not participate in regular exercise at present, Exercise limited by: None identified  Goals    . Increase physical activity     Starting 02/10/2018, I will continue to walk at least 3/4 mile daily.     . Patient Stated     02/23/2020, I will maintain and continue medications as prescribed.       Depression Screen PHQ 2/9 Scores 02/23/2020 02/20/2019 02/10/2018 01/16/2016 01/17/2015  PHQ - 2 Score 0 0 1 0 4  PHQ- 9 Score 0 - 3 - 7    Fall Risk Fall Risk  02/23/2020 06/17/2019 02/20/2019 02/10/2018 06/27/2017  Falls in the past year? 0 0 0 No No  Comment - Emmi Telephone Survey: data to providers prior to load - - Emmi Telephone Survey: data to providers prior to load  Number falls in past yr: 0 - 0 - -  Injury with Fall? 0 - 0 - -  Risk for fall due to : Medication side effect - - - -  Follow up Falls evaluation completed;Falls prevention discussed - - - -    Any stairs in or around the home? Yes  If so, are there any without handrails? No  Home free of loose throw rugs in walkways, pet beds, electrical cords, etc? Yes  Adequate lighting in your home to reduce risk of falls? Yes   ASSISTIVE DEVICES UTILIZED TO PREVENT FALLS:  Life alert? No  Use of a cane, walker or w/c? No  Grab bars in the bathroom? No  Shower chair or bench in shower? No  Elevated toilet seat or a handicapped toilet? No   TIMED UP AND GO:  Was the test performed? N/A, telephonic visit .    Cognitive Function: MMSE - Mini Mental State Exam 02/23/2020 02/10/2018  Orientation to time 5 5  Orientation to Place 5 5  Registration 3 3  Attention/ Calculation 5 0  Recall 3 3  Language- name 2 objects -  0  Language- repeat 1 1  Language- follow 3 step command - 3  Language- read & follow direction - 0  Write a sentence - 0  Copy design - 0  Total score - 20  Mini Cog  Mini-Cog screen was completed. Maximum score  is 22. A value of 0 denotes this part of the MMSE was not completed or the patient failed this part of the Mini-Cog screening.       Immunizations Immunization History  Administered Date(s) Administered  . Influenza-Unspecified 05/23/2018    TDAP status: Up to date Flu Vaccine status: Declined, Education has been provided regarding the importance of this vaccine but patient still declined. Advised may receive this vaccine at local pharmacy or Health Dept. Aware to provide a copy of the vaccination record if obtained from local pharmacy or Health Dept. Verbalized acceptance and understanding. Pneumococcal vaccine status: Declined,  Education has been provided regarding the importance of this vaccine but patient still declined. Advised may receive this vaccine at local pharmacy or Health Dept. Aware to provide a copy of the vaccination record if obtained from local pharmacy or Health Dept. Verbalized acceptance and understanding.  Covid-19 vaccine status: Declined, Education has been provided regarding the importance of this vaccine but patient still declined. Advised may receive this vaccine at local pharmacy or Health Dept.or vaccine clinic. Aware to provide a copy of the vaccination record if obtained from local pharmacy or Health Dept. Verbalized acceptance and understanding.  Qualifies for Shingles Vaccine? Yes   Zostavax completed No   Shingrix Completed?: No.    Education has been provided regarding the importance of this vaccine. Patient has been advised to call insurance company to determine out of pocket expense if they have not yet received this vaccine. Advised may also receive vaccine at local pharmacy or Health Dept. Verbalized acceptance and understanding.  Screening Tests Health Maintenance  Topic Date Due  . INFLUENZA VACCINE  10/20/2020 (Originally 02/21/2020)  . COLONOSCOPY  02/22/2021 (Originally 03/22/2016)  . COVID-19 Vaccine (1) 03/10/2021 (Originally 01/26/1963)  . PNA vac  Low Risk Adult (1 of 2 - PCV13) 02/10/2049 (Originally 01/26/2014)  . TETANUS/TDAP  07/23/2020  . Hepatitis C Screening  Completed    Health Maintenance  There are no preventive care reminders to display for this patient.  Colorectal cancer screening: declined  Lung Cancer Screening: (Low Dose CT Chest recommended if Age 95-80 years, 30 pack-year currently smoking OR have quit w/in 15years.) does qualify.   Lung Cancer Screening Referral: Patient missed recent appointment. He will call and reschedule to have this done.   Additional Screening:  Hepatitis C Screening: does qualify; Completed 02/10/2018  Vision Screening: Recommended annual ophthalmology exams for early detection of glaucoma and other disorders of the eye. Is the patient up to date with their annual eye exam?  Yes  Who is the provider or what is the name of the office in which the patient attends annual eye exams? Dr. Sol Blazing  If pt is not established with a provider, would they like to be referred to a provider to establish care? No .   Dental Screening: Recommended annual dental exams for proper oral hygiene  Community Resource Referral / Chronic Care Management: CRR required this visit?  No   CCM required this visit?  No      Plan:     I have personally reviewed and noted the following in the patient's chart:   . Medical and social history . Use of alcohol,  tobacco or illicit drugs  . Current medications and supplements . Functional ability and status . Nutritional status . Physical activity . Advanced directives . List of other physicians . Hospitalizations, surgeries, and ER visits in previous 12 months . Vitals . Screenings to include cognitive, depression, and falls . Referrals and appointments  In addition, I have reviewed and discussed with patient certain preventive protocols, quality metrics, and best practice recommendations. A written personalized care plan for preventive services as well as  general preventive health recommendations were provided to patient.   Due to this being a telephonic visit, the after visit summary with patients personalized plan was offered to patient via mail or my-chart.  Patient preferred to pick up at office at next visit.   Andrez Grime, LPN   8/0/0634

## 2020-02-23 NOTE — Progress Notes (Signed)
PCP notes:  Health Maintenance: Declined all vaccines Colonoscopy- declined CT chest- missed appointment. Patient will call and have this rescheduled   Abnormal Screenings: none   Patient concerns: Numbness on the surface of the skin on his left leg and foot.  Fatigue all the time. Takes 2-3 naps everyday and feels exhausted afterwards.   Nurse concerns: none   Next PCP appt.: 03/09/2020 @ 9:40 am

## 2020-02-26 ENCOUNTER — Telehealth: Payer: Self-pay | Admitting: *Deleted

## 2020-02-26 ENCOUNTER — Encounter: Payer: Medicare Other | Admitting: Primary Care

## 2020-02-26 NOTE — Telephone Encounter (Signed)
Patient contacted me to assist with lung cancer screening scan. Confirmed that patient is within the age range of 55-77, and asymptomatic, (no signs or symptoms of lung cancer). Patient denies illness that would prevent curative treatment for lung cancer if found. Verified smoking history, (former, quit 11/2019, 55 pack year). The shared decision making visit was done 01/19/15. Patient is agreeable for CT scan being scheduled.

## 2020-02-29 NOTE — Telephone Encounter (Signed)
Thanks so much Shawn, I appreciate the help.

## 2020-03-04 ENCOUNTER — Other Ambulatory Visit: Payer: Self-pay

## 2020-03-04 ENCOUNTER — Ambulatory Visit
Admission: RE | Admit: 2020-03-04 | Discharge: 2020-03-04 | Disposition: A | Discharge status: Home / Self Care | Payer: Medicare Other | Source: Ambulatory Visit | Attending: Acute Care | Admitting: Acute Care

## 2020-03-04 DIAGNOSIS — Z122 Encounter for screening for malignant neoplasm of respiratory organs: Secondary | ICD-10-CM | POA: Insufficient documentation

## 2020-03-04 DIAGNOSIS — F17200 Nicotine dependence, unspecified, uncomplicated: Secondary | ICD-10-CM | POA: Diagnosis not present

## 2020-03-04 DIAGNOSIS — Z87891 Personal history of nicotine dependence: Secondary | ICD-10-CM

## 2020-03-04 DIAGNOSIS — F17210 Nicotine dependence, cigarettes, uncomplicated: Secondary | ICD-10-CM

## 2020-03-07 ENCOUNTER — Other Ambulatory Visit: Payer: Self-pay

## 2020-03-07 ENCOUNTER — Other Ambulatory Visit (INDEPENDENT_AMBULATORY_CARE_PROVIDER_SITE_OTHER): Payer: Medicare Other

## 2020-03-07 DIAGNOSIS — R7303 Prediabetes: Secondary | ICD-10-CM

## 2020-03-07 DIAGNOSIS — E785 Hyperlipidemia, unspecified: Secondary | ICD-10-CM

## 2020-03-07 DIAGNOSIS — Z125 Encounter for screening for malignant neoplasm of prostate: Secondary | ICD-10-CM | POA: Diagnosis not present

## 2020-03-07 LAB — COMPREHENSIVE METABOLIC PANEL
ALT: 20 U/L (ref 0–53)
AST: 18 U/L (ref 0–37)
Albumin: 4 g/dL (ref 3.5–5.2)
Alkaline Phosphatase: 93 U/L (ref 39–117)
BUN: 7 mg/dL (ref 6–23)
CO2: 31 mEq/L (ref 19–32)
Calcium: 9.5 mg/dL (ref 8.4–10.5)
Chloride: 102 mEq/L (ref 96–112)
Creatinine, Ser: 1.15 mg/dL (ref 0.40–1.50)
GFR: 62.67 mL/min (ref >60.00)
Glucose, Bld: 96 mg/dL (ref 70–99)
Potassium: 4.4 mEq/L (ref 3.5–5.1)
Sodium: 142 mEq/L (ref 135–145)
Total Bilirubin: 0.5 mg/dL (ref 0.2–1.2)
Total Protein: 6.5 g/dL (ref 6.0–8.3)

## 2020-03-07 LAB — LIPID PANEL
Cholesterol: 237 mg/dL — ABNORMAL HIGH (ref 0–200)
HDL: 41.3 mg/dL (ref >39.00)
NonHDL: 195.39
Total CHOL/HDL Ratio: 6
Triglycerides: 245 mg/dL — ABNORMAL HIGH (ref 0.0–149.0)
VLDL: 49 mg/dL — ABNORMAL HIGH (ref 0.0–40.0)

## 2020-03-07 LAB — HEMOGLOBIN A1C: Hgb A1c MFr Bld: 5.9 % (ref 4.6–6.5)

## 2020-03-07 LAB — CBC
HCT: 40.4 % (ref 39.0–52.0)
Hemoglobin: 13.6 g/dL (ref 13.0–17.0)
MCHC: 33.7 g/dL (ref 30.0–36.0)
MCV: 92 fl (ref 78.0–100.0)
Platelets: 265 10*3/uL (ref 150.0–400.0)
RBC: 4.39 Mil/uL (ref 4.22–5.81)
RDW: 14.1 % (ref 11.5–15.5)
WBC: 11.4 10*3/uL — ABNORMAL HIGH (ref 4.0–10.5)

## 2020-03-07 LAB — LDL CHOLESTEROL, DIRECT: Direct LDL: 154 mg/dL

## 2020-03-07 LAB — PSA, MEDICARE: PSA: 0.98 ng/ml (ref 0.10–4.00)

## 2020-03-08 DIAGNOSIS — M5416 Radiculopathy, lumbar region: Secondary | ICD-10-CM | POA: Diagnosis not present

## 2020-03-08 DIAGNOSIS — M5136 Other intervertebral disc degeneration, lumbar region: Secondary | ICD-10-CM | POA: Diagnosis not present

## 2020-03-08 DIAGNOSIS — M9905 Segmental and somatic dysfunction of pelvic region: Secondary | ICD-10-CM | POA: Diagnosis not present

## 2020-03-08 DIAGNOSIS — M9903 Segmental and somatic dysfunction of lumbar region: Secondary | ICD-10-CM | POA: Diagnosis not present

## 2020-03-09 ENCOUNTER — Encounter: Payer: Self-pay | Admitting: Primary Care

## 2020-03-09 ENCOUNTER — Ambulatory Visit (INDEPENDENT_AMBULATORY_CARE_PROVIDER_SITE_OTHER): Payer: Medicare Other | Admitting: Primary Care

## 2020-03-09 ENCOUNTER — Other Ambulatory Visit: Payer: Self-pay

## 2020-03-09 VITALS — BP 134/86 | HR 80 | Temp 96.2°F | Ht 76.0 in | Wt 218.8 lb

## 2020-03-09 DIAGNOSIS — J432 Centrilobular emphysema: Secondary | ICD-10-CM | POA: Diagnosis not present

## 2020-03-09 DIAGNOSIS — I6529 Occlusion and stenosis of unspecified carotid artery: Secondary | ICD-10-CM | POA: Diagnosis not present

## 2020-03-09 DIAGNOSIS — E785 Hyperlipidemia, unspecified: Secondary | ICD-10-CM | POA: Diagnosis not present

## 2020-03-09 DIAGNOSIS — I70212 Atherosclerosis of native arteries of extremities with intermittent claudication, left leg: Secondary | ICD-10-CM | POA: Diagnosis not present

## 2020-03-09 DIAGNOSIS — R7303 Prediabetes: Secondary | ICD-10-CM | POA: Diagnosis not present

## 2020-03-09 DIAGNOSIS — K219 Gastro-esophageal reflux disease without esophagitis: Secondary | ICD-10-CM

## 2020-03-09 DIAGNOSIS — Z1211 Encounter for screening for malignant neoplasm of colon: Secondary | ICD-10-CM | POA: Insufficient documentation

## 2020-03-09 DIAGNOSIS — Z720 Tobacco use: Secondary | ICD-10-CM | POA: Diagnosis not present

## 2020-03-09 MED ORDER — EZETIMIBE 10 MG PO TABS: 10.0000 mg | ORAL_TABLET | Freq: Every day | ORAL | 0 refills | Status: DC

## 2020-03-09 MED ORDER — ALBUTEROL SULFATE HFA 108 (90 BASE) MCG/ACT IN AERS: 2.0000 | INHALATION_SPRAY | Freq: Four times a day (QID) | RESPIRATORY_TRACT | 0 refills | Status: AC | PRN

## 2020-03-09 MED ORDER — FLUTICASONE-SALMETEROL 250-50 MCG/DOSE IN AEPB: 1.0000 | INHALATION_SPRAY | Freq: Two times a day (BID) | RESPIRATORY_TRACT | 5 refills | Status: DC

## 2020-03-09 NOTE — Assessment & Plan Note (Signed)
LDL above goal, now off of clopidogrel due to side effects. This leaves only aspirin 81 mg, he cannot tolerate statins.  Rx for Zetia provided. He will update if he has any problems. Need to repeat lipids in 2 months if he's able to tolerate.

## 2020-03-09 NOTE — Assessment & Plan Note (Signed)
Following with vascular services, no longer on clopidogrel due to side effects. Compliant to aspirin. Continue same.   Adding Zetia for lipid control as he cannot tolerate statins.

## 2020-03-09 NOTE — Assessment & Plan Note (Signed)
Chronic and stable. Discussed the importance of a healthy diet and regular exercise in order for weight loss, and to reduce the risk of any potential medical problems.

## 2020-03-09 NOTE — Assessment & Plan Note (Signed)
As evidence from recent CT chest, chronic, seems to have active symptoms.  Lungs clear today.  Rx for Advair and albuterol sent to pharmacy. He will update.

## 2020-03-09 NOTE — Patient Instructions (Addendum)
Start ezetimibe (Zetia) 10 mg once daily for cholesterol.  Please update me in a few weeks.  Please complete the Cologuard Kit as discussed.   Start fluticasone-salmeterol (Adviar) inhaler for emphysema. Inhale 1 puff into the lungs twice daily, everyday.  Shortness of Breath/Wheezing/Cough: Use the albuterol inhaler. Inhale 2 puffs into the lungs every 4 to 6 hours as needed for wheezing, cough, and/or shortness of breath.   You will be contacted regarding your carotid artery ultrasound.  Please let us know if you have not been contacted within two weeks.   It was a pleasure to see you today!   

## 2020-03-09 NOTE — Assessment & Plan Note (Addendum)
He quit smoking in May 2021, commended him on this achievement. Lung cancer screening results reviewed.

## 2020-03-09 NOTE — Assessment & Plan Note (Signed)
Continues to decline colonoscopy, overdue since 2017. He does agree to [employer] which is better than no screening. Order placed.

## 2020-03-09 NOTE — Progress Notes (Signed)
Subjective:    Patient ID: Nathaniel [name], male    DOB: 06-Apr-1951, 69 y.o.   MRN: [id number]  HPI  This visit occurred during the SARS-CoV-2 public health emergency.  Safety protocols were in place, including screening questions prior to the visit, additional usage of staff PPE, and extensive cleaning of exam room while observing appropriate contact time as indicated for disinfecting solutions.   Nathaniel [name] is a 69 year old male who presents today for [place] Part 2.  He spoke with our health advisor last week.  He would like to discuss resuming LABA/ICS treatment due to symptoms of shortness of breath that occur in the morning and during the day. He has significantly had to reduce his physical activity due to shortness of breath. He was initially prescribed Symbicort several years ago, but used this infrequently. Over the last several months he's noticed a reduction in breathing capacity, tires easily, shortness of breath with limited activity. He quit smoking in May 2021. Recent lung cancer screening showed emphysema.   He denies chest pain.Symbicort was too expensive in the past.   Immunizations: -Shingles: Declines -Pneumonia: Declines  -Covid-19: Declines    Colonoscopy: Completed in 2016, due in 2017. Opts for Cologuard. PSA: 0.98 in 2021 Hep C Screen: Negative Lung Cancer Screening: Completed in August 2021  BP Readings from Last 3 Encounters:  03/09/20 134/86  12/14/19 (!) 152/82  11/17/19 134/83     Review of Systems  Constitutional: Positive for fatigue.  Eyes: Negative for visual disturbance.  Respiratory: Positive for shortness of breath.   Cardiovascular: Negative for chest pain.  Gastrointestinal:       Intermittent constipation   Skin: Negative for color change.  Neurological: Negative for headaches.  Psychiatric/Behavioral: The patient is not nervous/anxious.        Past Medical History:  Diagnosis Date   Cataract    Chicken pox    GERD  (gastroesophageal reflux disease)    Hyperlipidemia    PAD (peripheral artery disease) ([facility])    Pre-diabetes    Seizures ([facility])    None since age 69     Social History   Socioeconomic History   Marital status: Married    Spouse name: Not on file   Number of children: Not on file   Years of education: Not on file   Highest education level: Not on file  Occupational History   Not on file  Tobacco Use   Smoking status: Former Smoker    Packs/day: 1.00    Years: 50.00    Pack years: 50.00    Types: Cigarettes    Quit date: 11/21/2019    Years since quitting: 0.2   Smokeless tobacco: Never Used  Vaping Use   Vaping Use: Never used  Substance and Sexual Activity   Alcohol use: Not Currently    Alcohol/week: 0.0 standard drinks   Drug use: No   Sexual activity: Not on file  Other Topics Concern   Not on file  Social History Narrative   Retired.   Stays busy with household work, Surveyor, mining.   Married for 3 years.   Grown children.   Enjoys reading.    Social Determinants of Health   Financial Resource Strain: Low Risk    Difficulty of Paying Living Expenses: Not hard at all  Food Insecurity: No Food Insecurity   Worried About Charity fundraiser in the Last Year: Never true   [place] in the Last Year:  Never true  Transportation Needs: No Transportation Needs   Lack of Transportation (Medical): No   Lack of Transportation (Non-Medical): No  Physical Activity: Inactive   Days of Exercise per Week: 0 days   Minutes of Exercise per Session: 0 min  Stress: No Stress Concern Present   Feeling of Stress : Not at all  Social Connections:    Frequency of Communication with Friends and Family:    Frequency of Social Gatherings with Friends and Family:    Attends Religious Services:    Active Member of Clubs or Organizations:    Attends Music therapist:    Marital Status:   Intimate Partner Violence: Not At Risk    Fear of Current or Ex-Partner: No   Emotionally Abused: No   Physically Abused: No   Sexually Abused: No    Past Surgical History:  Procedure Laterality Date   BACK SURGERY     LAMINECTOMY  1987   LOWER EXTREMITY ANGIOGRAPHY Left 05/02/2017   Procedure: Lower Extremity Angiography;  Surgeon: Algernon Huxley, MD;  Location: [facility];  Service: Cardiovascular;  Laterality: Left;   LOWER EXTREMITY ANGIOGRAPHY Left 11/16/2019   Procedure: LOWER EXTREMITY ANGIOGRAPHY;  Surgeon: Algernon Huxley, MD;  Location: [facility];  Service: Cardiovascular;  Laterality: Left;   TONSILLECTOMY      Family History  Problem Relation Age of Onset   Heart disease Mother        Arrthymia    Stroke Father        Deceased   Dementia Father    Heart disease Brother        Myocardial infarction   Colon cancer Neg Hx     Allergies  Allergen Reactions   Prozac [Fluoxetine Hcl] Other (See Comments)    Extreme depression   Statins     Current Outpatient Medications on File Prior to Visit  Medication Sig Dispense Refill   aspirin EC 81 MG EC tablet Take 1 tablet (81 mg total) by mouth daily. 90 tablet 3   diphenhydrAMINE (BENADRYL) 25 MG tablet Take 25 mg by mouth daily as needed for itching.      esomeprazole (NEXIUM) 20 MG capsule Take 20 mg by mouth daily as needed (heartburn).      ibuprofen (ADVIL) 200 MG tablet Take 200-400 mg by mouth every 6 (six) hours as needed for fever or mild pain.      Multiple Vitamins-Minerals (MULTIVITAMIN WITH MINERALS) tablet Take 1 tablet by mouth daily.     No current facility-administered medications on file prior to visit.    BP 134/86    Pulse 80    Temp (!) 96.2 F (35.7 C) (Temporal)    Ht 6\' 4"  (1.93 m)    Wt 218 lb 12 oz (99.2 kg)    SpO2 97%    BMI 26.63 kg/m  ' Objective:   Physical Exam HENT:     Right Ear: Tympanic membrane and ear canal normal.     Left Ear: Tympanic membrane and ear canal normal.  Eyes:      Pupils: Pupils are equal, round, and reactive to light.  Neck:     Vascular: Carotid bruit present.     Comments: Right carotid artery bruit noted Cardiovascular:     Rate and Rhythm: Normal rate and regular rhythm.  Pulmonary:     Effort: Pulmonary effort is normal.     Breath sounds: Normal breath sounds.  Abdominal:  General: Bowel sounds are normal.     Palpations: Abdomen is soft.     Tenderness: There is no abdominal tenderness.  Musculoskeletal:        General: Normal range of motion.     Cervical back: Neck supple.  Skin:    General: Skin is warm and dry.  Neurological:     Mental Status: He is alert and oriented to person, place, and time.     Cranial Nerves: No cranial nerve deficit.     Deep Tendon Reflexes:     Reflex Scores:      Patellar reflexes are 2+ on the right side and 2+ on the left side. Psychiatric:        Mood and Affect: Mood normal.            Assessment & Plan:

## 2020-03-09 NOTE — Assessment & Plan Note (Signed)
Chronic with >50% blockage to right side. He quit smoking a few months ago, commended him on this.  Repeat carotid US pending. Bruit noted to right side on exam.

## 2020-03-09 NOTE — Assessment & Plan Note (Signed)
Overall well controlled on Nexium, continue same.

## 2020-03-10 ENCOUNTER — Other Ambulatory Visit: Payer: Self-pay | Admitting: *Deleted

## 2020-03-10 DIAGNOSIS — Z87891 Personal history of nicotine dependence: Secondary | ICD-10-CM

## 2020-03-10 DIAGNOSIS — F17210 Nicotine dependence, cigarettes, uncomplicated: Secondary | ICD-10-CM

## 2020-03-10 NOTE — Progress Notes (Signed)

## 2020-03-14 ENCOUNTER — Other Ambulatory Visit (INDEPENDENT_AMBULATORY_CARE_PROVIDER_SITE_OTHER): Payer: Self-pay | Admitting: Nurse Practitioner

## 2020-03-14 DIAGNOSIS — I739 Peripheral vascular disease, unspecified: Secondary | ICD-10-CM

## 2020-03-15 ENCOUNTER — Encounter (INDEPENDENT_AMBULATORY_CARE_PROVIDER_SITE_OTHER): Payer: Self-pay | Admitting: Nurse Practitioner

## 2020-03-15 ENCOUNTER — Ambulatory Visit (INDEPENDENT_AMBULATORY_CARE_PROVIDER_SITE_OTHER): Payer: Medicare Other

## 2020-03-15 ENCOUNTER — Other Ambulatory Visit: Payer: Self-pay

## 2020-03-15 ENCOUNTER — Ambulatory Visit (INDEPENDENT_AMBULATORY_CARE_PROVIDER_SITE_OTHER): Payer: Medicare Other | Admitting: Nurse Practitioner

## 2020-03-15 VITALS — BP 180/96 | HR 84 | Resp 16 | Wt 218.6 lb

## 2020-03-15 DIAGNOSIS — Z720 Tobacco use: Secondary | ICD-10-CM

## 2020-03-15 DIAGNOSIS — I6529 Occlusion and stenosis of unspecified carotid artery: Secondary | ICD-10-CM

## 2020-03-15 DIAGNOSIS — E785 Hyperlipidemia, unspecified: Secondary | ICD-10-CM | POA: Diagnosis not present

## 2020-03-15 DIAGNOSIS — I739 Peripheral vascular disease, unspecified: Secondary | ICD-10-CM | POA: Diagnosis not present

## 2020-03-15 DIAGNOSIS — I70212 Atherosclerosis of native arteries of extremities with intermittent claudication, left leg: Secondary | ICD-10-CM | POA: Diagnosis not present

## 2020-03-15 NOTE — Progress Notes (Signed)
Subjective:    Patient ID: Nathaniel [name], male    DOB: 11/30/1950, 69 y.o.   MRN: [id number] Chief Complaint  Patient presents with  . Follow-up    ultrasound follow up    The patient returns to the office for followup and review of the noninvasive studies. There have been no interval changes in lower extremity symptoms. No interval shortening of the patient's claudication distance or development of rest pain symptoms. No new ulcers or wounds have occurred since the last visit.  The patient does continue to have some numbness in his left great toe however it is improved from the previous office visit.  There have been no significant changes to the patient's overall health care.  The patient denies amaurosis fugax or recent TIA symptoms. There are no recent neurological changes noted. The patient denies history of DVT, PE or superficial thrombophlebitis. The patient denies recent episodes of angina or shortness of breath.   ABI Rt=1.13 and Lt=1.07  (previous ABI's Rt=1.08 and Lt=1.12) Duplex ultrasound of the right lower extremity has triphasic tibial artery waveforms with biphasic/triphasic waveforms in the left lower extremity.  There are good toe waveforms in the bilateral lower extremities.   Review of Systems  Neurological: Positive for numbness.       Objective:   Physical Exam Vitals reviewed.  HENT:     Head: Normocephalic.  Neck:     Vascular: Carotid bruit present.  Cardiovascular:     Rate and Rhythm: Normal rate.     Pulses: Normal pulses.  Pulmonary:     Effort: Pulmonary effort is normal.  Neurological:     Mental Status: He is alert and oriented to person, place, and time.  Psychiatric:        Mood and Affect: Mood normal.        Behavior: Behavior normal.        Thought Content: Thought content normal.        Judgment: Judgment normal.     BP (!) 180/96 (BP Location: Right Arm)   Pulse 84   Resp 16   Wt 218 lb 9.6 oz (99.2 kg)   BMI 26.61  kg/m   Past Medical History:  Diagnosis Date  . Cataract   . Chicken pox   . GERD (gastroesophageal reflux disease)   . Hyperlipidemia   . PAD (peripheral artery disease) ([place])   . Pre-diabetes   . Seizures ([place])    None since age 30    Social History   Socioeconomic History  . Marital status: Married    Spouse name: Not on file  . Number of children: Not on file  . Years of education: Not on file  . Highest education level: Not on file  Occupational History  . Not on file  Tobacco Use  . Smoking status: Former Smoker    Packs/day: 1.00    Years: 50.00    Pack years: 50.00    Types: Cigarettes    Quit date: 11/21/2019    Years since quitting: 0.3  . Smokeless tobacco: Never Used  Vaping Use  . Vaping Use: Never used  Substance and Sexual Activity  . Alcohol use: Not Currently    Alcohol/week: 0.0 standard drinks  . Drug use: No  . Sexual activity: Not on file  Other Topics Concern  . Not on file  Social History Narrative   Retired.   Stays busy with household work, Surveyor, mining.   Married for 3 years.  Grown children.   Enjoys reading.    Social Determinants of Health   Financial Resource Strain: Low Risk   . Difficulty of Paying Living Expenses: Not hard at all  Food Insecurity: No Food Insecurity  . Worried About Charity fundraiser in the Last Year: Never true  . Ran Out of Food in the Last Year: Never true  Transportation Needs: No Transportation Needs  . Lack of Transportation (Medical): No  . Lack of Transportation (Non-Medical): No  Physical Activity: Inactive  . Days of Exercise per Week: 0 days  . Minutes of Exercise per Session: 0 min  Stress: No Stress Concern Present  . Feeling of Stress : Not at all  Social Connections:   . Frequency of Communication with Friends and Family: Not on file  . Frequency of Social Gatherings with Friends and Family: Not on file  . Attends Religious Services: Not on file  . Active Member of Clubs or  Organizations: Not on file  . Attends Archivist Meetings: Not on file  . Marital Status: Not on file  Intimate Partner Violence: Not At Risk  . Fear of Current or Ex-Partner: No  . Emotionally Abused: No  . Physically Abused: No  . Sexually Abused: No    Past Surgical History:  Procedure Laterality Date  . BACK SURGERY    . LAMINECTOMY  1987  . LOWER EXTREMITY ANGIOGRAPHY Left 05/02/2017   Procedure: Lower Extremity Angiography;  Surgeon: Algernon Huxley, MD;  Location: [facility];  Service: Cardiovascular;  Laterality: Left;  . LOWER EXTREMITY ANGIOGRAPHY Left 11/16/2019   Procedure: LOWER EXTREMITY ANGIOGRAPHY;  Surgeon: Algernon Huxley, MD;  Location: [facility];  Service: Cardiovascular;  Laterality: Left;  . TONSILLECTOMY      Family History  Problem Relation Age of Onset  . Heart disease Mother        Arrthymia   . Stroke Father        Deceased  . Dementia Father   . Heart disease Brother        Myocardial infarction  . Colon cancer Neg Hx     Allergies  Allergen Reactions  . Prozac [Fluoxetine Hcl] Other (See Comments)    Extreme depression  . Statins        Assessment & Plan:   1. Atherosclerosis of native artery of left lower extremity with intermittent claudication (HCC)  Recommend:  The patient has evidence of atherosclerosis of the lower extremities with claudication.  The patient does not voice lifestyle limiting changes at this point in time.  Noninvasive studies do not suggest clinically significant change.  No invasive studies, angiography or surgery at this time The patient should continue walking and begin a more formal exercise program.  The patient should continue antiplatelet therapy and aggressive treatment of the lipid abnormalities  No changes in the patient's medications at this time  The patient should continue wearing graduated compression socks 10-15 mmHg strength to control the mild edema.   Patient will  return to the office in 6 months with noninvasive studies.  2. Hyperlipidemia, unspecified hyperlipidemia type Continue statin as ordered and reviewed, no changes at this time   3. Stenosis of carotid artery, unspecified laterality The patient has evidence of noted carotid stenosis however I have not seen a study done recently.  The patient notes that he is supposed to have an upcoming carotid duplex next week.  Discussed with patient that typically carotid stenosis is  monitored until he reaches about 70 to 75% at which point intervention is typically indicated.  Patient will follow up with [place] if there is evidence of significant stenosis.  4. Tobacco abuse Patient continues to abstain from tobacco use.  After this is encouraged.   Current Outpatient Medications on File Prior to Visit  Medication Sig Dispense Refill  . albuterol (VENTOLIN HFA) 108 (90 Base) MCG/ACT inhaler Inhale 2 puffs into the lungs every 6 (six) hours as needed for shortness of breath. 8 g 0  . aspirin EC 81 MG EC tablet Take 1 tablet (81 mg total) by mouth daily. 90 tablet 3  . diphenhydrAMINE (BENADRYL) 25 MG tablet Take 25 mg by mouth daily as needed for itching.     . esomeprazole (NEXIUM) 20 MG capsule Take 20 mg by mouth daily as needed (heartburn).     . ezetimibe (ZETIA) 10 MG tablet Take 1 tablet (10 mg total) by mouth daily. For cholesterol. 30 tablet 0  . Fluticasone-Salmeterol (ADVAIR DISKUS) 250-50 MCG/DOSE AEPB Inhale 1 puff into the lungs 2 (two) times daily. 1 each 5  . ibuprofen (ADVIL) 200 MG tablet Take 200-400 mg by mouth every 6 (six) hours as needed for fever or mild pain.     . Multiple Vitamins-Minerals (MULTIVITAMIN WITH MINERALS) tablet Take 1 tablet by mouth daily.     No current facility-administered medications on file prior to visit.    There are no Patient Instructions on file for this visit. No follow-ups on file.   Kris Hartmann, NP

## 2020-03-17 ENCOUNTER — Telehealth: Payer: Self-pay | Admitting: Primary Care

## 2020-03-17 NOTE — Telephone Encounter (Signed)
Please thank him for the update and kind words!  Vesta Mixer!! That's too expensive! He can look for coupons on the Advair manufactuer's website, or he can call medicare to find out what they will cover. Please have him update me. That's just too expensive!

## 2020-03-17 NOTE — Telephone Encounter (Signed)
Pt wanted to report panic attacks are gone and he is breathing more clearly now. Advair was a good call. Package was $422 for one month.   He also wanted me to let you know that you are an excellent physician and that if he could figure out how to leave a review he would.

## 2020-03-17 NOTE — Telephone Encounter (Signed)
Spoken and notified patient of Nathaniel [name] comments. Verbalized understanding.

## 2020-03-21 ENCOUNTER — Other Ambulatory Visit: Payer: Self-pay

## 2020-03-21 ENCOUNTER — Ambulatory Visit (INDEPENDENT_AMBULATORY_CARE_PROVIDER_SITE_OTHER): Payer: Medicare Other

## 2020-03-21 DIAGNOSIS — I6521 Occlusion and stenosis of right carotid artery: Secondary | ICD-10-CM | POA: Diagnosis not present

## 2020-03-21 DIAGNOSIS — Z1211 Encounter for screening for malignant neoplasm of colon: Secondary | ICD-10-CM | POA: Diagnosis not present

## 2020-03-21 DIAGNOSIS — I6529 Occlusion and stenosis of unspecified carotid artery: Secondary | ICD-10-CM

## 2020-03-23 ENCOUNTER — Telehealth: Payer: Self-pay

## 2020-03-23 NOTE — Telephone Encounter (Signed)
Left detailed message for patient regarding results.

## 2020-03-23 NOTE — Telephone Encounter (Signed)
-----   Message from Pleas Koch, NP sent at 03/23/2020  1:50 PM EDT ----- Please call patient:  The carotid artery ultrasound appears to be about the same as it was several years ago, but he does have evidence of plaque buildup.  Ideally medications such as Plavix and statin medicines can help prevent further plaque buildup, but I know he cannot tolerate.  I recommend he share these results with his vascular doctor later this year as scheduled.

## 2020-03-25 ENCOUNTER — Telehealth: Payer: Self-pay | Admitting: Primary Care

## 2020-03-25 LAB — COLOGUARD: Cologuard: POSITIVE — AB

## 2020-03-25 NOTE — Telephone Encounter (Signed)
-----   Message from Pleas Koch, NP sent at 03/23/2020  1:50 PM EDT ----- Please call patient:  The carotid artery ultrasound appears to be about the same as it was several years ago, but he does have evidence of plaque buildup.  Ideally medications such as Plavix and statin medicines can help prevent further plaque buildup, but I know he cannot tolerate.  I recommend he share these results with his vascular doctor later this year as scheduled.

## 2020-03-25 NOTE — Telephone Encounter (Signed)
Please notify patient that his cologuard test came back positive.  Recommendations are for a colonoscopy, is he agreeable?

## 2020-03-25 NOTE — Telephone Encounter (Signed)
Left message for patient to call office regarding ultrasound results and cologuard results and recommendations.

## 2020-03-30 ENCOUNTER — Telehealth: Payer: Self-pay

## 2020-03-30 ENCOUNTER — Telehealth: Payer: Self-pay | Admitting: Primary Care

## 2020-03-30 DIAGNOSIS — Z1211 Encounter for screening for malignant neoplasm of colon: Secondary | ICD-10-CM

## 2020-03-30 NOTE — Telephone Encounter (Signed)
[place] [facility] called to let you know that faxed abnormal colorguard results 9/3 and wanted to make sure you received

## 2020-03-30 NOTE — Telephone Encounter (Signed)
Followed up with [facility] and advised we have already contacted patient and processed the information.

## 2020-03-30 NOTE — Telephone Encounter (Signed)
-----   Message from Pleas Koch, NP sent at 03/23/2020  1:50 PM EDT ----- Please call patient:  The carotid artery ultrasound appears to be about the same as it was several years ago, but he does have evidence of plaque buildup.  Ideally medications such as Plavix and statin medicines can help prevent further plaque buildup, but I know he cannot tolerate.  I recommend he share these results with his vascular doctor later this year as scheduled.

## 2020-03-30 NOTE — Telephone Encounter (Signed)
Informed patient of results and recommendations.  Orders placed.

## 2020-03-31 ENCOUNTER — Other Ambulatory Visit: Payer: Self-pay | Admitting: Primary Care

## 2020-03-31 DIAGNOSIS — E785 Hyperlipidemia, unspecified: Secondary | ICD-10-CM

## 2020-04-05 DIAGNOSIS — M9903 Segmental and somatic dysfunction of lumbar region: Secondary | ICD-10-CM | POA: Diagnosis not present

## 2020-04-05 DIAGNOSIS — M9905 Segmental and somatic dysfunction of pelvic region: Secondary | ICD-10-CM | POA: Diagnosis not present

## 2020-04-05 DIAGNOSIS — M5136 Other intervertebral disc degeneration, lumbar region: Secondary | ICD-10-CM | POA: Diagnosis not present

## 2020-04-05 DIAGNOSIS — M5416 Radiculopathy, lumbar region: Secondary | ICD-10-CM | POA: Diagnosis not present

## 2020-04-06 ENCOUNTER — Encounter (INDEPENDENT_AMBULATORY_CARE_PROVIDER_SITE_OTHER): Payer: Self-pay

## 2020-04-08 ENCOUNTER — Ambulatory Visit (INDEPENDENT_AMBULATORY_CARE_PROVIDER_SITE_OTHER): Payer: Medicare Other

## 2020-04-08 ENCOUNTER — Other Ambulatory Visit: Payer: Self-pay

## 2020-04-08 ENCOUNTER — Telehealth (INDEPENDENT_AMBULATORY_CARE_PROVIDER_SITE_OTHER): Payer: Self-pay

## 2020-04-08 ENCOUNTER — Encounter (INDEPENDENT_AMBULATORY_CARE_PROVIDER_SITE_OTHER): Payer: Self-pay | Admitting: Nurse Practitioner

## 2020-04-08 ENCOUNTER — Other Ambulatory Visit (INDEPENDENT_AMBULATORY_CARE_PROVIDER_SITE_OTHER): Payer: Self-pay | Admitting: Nurse Practitioner

## 2020-04-08 ENCOUNTER — Other Ambulatory Visit
Admission: RE | Admit: 2020-04-08 | Discharge: 2020-04-08 | Disposition: A | Discharge status: Home / Self Care | Payer: Medicare Other | Source: Ambulatory Visit | Attending: Vascular Surgery | Admitting: Vascular Surgery

## 2020-04-08 ENCOUNTER — Ambulatory Visit (INDEPENDENT_AMBULATORY_CARE_PROVIDER_SITE_OTHER): Payer: Medicare Other | Admitting: Nurse Practitioner

## 2020-04-08 VITALS — BP 159/88 | HR 82 | Resp 16 | Wt 217.8 lb

## 2020-04-08 DIAGNOSIS — I998 Other disorder of circulatory system: Secondary | ICD-10-CM | POA: Diagnosis not present

## 2020-04-08 DIAGNOSIS — Z01812 Encounter for preprocedural laboratory examination: Secondary | ICD-10-CM | POA: Insufficient documentation

## 2020-04-08 DIAGNOSIS — M79605 Pain in left leg: Secondary | ICD-10-CM

## 2020-04-08 DIAGNOSIS — I70212 Atherosclerosis of native arteries of extremities with intermittent claudication, left leg: Secondary | ICD-10-CM

## 2020-04-08 DIAGNOSIS — E785 Hyperlipidemia, unspecified: Secondary | ICD-10-CM

## 2020-04-08 DIAGNOSIS — Z20822 Contact with and (suspected) exposure to covid-19: Secondary | ICD-10-CM | POA: Insufficient documentation

## 2020-04-08 MED ORDER — HYDROCODONE-ACETAMINOPHEN 5-325 MG PO TABS: 1.0000 | ORAL_TABLET | Freq: Four times a day (QID) | ORAL | 0 refills | Status: DC | PRN

## 2020-04-08 NOTE — Telephone Encounter (Signed)
Patient was seen today and has been scheduled with Dr. Lucky Cowboy for a LLE angio on 04/11/20 with a 1:15 pm arrival time to the [facility]. Covid testing today before 11:00 am at the [place]. Pre-procedure instructions were discussed and given to patient.

## 2020-04-09 LAB — SARS CORONAVIRUS 2 (TAT 6-24 HRS): SARS Coronavirus 2: NEGATIVE

## 2020-04-10 ENCOUNTER — Encounter (INDEPENDENT_AMBULATORY_CARE_PROVIDER_SITE_OTHER): Payer: Self-pay | Admitting: Nurse Practitioner

## 2020-04-10 NOTE — Progress Notes (Signed)
Subjective:    Patient ID: Nathaniel [name], male    DOB: [birth year], 69 y.o.   MRN: [id number] Chief Complaint  Patient presents with  . Follow-up    ultrasound follow up    The patient returns to the office for followup and review of the noninvasive studies. There has been a significant deterioration in the lower extremity symptoms.  The patient notes interval shortening of their claudication distance and development of mild rest pain symptoms. No new ulcers or wounds have occurred since the last visit.  There have been no significant changes to the patient's overall health care.  The patient denies amaurosis fugax or recent TIA symptoms. There are no recent neurological changes noted. The patient denies history of DVT, PE or superficial thrombophlebitis. The patient denies recent episodes of angina or shortness of breath.   ABI's Rt=1.09 and Lt=0.47 (previous ABI's Rt=1.13 and Lt=1.07) Duplex US of the lower extremity arterial system shows triphasic tibial artery waveforms in the right lower extremity with a limited duplex demonstrating no flow detected in the left proximal SFA to proximal popliteal artery stent as well as in the distal anterior tibial or distal peroneal artery.  There is dampened monophasic flow within the posterior tibial artery.   Review of Systems  Cardiovascular:       Claudication  Skin: Positive for pallor.  Neurological: Positive for weakness.  All other systems reviewed and are negative.      Objective:   Physical Exam Vitals reviewed.  HENT:     Head: Normocephalic.  Cardiovascular:     Rate and Rhythm: Normal rate.     Pulses: Decreased pulses.  Pulmonary:     Effort: Pulmonary effort is normal.  Skin:    General: Skin is dry.     Capillary Refill: Capillary refill takes more than 3 seconds.  Neurological:     Mental Status: He is alert and oriented to person, place, and time.  Psychiatric:        Mood and Affect: Mood normal.         Behavior: Behavior normal.        Thought Content: Thought content normal.        Judgment: Judgment normal.     BP (!) 159/88 (BP Location: Right Arm)   Pulse 82   Resp 16   Wt 217 lb 12.8 oz (98.8 kg)   BMI 26.51 kg/m   Past Medical History:  Diagnosis Date  . Cataract   . Chicken pox   . GERD (gastroesophageal reflux disease)   . Hyperlipidemia   . PAD (peripheral artery disease) ([place])   . Pre-diabetes   . Seizures ([place])    None since age 18    Social History   Socioeconomic History  . Marital status: Married    Spouse name: Not on file  . Number of children: Not on file  . Years of education: Not on file  . Highest education level: Not on file  Occupational History  . Not on file  Tobacco Use  . Smoking status: Former Smoker    Packs/day: 1.00    Years: 50.00    Pack years: 50.00    Types: Cigarettes    Quit date: 11/21/2019    Years since quitting: 0.3  . Smokeless tobacco: Never Used  Vaping Use  . Vaping Use: Never used  Substance and Sexual Activity  . Alcohol use: Not Currently    Alcohol/week: 0.0 standard drinks  . Drug use:  No  . Sexual activity: Not on file  Other Topics Concern  . Not on file  Social History Narrative   Retired.   Stays busy with household work, Surveyor, mining.   Married for 3 years.   Grown children.   Enjoys reading.    Social Determinants of Health   Financial Resource Strain: Low Risk   . Difficulty of Paying Living Expenses: Not hard at all  Food Insecurity: No Food Insecurity  . Worried About Charity fundraiser in the Last Year: Never true  . Ran Out of Food in the Last Year: Never true  Transportation Needs: No Transportation Needs  . Lack of Transportation (Medical): No  . Lack of Transportation (Non-Medical): No  Physical Activity: Inactive  . Days of Exercise per Week: 0 days  . Minutes of Exercise per Session: 0 min  Stress: No Stress Concern Present  . Feeling of Stress : Not at all  Social  Connections:   . Frequency of Communication with Friends and Family: Not on file  . Frequency of Social Gatherings with Friends and Family: Not on file  . Attends Religious Services: Not on file  . Active Member of Clubs or Organizations: Not on file  . Attends Archivist Meetings: Not on file  . Marital Status: Not on file  Intimate Partner Violence: Not At Risk  . Fear of Current or Ex-Partner: No  . Emotionally Abused: No  . Physically Abused: No  . Sexually Abused: No    Past Surgical History:  Procedure Laterality Date  . BACK SURGERY    . LAMINECTOMY  1987  . LOWER EXTREMITY ANGIOGRAPHY Left 05/02/2017   Procedure: Lower Extremity Angiography;  Surgeon: Algernon Huxley, MD;  Location: [facility];  Service: Cardiovascular;  Laterality: Left;  . LOWER EXTREMITY ANGIOGRAPHY Left 11/16/2019   Procedure: LOWER EXTREMITY ANGIOGRAPHY;  Surgeon: Algernon Huxley, MD;  Location: [facility];  Service: Cardiovascular;  Laterality: Left;  . TONSILLECTOMY      Family History  Problem Relation Age of Onset  . Heart disease Mother        Arrthymia   . Stroke Father        Deceased  . Dementia Father   . Heart disease Brother        Myocardial infarction  . Colon cancer Neg Hx     Allergies  Allergen Reactions  . Fentanyl Other (See Comments)    chills  . Prozac [Fluoxetine Hcl] Other (See Comments)    Extreme depression  . Statins        Assessment & Plan:   1. Ischemia of left lower extremity Recommend:  The patient has evidence of severe atherosclerotic changes of both lower extremities with rest pain that is associated with preulcerative changes and impending tissue loss of the foot.  This represents a limb threatening ischemia and places the patient at the risk for limb loss.  Patient should undergo angiography of the lower extremities with the hope for intervention for limb salvage.  The risks and benefits as well as the alternative therapies  was discussed in detail with the patient.  All questions were answered.  Patient agrees to proceed with angiography.  The patient will follow up with me in the office after the procedure.       2. Hyperlipidemia, unspecified hyperlipidemia type Continue statin as ordered and reviewed, no changes at this time    Current Outpatient Medications on File Prior  to Visit  Medication Sig Dispense Refill  . albuterol (VENTOLIN HFA) 108 (90 Base) MCG/ACT inhaler Inhale 2 puffs into the lungs every 6 (six) hours as needed for shortness of breath. 8 g 0  . aspirin EC 81 MG EC tablet Take 1 tablet (81 mg total) by mouth daily. 90 tablet 3  . diphenhydrAMINE (BENADRYL) 25 MG tablet Take 25 mg by mouth daily as needed for itching.     . esomeprazole (NEXIUM) 20 MG capsule Take 20 mg by mouth daily as needed (heartburn).     . ezetimibe (ZETIA) 10 MG tablet TAKE 1 TABLET (10 MG TOTAL) BY MOUTH DAILY. FOR CHOLESTEROL. 90 tablet 0  . Fluticasone-Salmeterol (ADVAIR DISKUS) 250-50 MCG/DOSE AEPB Inhale 1 puff into the lungs 2 (two) times daily. 1 each 5  . ibuprofen (ADVIL) 200 MG tablet Take 200-400 mg by mouth every 6 (six) hours as needed for fever or mild pain.     . Multiple Vitamins-Minerals (MULTIVITAMIN WITH MINERALS) tablet Take 1 tablet by mouth daily.     No current facility-administered medications on file prior to visit.    There are no Patient Instructions on file for this visit. No follow-ups on file.   Kris Hartmann, NP

## 2020-04-10 NOTE — H&P (View-Only) (Signed)
Subjective:    Patient ID: Nathaniel [name], male    DOB: 1951/01/03, 69 y.o.   MRN: [id number] Chief Complaint  Patient presents with  . Follow-up    ultrasound follow up    The patient returns to the office for followup and review of the noninvasive studies. There has been a significant deterioration in the lower extremity symptoms.  The patient notes interval shortening of their claudication distance and development of mild rest pain symptoms. No new ulcers or wounds have occurred since the last visit.  There have been no significant changes to the patient's overall health care.  The patient denies amaurosis fugax or recent TIA symptoms. There are no recent neurological changes noted. The patient denies history of DVT, PE or superficial thrombophlebitis. The patient denies recent episodes of angina or shortness of breath.   ABI's Rt=1.09 and Lt=0.47 (previous ABI's Rt=1.13 and Lt=1.07) Duplex US of the lower extremity arterial system shows triphasic tibial artery waveforms in the right lower extremity with a limited duplex demonstrating no flow detected in the left proximal SFA to proximal popliteal artery stent as well as in the distal anterior tibial or distal peroneal artery.  There is dampened monophasic flow within the posterior tibial artery.   Review of Systems  Cardiovascular:       Claudication  Skin: Positive for pallor.  Neurological: Positive for weakness.  All other systems reviewed and are negative.      Objective:   Physical Exam Vitals reviewed.  HENT:     Head: Normocephalic.  Cardiovascular:     Rate and Rhythm: Normal rate.     Pulses: Decreased pulses.  Pulmonary:     Effort: Pulmonary effort is normal.  Skin:    General: Skin is dry.     Capillary Refill: Capillary refill takes more than 3 seconds.  Neurological:     Mental Status: He is alert and oriented to person, place, and time.  Psychiatric:        Mood and Affect: Mood normal.         Behavior: Behavior normal.        Thought Content: Thought content normal.        Judgment: Judgment normal.     BP (!) 159/88 (BP Location: Right Arm)   Pulse 82   Resp 16   Wt 217 lb 12.8 oz (98.8 kg)   BMI 26.51 kg/m   Past Medical History:  Diagnosis Date  . Cataract   . Chicken pox   . GERD (gastroesophageal reflux disease)   . Hyperlipidemia   . PAD (peripheral artery disease) ([place])   . Pre-diabetes   . Seizures ([place])    None since age 10    Social History   Socioeconomic History  . Marital status: Married    Spouse name: Not on file  . Number of children: Not on file  . Years of education: Not on file  . Highest education level: Not on file  Occupational History  . Not on file  Tobacco Use  . Smoking status: Former Smoker    Packs/day: 1.00    Years: 50.00    Pack years: 50.00    Types: Cigarettes    Quit date: 11/21/2019    Years since quitting: 0.3  . Smokeless tobacco: Never Used  Vaping Use  . Vaping Use: Never used  Substance and Sexual Activity  . Alcohol use: Not Currently    Alcohol/week: 0.0 standard drinks  . Drug use:  No  . Sexual activity: Not on file  Other Topics Concern  . Not on file  Social History Narrative   Retired.   Stays busy with household work, Surveyor, mining.   Married for 3 years.   Grown children.   Enjoys reading.    Social Determinants of Health   Financial Resource Strain: Low Risk   . Difficulty of Paying Living Expenses: Not hard at all  Food Insecurity: No Food Insecurity  . Worried About Charity fundraiser in the Last Year: Never true  . Ran Out of Food in the Last Year: Never true  Transportation Needs: No Transportation Needs  . Lack of Transportation (Medical): No  . Lack of Transportation (Non-Medical): No  Physical Activity: Inactive  . Days of Exercise per Week: 0 days  . Minutes of Exercise per Session: 0 min  Stress: No Stress Concern Present  . Feeling of Stress : Not at all  Social  Connections:   . Frequency of Communication with Friends and Family: Not on file  . Frequency of Social Gatherings with Friends and Family: Not on file  . Attends Religious Services: Not on file  . Active Member of Clubs or Organizations: Not on file  . Attends Archivist Meetings: Not on file  . Marital Status: Not on file  Intimate Partner Violence: Not At Risk  . Fear of Current or Ex-Partner: No  . Emotionally Abused: No  . Physically Abused: No  . Sexually Abused: No    Past Surgical History:  Procedure Laterality Date  . BACK SURGERY    . LAMINECTOMY  1987  . LOWER EXTREMITY ANGIOGRAPHY Left 05/02/2017   Procedure: Lower Extremity Angiography;  Surgeon: Algernon Huxley, MD;  Location: [facility];  Service: Cardiovascular;  Laterality: Left;  . LOWER EXTREMITY ANGIOGRAPHY Left 11/16/2019   Procedure: LOWER EXTREMITY ANGIOGRAPHY;  Surgeon: Algernon Huxley, MD;  Location: [facility];  Service: Cardiovascular;  Laterality: Left;  . TONSILLECTOMY      Family History  Problem Relation Age of Onset  . Heart disease Mother        Arrthymia   . Stroke Father        Deceased  . Dementia Father   . Heart disease Brother        Myocardial infarction  . Colon cancer Neg Hx     Allergies  Allergen Reactions  . Fentanyl Other (See Comments)    chills  . Prozac [Fluoxetine Hcl] Other (See Comments)    Extreme depression  . Statins        Assessment & Plan:   1. Ischemia of left lower extremity Recommend:  The patient has evidence of severe atherosclerotic changes of both lower extremities with rest pain that is associated with preulcerative changes and impending tissue loss of the foot.  This represents a limb threatening ischemia and places the patient at the risk for limb loss.  Patient should undergo angiography of the lower extremities with the hope for intervention for limb salvage.  The risks and benefits as well as the alternative therapies  was discussed in detail with the patient.  All questions were answered.  Patient agrees to proceed with angiography.  The patient will follow up with me in the office after the procedure.       2. Hyperlipidemia, unspecified hyperlipidemia type Continue statin as ordered and reviewed, no changes at this time    Current Outpatient Medications on File Prior  to Visit  Medication Sig Dispense Refill  . albuterol (VENTOLIN HFA) 108 (90 Base) MCG/ACT inhaler Inhale 2 puffs into the lungs every 6 (six) hours as needed for shortness of breath. 8 g 0  . aspirin EC 81 MG EC tablet Take 1 tablet (81 mg total) by mouth daily. 90 tablet 3  . diphenhydrAMINE (BENADRYL) 25 MG tablet Take 25 mg by mouth daily as needed for itching.     . esomeprazole (NEXIUM) 20 MG capsule Take 20 mg by mouth daily as needed (heartburn).     . ezetimibe (ZETIA) 10 MG tablet TAKE 1 TABLET (10 MG TOTAL) BY MOUTH DAILY. FOR CHOLESTEROL. 90 tablet 0  . Fluticasone-Salmeterol (ADVAIR DISKUS) 250-50 MCG/DOSE AEPB Inhale 1 puff into the lungs 2 (two) times daily. 1 each 5  . ibuprofen (ADVIL) 200 MG tablet Take 200-400 mg by mouth every 6 (six) hours as needed for fever or mild pain.     . Multiple Vitamins-Minerals (MULTIVITAMIN WITH MINERALS) tablet Take 1 tablet by mouth daily.     No current facility-administered medications on file prior to visit.    There are no Patient Instructions on file for this visit. No follow-ups on file.   Kris Hartmann, NP

## 2020-04-11 ENCOUNTER — Inpatient Hospital Stay
Admission: RE | Admit: 2020-04-11 | Discharge: 2020-04-13 | DRG: 272 | Disposition: A | Discharge status: Home / Self Care | Payer: Medicare Other | Attending: Vascular Surgery | Admitting: Vascular Surgery

## 2020-04-11 ENCOUNTER — Other Ambulatory Visit: Payer: Self-pay

## 2020-04-11 ENCOUNTER — Encounter: Payer: Self-pay | Admitting: Vascular Surgery

## 2020-04-11 ENCOUNTER — Encounter
Admission: RE | Disposition: A | Discharge status: Home / Self Care | Payer: Self-pay | Source: Home / Self Care | Attending: Vascular Surgery

## 2020-04-11 DIAGNOSIS — Z7951 Long term (current) use of inhaled steroids: Secondary | ICD-10-CM | POA: Diagnosis not present

## 2020-04-11 DIAGNOSIS — Z885 Allergy status to narcotic agent status: Secondary | ICD-10-CM

## 2020-04-11 DIAGNOSIS — I70222 Atherosclerosis of native arteries of extremities with rest pain, left leg: Secondary | ICD-10-CM | POA: Diagnosis not present

## 2020-04-11 DIAGNOSIS — K219 Gastro-esophageal reflux disease without esophagitis: Secondary | ICD-10-CM | POA: Diagnosis present

## 2020-04-11 DIAGNOSIS — Z8249 Family history of ischemic heart disease and other diseases of the circulatory system: Secondary | ICD-10-CM | POA: Diagnosis not present

## 2020-04-11 DIAGNOSIS — Z823 Family history of stroke: Secondary | ICD-10-CM

## 2020-04-11 DIAGNOSIS — Z79899 Other long term (current) drug therapy: Secondary | ICD-10-CM

## 2020-04-11 DIAGNOSIS — I70229 Atherosclerosis of native arteries of extremities with rest pain, unspecified extremity: Secondary | ICD-10-CM

## 2020-04-11 DIAGNOSIS — Z7982 Long term (current) use of aspirin: Secondary | ICD-10-CM | POA: Diagnosis not present

## 2020-04-11 DIAGNOSIS — G8929 Other chronic pain: Secondary | ICD-10-CM | POA: Diagnosis present

## 2020-04-11 DIAGNOSIS — Z7984 Long term (current) use of oral hypoglycemic drugs: Secondary | ICD-10-CM | POA: Diagnosis not present

## 2020-04-11 DIAGNOSIS — Z95820 Peripheral vascular angioplasty status with implants and grafts: Secondary | ICD-10-CM

## 2020-04-11 DIAGNOSIS — Z888 Allergy status to other drugs, medicaments and biological substances status: Secondary | ICD-10-CM | POA: Diagnosis not present

## 2020-04-11 DIAGNOSIS — R7303 Prediabetes: Secondary | ICD-10-CM | POA: Diagnosis present

## 2020-04-11 DIAGNOSIS — I70223 Atherosclerosis of native arteries of extremities with rest pain, bilateral legs: Secondary | ICD-10-CM | POA: Diagnosis present

## 2020-04-11 DIAGNOSIS — Z20822 Contact with and (suspected) exposure to covid-19: Secondary | ICD-10-CM | POA: Diagnosis present

## 2020-04-11 DIAGNOSIS — Z87891 Personal history of nicotine dependence: Secondary | ICD-10-CM | POA: Diagnosis not present

## 2020-04-11 DIAGNOSIS — E785 Hyperlipidemia, unspecified: Secondary | ICD-10-CM | POA: Diagnosis present

## 2020-04-11 DIAGNOSIS — I998 Other disorder of circulatory system: Secondary | ICD-10-CM | POA: Diagnosis present

## 2020-04-11 HISTORY — DX: Peripheral vascular disease, unspecified: I73.9

## 2020-04-11 HISTORY — PX: LOWER EXTREMITY ANGIOGRAPHY: CATH118251

## 2020-04-11 LAB — CBC
HCT: 40.5 % (ref 39.0–52.0)
Hemoglobin: 13.6 g/dL (ref 13.0–17.0)
MCH: 31.3 pg (ref 26.0–34.0)
MCHC: 33.6 g/dL (ref 30.0–36.0)
MCV: 93.1 fL (ref 80.0–100.0)
Platelets: 229 10*3/uL (ref 150–400)
RBC: 4.35 MIL/uL (ref 4.22–5.81)
RDW: 13.1 % (ref 11.5–15.5)
WBC: 14 10*3/uL — ABNORMAL HIGH (ref 4.0–10.5)
nRBC: 0 % (ref 0.0–0.2)

## 2020-04-11 LAB — FIBRINOGEN: Fibrinogen: 321 mg/dL (ref 210–475)

## 2020-04-11 LAB — HEPARIN LEVEL (UNFRACTIONATED): Heparin Unfractionated: 0.73 IU/mL — ABNORMAL HIGH (ref 0.30–0.70)

## 2020-04-11 SURGERY — LOWER EXTREMITY ANGIOGRAPHY
Anesthesia: Moderate Sedation | Site: Leg Lower | Laterality: Left

## 2020-04-11 MED ORDER — SODIUM CHLORIDE 0.9 % IV SOLN: INTRAVENOUS | Status: DC

## 2020-04-11 MED ORDER — MIDAZOLAM HCL 2 MG/2ML IJ SOLN
1.0000 mg | INTRAMUSCULAR | Status: DC | PRN
  Administered 2020-04-12: 1 mg via INTRAVENOUS

## 2020-04-11 MED ORDER — HEPARIN SODIUM (PORCINE) 1000 UNIT/ML IJ SOLN
INTRAMUSCULAR | Status: AC
  Filled 2020-04-11: qty 1

## 2020-04-11 MED ORDER — DIPHENHYDRAMINE HCL 25 MG PO TABS
25.0000 mg | ORAL_TABLET | Freq: Every day | ORAL | Status: DC | PRN
  Filled 2020-04-11: qty 1

## 2020-04-11 MED ORDER — DEXTROSE-NACL 5-0.45 % IV SOLN
INTRAVENOUS | Status: DC
  Administered 2020-04-11: 75 mL via INTRAVENOUS

## 2020-04-11 MED ORDER — HYDROMORPHONE HCL 1 MG/ML IJ SOLN
INTRAMUSCULAR | Status: AC
  Administered 2020-04-11: 1 mg via INTRAVENOUS
  Filled 2020-04-11: qty 0.5

## 2020-04-11 MED ORDER — SODIUM CHLORIDE 0.9% FLUSH: 3.0000 mL | Freq: Two times a day (BID) | INTRAVENOUS | Status: DC

## 2020-04-11 MED ORDER — HYDROMORPHONE HCL 1 MG/ML IJ SOLN
INTRAMUSCULAR | Status: AC
  Administered 2020-04-11: 1 mg via INTRAVENOUS
  Filled 2020-04-11: qty 1

## 2020-04-11 MED ORDER — IBUPROFEN 200 MG PO TABS
200.0000 mg | ORAL_TABLET | Freq: Four times a day (QID) | ORAL | Status: DC | PRN
  Filled 2020-04-11: qty 1

## 2020-04-11 MED ORDER — METHYLPREDNISOLONE SODIUM SUCC 125 MG IJ SOLR: 125.0000 mg | Freq: Once | INTRAMUSCULAR | Status: DC | PRN

## 2020-04-11 MED ORDER — DIPHENHYDRAMINE HCL 50 MG/ML IJ SOLN: 50.0000 mg | Freq: Once | INTRAMUSCULAR | Status: DC | PRN

## 2020-04-11 MED ORDER — ONDANSETRON HCL 4 MG/2ML IJ SOLN
4.0000 mg | Freq: Four times a day (QID) | INTRAMUSCULAR | Status: DC | PRN
  Administered 2020-04-11: 4 mg via INTRAVENOUS

## 2020-04-11 MED ORDER — ONDANSETRON HCL 4 MG/2ML IJ SOLN
INTRAMUSCULAR | Status: AC
  Filled 2020-04-11: qty 2

## 2020-04-11 MED ORDER — MORPHINE SULFATE (PF) 4 MG/ML IV SOLN
INTRAVENOUS | Status: AC
  Filled 2020-04-11: qty 1

## 2020-04-11 MED ORDER — SODIUM CHLORIDE 0.9 % IV SOLN
1.0000 mg/h | INTRAVENOUS | Status: AC
  Administered 2020-04-11: 1 mg/h
  Filled 2020-04-11: qty 10

## 2020-04-11 MED ORDER — HYDROMORPHONE HCL 1 MG/ML IJ SOLN
INTRAMUSCULAR | Status: AC
  Filled 2020-04-11: qty 0.5

## 2020-04-11 MED ORDER — ASPIRIN EC 81 MG PO TBEC: 81.0000 mg | DELAYED_RELEASE_TABLET | Freq: Every day | ORAL | Status: DC

## 2020-04-11 MED ORDER — HYDROCODONE-ACETAMINOPHEN 5-325 MG PO TABS
ORAL_TABLET | ORAL | Status: AC
  Administered 2020-04-11: 1 via ORAL
  Filled 2020-04-11: qty 1

## 2020-04-11 MED ORDER — ASPIRIN EC 81 MG PO TBEC
81.0000 mg | DELAYED_RELEASE_TABLET | Freq: Every day | ORAL | Status: DC
  Administered 2020-04-13: 81 mg via ORAL
  Filled 2020-04-11 (×2): qty 1

## 2020-04-11 MED ORDER — CEFAZOLIN SODIUM-DEXTROSE 2-4 GM/100ML-% IV SOLN
INTRAVENOUS | Status: AC
  Administered 2020-04-11: 2 g via INTRAVENOUS
  Filled 2020-04-11: qty 100

## 2020-04-11 MED ORDER — HYDROMORPHONE HCL 1 MG/ML IJ SOLN: 0.5000 mg | INTRAMUSCULAR | Status: DC | PRN

## 2020-04-11 MED ORDER — ONDANSETRON HCL 4 MG/2ML IJ SOLN: 4.0000 mg | Freq: Four times a day (QID) | INTRAMUSCULAR | Status: DC | PRN

## 2020-04-11 MED ORDER — MAGNESIUM HYDROXIDE 400 MG/5ML PO SUSP
30.0000 mL | Freq: Every day | ORAL | Status: DC | PRN
  Filled 2020-04-11: qty 30

## 2020-04-11 MED ORDER — CEFAZOLIN SODIUM-DEXTROSE 1-4 GM/50ML-% IV SOLN
1.0000 g | Freq: Four times a day (QID) | INTRAVENOUS | Status: DC
  Administered 2020-04-12 – 2020-04-13 (×2): 1 g via INTRAVENOUS

## 2020-04-11 MED ORDER — SODIUM CHLORIDE 0.9 % IV SOLN
0.5000 mg/h | INTRAVENOUS | Status: DC
  Administered 2020-04-11: 0.5 mg/h
  Filled 2020-04-11: qty 10

## 2020-04-11 MED ORDER — MOMETASONE FURO-FORMOTEROL FUM 200-5 MCG/ACT IN AERO
2.0000 | INHALATION_SPRAY | Freq: Two times a day (BID) | RESPIRATORY_TRACT | Status: DC
  Administered 2020-04-11 – 2020-04-13 (×3): 2 via RESPIRATORY_TRACT
  Filled 2020-04-11: qty 8.8

## 2020-04-11 MED ORDER — EZETIMIBE 10 MG PO TABS
10.0000 mg | ORAL_TABLET | Freq: Every day | ORAL | Status: DC
  Administered 2020-04-11 – 2020-04-13 (×2): 10 mg via ORAL
  Filled 2020-04-11 (×3): qty 1

## 2020-04-11 MED ORDER — SODIUM CHLORIDE 0.9 % IV SOLN: 250.0000 mL | INTRAVENOUS | Status: DC | PRN

## 2020-04-11 MED ORDER — PANTOPRAZOLE SODIUM 40 MG PO TBEC
40.0000 mg | DELAYED_RELEASE_TABLET | Freq: Every day | ORAL | Status: DC
  Administered 2020-04-11 – 2020-04-13 (×2): 40 mg via ORAL
  Filled 2020-04-11 (×3): qty 1

## 2020-04-11 MED ORDER — HEPARIN (PORCINE) 25000 UT/250ML-% IV SOLN
600.0000 [IU]/h | INTRAVENOUS | Status: DC
  Administered 2020-04-11: 600 [IU]/h via INTRAVENOUS
  Filled 2020-04-11 (×2): qty 250

## 2020-04-11 MED ORDER — DEXTROSE-NACL 5-0.9 % IV SOLN: INTRAVENOUS | Status: DC

## 2020-04-11 MED ORDER — ONDANSETRON HCL 4 MG PO TABS: 4.0000 mg | ORAL_TABLET | Freq: Four times a day (QID) | ORAL | Status: DC | PRN

## 2020-04-11 MED ORDER — MIDAZOLAM HCL 2 MG/ML PO SYRP: 8.0000 mg | ORAL_SOLUTION | Freq: Once | ORAL | Status: DC | PRN

## 2020-04-11 MED ORDER — DOCUSATE SODIUM 100 MG PO CAPS
100.0000 mg | ORAL_CAPSULE | Freq: Two times a day (BID) | ORAL | Status: DC
  Administered 2020-04-11 – 2020-04-13 (×3): 100 mg via ORAL
  Filled 2020-04-11 (×5): qty 1

## 2020-04-11 MED ORDER — CEFAZOLIN SODIUM-DEXTROSE 1-4 GM/50ML-% IV SOLN
INTRAVENOUS | Status: AC
  Administered 2020-04-11: 1 g via INTRAVENOUS
  Filled 2020-04-11: qty 50

## 2020-04-11 MED ORDER — HYDROMORPHONE HCL 1 MG/ML IJ SOLN: 1.0000 mg | Freq: Once | INTRAMUSCULAR | Status: AC | PRN

## 2020-04-11 MED ORDER — SORBITOL 70 % SOLN
30.0000 mL | Freq: Every day | Status: DC | PRN
  Filled 2020-04-11: qty 30

## 2020-04-11 MED ORDER — CEFAZOLIN SODIUM-DEXTROSE 2-4 GM/100ML-% IV SOLN: 2.0000 g | Freq: Once | INTRAVENOUS | Status: AC

## 2020-04-11 MED ORDER — FAMOTIDINE 20 MG PO TABS: 40.0000 mg | ORAL_TABLET | Freq: Once | ORAL | Status: DC | PRN

## 2020-04-11 MED ORDER — ALBUTEROL SULFATE HFA 108 (90 BASE) MCG/ACT IN AERS
2.0000 | INHALATION_SPRAY | Freq: Four times a day (QID) | RESPIRATORY_TRACT | Status: DC | PRN
  Filled 2020-04-11: qty 6.7

## 2020-04-11 MED ORDER — ADULT MULTIVITAMIN W/MINERALS CH
1.0000 | ORAL_TABLET | Freq: Every day | ORAL | Status: DC
  Administered 2020-04-11 – 2020-04-13 (×2): 1 via ORAL
  Filled 2020-04-11 (×3): qty 1

## 2020-04-11 MED ORDER — HYDROCODONE-ACETAMINOPHEN 5-325 MG PO TABS
1.0000 | ORAL_TABLET | Freq: Four times a day (QID) | ORAL | Status: DC | PRN
  Administered 2020-04-12: 1 via ORAL

## 2020-04-11 MED ORDER — MIDAZOLAM HCL 2 MG/2ML IJ SOLN
INTRAMUSCULAR | Status: AC
  Filled 2020-04-11: qty 2

## 2020-04-11 MED ORDER — MIDAZOLAM HCL 2 MG/2ML IJ SOLN
INTRAMUSCULAR | Status: DC | PRN
  Administered 2020-04-11 (×2): 2 mg via INTRAVENOUS

## 2020-04-11 MED ORDER — MIDAZOLAM HCL 5 MG/5ML IJ SOLN
INTRAMUSCULAR | Status: AC
  Administered 2020-04-11: 1 mg via INTRAVENOUS
  Filled 2020-04-11: qty 5

## 2020-04-11 MED ORDER — FENTANYL CITRATE (PF) 100 MCG/2ML IJ SOLN
INTRAMUSCULAR | Status: AC
  Filled 2020-04-11: qty 2

## 2020-04-11 MED ORDER — HEPARIN SODIUM (PORCINE) 1000 UNIT/ML IJ SOLN
INTRAMUSCULAR | Status: DC | PRN
  Administered 2020-04-11: 5000 [IU] via INTRAVENOUS

## 2020-04-11 MED ORDER — SODIUM CHLORIDE 0.9% FLUSH: 3.0000 mL | INTRAVENOUS | Status: DC | PRN

## 2020-04-11 MED ORDER — MORPHINE SULFATE (PF) 4 MG/ML IV SOLN
5.0000 mg | INTRAVENOUS | Status: DC | PRN
  Administered 2020-04-11 – 2020-04-12 (×2): 5 mg via INTRAVENOUS

## 2020-04-11 SURGICAL SUPPLY — 17 items
BALLN LUTONIX 018 5X300X130 (BALLOONS) ×3
BALLOON LUTONIX 018 5X300X130 (BALLOONS) ×1 IMPLANT
CANISTER PENUMBRA ENGINE (MISCELLANEOUS) ×3 IMPLANT
CATH ANGIO 5F PIGTAIL 65CM (CATHETERS) ×3 IMPLANT
CATH BEACON 5 .038 100 VERT TP (CATHETERS) ×3 IMPLANT
CATH INFUS 135CMX50CM (CATHETERS) ×3 IMPLANT
CATH LIGHTNING 7 XTORQ 130 (CATHETERS) ×3 IMPLANT
GLIDEWIRE ADV .035X260CM (WIRE) ×3 IMPLANT
KIT ENCORE 26 ADVANTAGE (KITS) ×3 IMPLANT
PACK ANGIOGRAPHY (CUSTOM PROCEDURE TRAY) ×3 IMPLANT
SHEATH BRITE TIP 5FRX11 (SHEATH) ×3 IMPLANT
SHEATH PINNACLE MP 7F 45CM (SHEATH) ×3 IMPLANT
SYR MEDRAD MARK 7 150ML (SYRINGE) ×3 IMPLANT
TUBING CONTRAST HIGH PRESS 72 (TUBING) ×6 IMPLANT
VALVE HEMO TOUHY BORST Y (ADAPTER) ×3 IMPLANT
WIRE G V18X300CM (WIRE) ×3 IMPLANT
WIRE J 3MM .035X145CM (WIRE) ×3 IMPLANT

## 2020-04-11 NOTE — Progress Notes (Signed)
Pharmacy called for heparin and tpa

## 2020-04-11 NOTE — Op Note (Signed)
[facility]  Percutaneous Study/Intervention Procedural Note   Date of Surgery: 04/11/2020  Surgeon(s):Jemario Poitras    Assistants:none  Pre-operative Diagnosis: PAD with rest pain, acute on chronic ischemia LLE  Post-operative diagnosis:  Same  Procedure(s) Performed:             1.  Ultrasound guidance for vascular access right femoral artery             2.  Catheter placement into left SFA from right femoral approach             3.  Aortogram and selective left lower extremity angiogram             4.   Infusion for thrombolysis with 4 mg of TPA in the left SFA and popliteal arteries             5.   Mechanical thrombectomy of the left SFA, popliteal artery, and tibioperoneal trunk using the penumbra CAT 7 device  6.  Percutaneous transluminal angioplasty of the left SFA and popliteal arteries with 2 inflations with a 5 mm diameter by 30 cm length Lutonix drug-coated angioplasty balloon             7.   Placement of infusion catheter for overnight thrombolytic therapy using a 135 cm total length 50 cm working length catheter from the proximal left SFA down through the tibioperoneal trunk  EBL: 250 cc  Contrast: 60 cc  Fluoro Time: 7.4 minutes  Moderate Conscious Sedation Time: approximately 65 minutes using 4 mg of Versed and 1 mg of Dilaudid               Indications:  Patient is a 69 y.o.male with recurrent rest pain in the left foot. The patient has noninvasive study showing occlusion of his previous SFA and popliteal interventions. The patient is brought in for angiography for further evaluation and potential treatment.  Due to the limb threatening nature of the situation, angiogram was performed for attempted limb salvage. The patient is aware that if the procedure fails, amputation would be expected.  The patient also understands that even with successful revascularization, amputation may still be required due to the severity of the situation.  Risks and  benefits are discussed and informed consent is obtained.   Procedure:  The patient was identified and appropriate procedural time out was performed.  The patient was then placed supine on the table and prepped and draped in the usual sterile fashion. Moderate conscious sedation was administered during a face to face encounter with the patient throughout the procedure with my supervision of the RN administering medicines and monitoring the patient's vital signs, pulse oximetry, telemetry and mental status throughout from the start of the procedure until the patient was taken to the recovery room. Ultrasound was used to evaluate the right common femoral artery.  It was patent .  A digital ultrasound image was acquired.  A Seldinger needle was used to access the right common femoral artery under direct ultrasound guidance and a permanent image was performed.  A 0.035 J wire was advanced without resistance and a 5Fr sheath was placed.  Pigtail catheter was placed into the aorta and an AP aortogram was performed. This demonstrated normal renal arteries and normal aorta and iliac segments without significant stenosis. I then crossed the aortic bifurcation and advanced to the left femoral head and then into the proximal left SFA. Selective left lower extremity angiogram was then performed. This demonstrated common  femoral artery and profunda femoris artery appeared to have reasonably good flow.  The SFA was occluded several centimeters above the previously placed stents with what appeared to be some distal reconstitution down in the tibioperoneal trunk or distal popliteal artery with faint tibial runoff distally. It was felt that it was in the patient's best interest to proceed with intervention after these images to avoid a second procedure and a larger amount of contrast and fluoroscopy based off of the findings from the initial angiogram. The patient was systemically heparinized and a 7 [place] destination sheath was  then placed over the Terumo Advantage wire. I then used a Kumpe catheter and the advantage wire to navigate down through the SFA and popliteal occlusions with no difficulty and get down into the peroneal artery where imaging was performed showing intraluminal flow.  I then placed a 0.018 wire and remove the diagnostic catheter.  4 mg of TPA was instilled in the left SFA and popliteal arteries.  I then proceeded with mechanical thrombectomy.  The penumbra CAT 7 device was brought onto the field and 5 passes were made from the proximal SFA down through the entirety of the SFA and popliteal arteries and into the tibioperoneal trunk.  Differing levels were used with each pass and an extremely large amount of thrombus was removed, but there remained no significant flow.  I then elected to balloon the area with 2 inflations with a 5 mm diameter by 30 cm length Lutonix drug-coated angioplasty balloon starting in the below-knee popliteal artery and going up the proximal SFA.  Both inflations were 8 atm for 1 minute.  Completion imaging showed extensive residual thrombus with no flow and now very poor runoff with the catheter in the tibial vessels with thrombus in the TP trunk and the proximal portions of the posterior tibial and peroneal arteries.  I felt we had no other option other than a continuous drip of TPA in hopes of reducing the thrombus burden and restoring flow tomorrow. I elected to place a 135 cm total length 50 cm working length catheter for overnight thrombolytic therapy.  This was parked with the proximal extent in the proximal SFA and going throughout the remainder of the SFA and popliteal arteries down to the tibioperoneal trunk.  The sheath was secured with a silk suture as well as the thrombolysis catheter.  The patient was taken to the recovery room in stable condition having tolerated the procedure well.  Findings:               Aortogram:  The renal arteries appeared patent.  The aorta and iliac  arteries appeared patent with only minimal to mild disease which was not flow-limiting             Left lower Extremity:  The common femoral artery and profunda femoris artery appeared to have reasonably good flow.  The SFA was occluded several centimeters above the previously placed stents with what appeared to be some distal reconstitution down in the tibioperoneal trunk or distal popliteal artery with faint tibial runoff distally.   Disposition: Patient was taken to the recovery room in stable condition having tolerated the procedure well.  Complications: None  Leotis Pain 04/11/2020 5:23 PM   This note was created with Dragon Medical transcription system. Any errors in dictation are purely unintentional.

## 2020-04-11 NOTE — Progress Notes (Deleted)
Patient has MRI scheduled after today's procedure of port placement. Spoke with Nathaniel [name] w/ MRI and she requested we leave the PIV in place for the ordered MRI. Patient will be registered here and transported to MRI. MRI will remove PIV before discharge from their area.

## 2020-04-11 NOTE — Interval H&P Note (Signed)
History and Physical Interval Note:  04/11/2020 1:16 PM  Nathaniel [name]  has presented today for surgery, with the diagnosis of LT lower extremity angio   BARD   ASO w rest pain  Covid  Sept 17.  The various methods of treatment have been discussed with the patient and family. After consideration of risks, benefits and other options for treatment, the patient has consented to  Procedure(s): LOWER EXTREMITY ANGIOGRAPHY (Left) as a surgical intervention.  The patient's history has been reviewed, patient examined, no change in status, stable for surgery.  I have reviewed the patient's chart and labs.  Questions were answered to the patient's satisfaction.     Leotis Pain

## 2020-04-12 ENCOUNTER — Encounter: Payer: Self-pay | Admitting: Vascular Surgery

## 2020-04-12 ENCOUNTER — Encounter
Admission: RE | Disposition: A | Discharge status: Home / Self Care | Payer: Self-pay | Source: Home / Self Care | Attending: Vascular Surgery

## 2020-04-12 DIAGNOSIS — I70222 Atherosclerosis of native arteries of extremities with rest pain, left leg: Secondary | ICD-10-CM

## 2020-04-12 HISTORY — PX: LOWER EXTREMITY ANGIOGRAPHY: CATH118251

## 2020-04-12 LAB — FIBRINOGEN
Fibrinogen: 150 mg/dL — ABNORMAL LOW (ref 210–475)
Fibrinogen: 147 mg/dL — ABNORMAL LOW (ref 210–475)

## 2020-04-12 LAB — CBC
HCT: 39.4 % (ref 39.0–52.0)
HCT: 38 % — ABNORMAL LOW (ref 39.0–52.0)
Hemoglobin: 13.2 g/dL (ref 13.0–17.0)
Hemoglobin: 13.1 g/dL (ref 13.0–17.0)
MCH: 31.5 pg (ref 26.0–34.0)
MCH: 31.2 pg (ref 26.0–34.0)
MCHC: 33.5 g/dL (ref 30.0–36.0)
MCHC: 34.5 g/dL (ref 30.0–36.0)
MCV: 94 fL (ref 80.0–100.0)
MCV: 90.5 fL (ref 80.0–100.0)
Platelets: 178 10*3/uL (ref 150–400)
Platelets: 157 10*3/uL (ref 150–400)
RBC: 4.19 MIL/uL — ABNORMAL LOW (ref 4.22–5.81)
RBC: 4.2 MIL/uL — ABNORMAL LOW (ref 4.22–5.81)
RDW: 13 % (ref 11.5–15.5)
RDW: 13.2 % (ref 11.5–15.5)
WBC: 17.6 10*3/uL — ABNORMAL HIGH (ref 4.0–10.5)
WBC: 16.1 10*3/uL — ABNORMAL HIGH (ref 4.0–10.5)
nRBC: 0 % (ref 0.0–0.2)
nRBC: 0 % (ref 0.0–0.2)

## 2020-04-12 SURGERY — LOWER EXTREMITY ANGIOGRAPHY
Anesthesia: Moderate Sedation | Laterality: Left

## 2020-04-12 MED ORDER — CEFAZOLIN SODIUM-DEXTROSE 1-4 GM/50ML-% IV SOLN
INTRAVENOUS | Status: AC
  Administered 2020-04-12: 1 g via INTRAVENOUS
  Filled 2020-04-12: qty 50

## 2020-04-12 MED ORDER — SODIUM CHLORIDE 0.9 % IV SOLN: INTRAVENOUS | Status: DC

## 2020-04-12 MED ORDER — HEPARIN SODIUM (PORCINE) 1000 UNIT/ML IJ SOLN
INTRAMUSCULAR | Status: AC
  Filled 2020-04-12: qty 1

## 2020-04-12 MED ORDER — IODIXANOL 320 MG/ML IV SOLN
INTRAVENOUS | Status: DC | PRN
  Administered 2020-04-12: 35 mL via INTRA_ARTERIAL

## 2020-04-12 MED ORDER — CEFAZOLIN SODIUM-DEXTROSE 1-4 GM/50ML-% IV SOLN
INTRAVENOUS | Status: AC
  Filled 2020-04-12: qty 50

## 2020-04-12 MED ORDER — HYDROCODONE-ACETAMINOPHEN 5-325 MG PO TABS
ORAL_TABLET | ORAL | Status: AC
  Filled 2020-04-12: qty 1

## 2020-04-12 MED ORDER — HYDROMORPHONE HCL 1 MG/ML IJ SOLN: 1.0000 mg | Freq: Once | INTRAMUSCULAR | Status: DC | PRN

## 2020-04-12 MED ORDER — MIDAZOLAM HCL 5 MG/5ML IJ SOLN
INTRAMUSCULAR | Status: AC
  Filled 2020-04-12: qty 5

## 2020-04-12 MED ORDER — TIROFIBAN (AGGRASTAT) BOLUS VIA INFUSION
25.0000 ug/kg | Freq: Once | INTRAVENOUS | Status: AC
  Filled 2020-04-12: qty 50

## 2020-04-12 MED ORDER — MIDAZOLAM HCL 2 MG/2ML IJ SOLN
INTRAMUSCULAR | Status: DC | PRN
  Administered 2020-04-12: 2 mg via INTRAVENOUS

## 2020-04-12 MED ORDER — TIROFIBAN HCL IV 12.5 MG/250 ML
INTRAVENOUS | Status: AC
  Administered 2020-04-12: 2495 ug via INTRAVENOUS
  Filled 2020-04-12: qty 250

## 2020-04-12 MED ORDER — TIROFIBAN HCL IN NACL 5-0.9 MG/100ML-% IV SOLN
0.1500 ug/kg/min | INTRAVENOUS | Status: DC
  Administered 2020-04-12 – 2020-04-13 (×2): 0.15 ug/kg/min via INTRAVENOUS
  Filled 2020-04-12 (×6): qty 100

## 2020-04-12 MED ORDER — HYDROMORPHONE HCL 1 MG/ML IJ SOLN
INTRAMUSCULAR | Status: AC
  Administered 2020-04-12: 1 mg via INTRAVENOUS
  Filled 2020-04-12: qty 1

## 2020-04-12 MED ORDER — HYDROCODONE-ACETAMINOPHEN 5-325 MG PO TABS: 1.0000 | ORAL_TABLET | Freq: Four times a day (QID) | ORAL | Status: DC | PRN

## 2020-04-12 MED ORDER — MORPHINE SULFATE (PF) 10 MG/ML IV SOLN
INTRAVENOUS | Status: AC
  Filled 2020-04-12: qty 1

## 2020-04-12 SURGICAL SUPPLY — 16 items
BALLN LUTONIX DCB 5X40X130 (BALLOONS) ×3
BALLN ULTRSCOR 014 2.5X150X150 (BALLOONS)
BALLN ULTRVRSE 018 2.5X150X150 (BALLOONS) ×3
BALLOON LUTONIX DCB 5X40X130 (BALLOONS) ×1 IMPLANT
BALLOON ULTRSC 014 2.5X150X150 (BALLOONS) IMPLANT
BALLOON ULTRVS 018 2.5X150X150 (BALLOONS) ×1 IMPLANT
CANISTER PENUMBRA ENGINE (MISCELLANEOUS) ×3 IMPLANT
CATH BEACON 5 .038 100 VERT TP (CATHETERS) ×3 IMPLANT
CATH INDIGO CAT6 KIT (CATHETERS) ×3 IMPLANT
DEVICE SAFEGUARD 24CM (GAUZE/BANDAGES/DRESSINGS) ×3 IMPLANT
DEVICE STARCLOSE SE CLOSURE (Vascular Products) ×3 IMPLANT
KIT ENCORE 26 ADVANTAGE (KITS) ×3 IMPLANT
PACK ANGIOGRAPHY (CUSTOM PROCEDURE TRAY) ×3 IMPLANT
STENT VIABAHN 6X25X120 (Permanent Stent) ×3 IMPLANT
WIRE G V18X300CM (WIRE) ×3 IMPLANT
WIRE J 3MM .035X145CM (WIRE) ×3 IMPLANT

## 2020-04-12 NOTE — Progress Notes (Signed)
Notified Dr. Lucky Cowboy of fibrinogen level. Order received to hold TPA for one hour and then restart at current dose.

## 2020-04-12 NOTE — Op Note (Signed)
[facility]  Percutaneous Study/Intervention Procedural Note   Date of Surgery: 04/11/2020 - 04/12/2020  Surgeon(s):Issam Carlyon    Assistants:none  Pre-operative Diagnosis: PAD with rest pain left lower extremity, status post overnight thrombolytic therapy  Post-operative diagnosis:  Same  Procedure(s) Performed:             1.   Left lower extremity angiogram             2.  Catheter placement into left anterior tibial artery from right femoral approach             3.   Mechanical thrombectomy of the left anterior tibial artery with the penumbra cat 6 device             4.  Percutaneous transluminal angioplasty of the left anterior tibial artery with 2.5 mm diameter by 15 cm length angioplasty balloon             5.   Viabahn stent placement to the left popliteal artery with 6 mm diameter by 2.5 cm length stent postdilated with a 5 mm diameter Lutonix drug-coated balloon  6.   StarClose closure device right femoral artery  EBL: 25 cc  Contrast: 35 cc  Fluoro Time: 3.5 minutes  Moderate Conscious Sedation Time: approximately 29 minutes using 2 mg of Versed and 1 mg of Dilaudid               Indications:  Patient is a 69 y.o.male with recurrent rest pain of the left leg status post overnight thrombolytic therapy. The patient is brought in for angiography for further evaluation and potential treatment.  Due to the limb threatening nature of the situation, angiogram was performed for attempted limb salvage. The patient is aware that if the procedure fails, amputation would be expected.  The patient also understands that even with successful revascularization, amputation may still be required due to the severity of the situation.  Risks and benefits are discussed and informed consent is obtained.   Procedure:  The patient was identified and appropriate procedural time out was performed.  The patient was then placed supine on the table and prepped and draped in the  usual sterile fashion. Moderate conscious sedation was administered during a face to face encounter with the patient throughout the procedure with my supervision of the RN administering medicines and monitoring the patient's vital signs, pulse oximetry, telemetry and mental status throughout from the start of the procedure until the patient was taken to the recovery room.  The existing lytic catheter was removed and a V 18 wire was then placed.  Imaging was initially performed through the sheath.  Selective left lower extremity angiogram was then performed. This demonstrated the left common femoral artery, profunda femoris artery, and proximal superficial femoral artery above the stents are now all patent without thrombus.  The stents are patent and the thrombus has been resolved until the distal aspect of the stent in the popliteal artery just above the knee.  At this location, there is a divot in the artery that represents either residual thrombus or chronic dissection creating flow limitation.  The tibioperoneal trunk had mild disease with the peroneal artery and the posterior tibial arteries were patent.  The anterior tibial artery had an abrupt occlusion about 4 to 5 cm beyond the origin consistent with thrombus.  This was a marked improvement but there was still areas for intervention.  I then brought a Kumpe catheter down and was able to  cannulate the anterior tibial artery and cross the thrombus without difficulty with a V 18 wire.  I then used the penumbra cat 6 device and perform mechanical thrombectomy of the left anterior tibial artery with a large rim of thrombus removed.  Following this, it was quite narrowed and the proximal to mid anterior tibial artery so I selected a 2.5 mm diameter by 15 cm length angioplasty balloon inflated this to 12 atm for 1 minute in the left anterior tibial artery.  Completion imaging showed less than 20% residual stenosis in the area treated with some spasm below but  marked improvement.  He now had three-vessel runoff.  I elected to place a short covered stent in the area just below the previously placed stents to treat either the residual thrombus or chronic dissection in this area.  A 6 mm diameter by 2.5 cm length Viabahn stent was deployed in the left popliteal artery and postdilated with a 5 mm diameter by 4 cm length Lutonix drug-coated angioplasty balloon.  Completion imaging showed less than 10% residual stenosis with brisk flow distally. I elected to terminate the procedure. The sheath was removed and StarClose closure device was deployed in the right femoral artery with excellent hemostatic result. The patient was taken to the recovery room in stable condition having tolerated the procedure well.  Findings:                            Left Lower Extremity:  The left common femoral artery, profunda femoris artery, and proximal superficial femoral artery above the stents are now all patent without thrombus.  The stents are patent and the thrombus has been resolved until the distal aspect of the stent in the popliteal artery just above the knee.  At this location, there is a divot in the artery that represents either residual thrombus or chronic dissection creating flow limitation.  The tibioperoneal trunk had mild disease with the peroneal artery and the posterior tibial arteries were patent.  The anterior tibial artery had an abrupt occlusion about 4 to 5 cm beyond the origin consistent with thrombus.   Disposition: Patient was taken to the recovery room in stable condition having tolerated the procedure well.  Complications: None  Leotis Pain 04/12/2020 9:36 AM   This note was created with Dragon Medical transcription system. Any errors in dictation are purely unintentional.

## 2020-04-13 DIAGNOSIS — I998 Other disorder of circulatory system: Secondary | ICD-10-CM

## 2020-04-13 DIAGNOSIS — I70222 Atherosclerosis of native arteries of extremities with rest pain, left leg: Secondary | ICD-10-CM

## 2020-04-13 LAB — BASIC METABOLIC PANEL
Anion gap: 7 (ref 5–15)
BUN: 10 mg/dL (ref 8–23)
CO2: 30 mmol/L (ref 22–32)
Calcium: 8.6 mg/dL — ABNORMAL LOW (ref 8.9–10.3)
Chloride: 102 mmol/L (ref 98–111)
Creatinine, Ser: 1.08 mg/dL (ref 0.61–1.24)
GFR calc Af Amer: >60 mL/min (ref >60)
GFR calc non Af Amer: >60 mL/min (ref >60)
Glucose, Bld: 114 mg/dL — ABNORMAL HIGH (ref 70–99)
Potassium: 4.5 mmol/L (ref 3.5–5.1)
Sodium: 139 mmol/L (ref 135–145)

## 2020-04-13 LAB — CBC
HCT: 37.1 % — ABNORMAL LOW (ref 39.0–52.0)
Hemoglobin: 12.7 g/dL — ABNORMAL LOW (ref 13.0–17.0)
MCH: 31.7 pg (ref 26.0–34.0)
MCHC: 34.2 g/dL (ref 30.0–36.0)
MCV: 92.5 fL (ref 80.0–100.0)
Platelets: 146 10*3/uL — ABNORMAL LOW (ref 150–400)
RBC: 4.01 MIL/uL — ABNORMAL LOW (ref 4.22–5.81)
RDW: 13.5 % (ref 11.5–15.5)
WBC: 10.8 10*3/uL — ABNORMAL HIGH (ref 4.0–10.5)
nRBC: 0 % (ref 0.0–0.2)

## 2020-04-13 LAB — MAGNESIUM: Magnesium: 2.1 mg/dL (ref 1.7–2.4)

## 2020-04-13 MED ORDER — APIXABAN 5 MG PO TABS
5.0000 mg | ORAL_TABLET | Freq: Two times a day (BID) | ORAL | 3 refills | Status: DC
Start: 2020-04-13 — End: 2021-04-20

## 2020-04-13 MED ORDER — CEFAZOLIN SODIUM-DEXTROSE 1-4 GM/50ML-% IV SOLN
INTRAVENOUS | Status: AC
  Filled 2020-04-13: qty 50

## 2020-04-13 MED ORDER — HYDROCODONE-ACETAMINOPHEN 5-325 MG PO TABS
1.0000 | ORAL_TABLET | Freq: Four times a day (QID) | ORAL | 0 refills | Status: DC | PRN
Start: 2020-04-13 — End: 2021-04-13

## 2020-04-13 MED ORDER — CEFAZOLIN SODIUM-DEXTROSE 1-4 GM/50ML-% IV SOLN
INTRAVENOUS | Status: AC
  Administered 2020-04-13: 1 g via INTRAVENOUS
  Filled 2020-04-13: qty 50

## 2020-04-13 MED ORDER — APIXABAN 5 MG PO TABS
5.0000 mg | ORAL_TABLET | Freq: Two times a day (BID) | ORAL | Status: DC
  Administered 2020-04-13: 5 mg via ORAL
  Filled 2020-04-13 (×2): qty 1

## 2020-04-13 NOTE — Discharge Instructions (Signed)
Ok to Games developer

## 2020-04-13 NOTE — Discharge Summary (Signed)
[facility]    Discharge Summary  Patient ID:  Nathaniel [name] MRN: [id number] DOB/AGE: January 01, 1951 69 y.o.  Admit date: 04/11/2020 Discharge date: 04/13/2020 Date of Surgery: 04/12/2020 Surgeon: Surgeon(s): Dew, Erskine Squibb, MD  Admission Diagnosis: Ischemic leg [I99.8]  Discharge Diagnoses:  Ischemic leg [I99.8]  Secondary Diagnoses: Past Medical History:  Diagnosis Date   Cataract    Chicken pox    GERD (gastroesophageal reflux disease)    Hyperlipidemia    PAD (peripheral artery disease) ([place])    Peripheral vascular disease ([place])    Pre-diabetes    Seizures ([place])    None since age 60   Procedure(s): 04/11/20: 1. Ultrasound guidance for vascular access right femoral artery 2. Catheter placement into left SFA from right femoral approach 3. Aortogram and selective left lower extremity angiogram 4.  Infusion for thrombolysis with 4 mg of TPA in the left SFA and popliteal arteries 5.  Mechanical thrombectomy of the left SFA, popliteal artery, and tibioperoneal trunk using the penumbra CAT 7 device             6.  Percutaneous transluminal angioplasty of the left SFA and popliteal arteries with 2 inflations with a 5 mm diameter by 30 cm length Lutonix drug-coated angioplasty balloon 7.  Placement of infusion catheter for overnight thrombolytic therapy using a 135 cm total length 50 cm working length catheter from the proximal left SFA down through the tibioperoneal trunk  04/13/19: 1.  Left lower extremity angiogram 2. Catheter placement into left anterior tibial artery from right femoral approach 3.  Mechanical thrombectomy of the left anterior tibial artery with the penumbra cat 6 device 4. Percutaneous transluminal angioplasty of the left anterior tibial artery with 2.5 mm diameter by 15 cm length angioplasty  balloon 5.  Viabahn stent placement to the left popliteal artery with 6 mm diameter by 2.5 cm length stent postdilated with a 5 mm diameter Lutonix drug-coated balloon             6.  StarClose closure device right femoral artery  Discharged Condition: Good  HPI / Hospital Course:   Patient is a 69 y.o.male with recurrent rest pain in the left foot. The patient has noninvasive study showing occlusion of his previous SFA and popliteal interventions. The patient is brought in for angiography for further evaluation and potential treatment.  Due to the limb threatening nature of the situation, angiogram was performed for attempted limb salvage. The patient is aware that if the procedure fails, amputation would be expected.  The patient also understands that even with successful revascularization, amputation may still be required due to the severity of the situation.  Risks and benefits are discussed and informed consent is obtained.   On 04/11/20, the patient underwent: 1. Ultrasound guidance for vascular access right femoral artery 2. Catheter placement into left SFA from right femoral approach 3. Aortogram and selective left lower extremity angiogram 4.  Infusion for thrombolysis with 4 mg of TPA in the left SFA and popliteal arteries 5.  Mechanical thrombectomy of the left SFA, popliteal artery, and tibioperoneal trunk using the penumbra CAT 7 device             6.  Percutaneous transluminal angioplasty of the left SFA and popliteal arteries with 2 inflations with a 5 mm diameter by 30 cm length Lutonix drug-coated angioplasty balloon 7.  Placement of infusion catheter for overnight thrombolytic therapy using a 135 cm total length 50 cm working length catheter  from the proximal left SFA down through the tibioperoneal trunk  The patient tolerated the procedure well was transferred from the recovery room to a  monitored setting for TPA and heparin infusion overnight.  Not a procedure was unremarkable.  The following morning the patient returned to the angiography suite and underwent:  1.  Left lower extremity angiogram 2. Catheter placement into left anterior tibial artery from right femoral approach 3.  Mechanical thrombectomy of the left anterior tibial artery with the penumbra cat 6 device 4. Percutaneous transluminal angioplasty of the left anterior tibial artery with 2.5 mm diameter by 15 cm length angioplasty balloon 5.  Viabahn stent placement to the left popliteal artery with 6 mm diameter by 2.5 cm length stent postdilated with a 5 mm diameter Lutonix drug-coated balloon             6.  StarClose closure device right femoral artery  The patient tolerated the procedure well again was transferred from recovery room to monitored setting for Aggrastat infusion overnight.  Patient stented procedure was unremarkable.  The following morning, the patient was transitioned to aspirin and Eliquis.  During the patient's brief hospital stay, his diet was advanced, he was urinating, his discomfort was controlled to the use of p.o. pain medication he was ambulating at baseline.  Day of discharge, the patient had stable vital signs  Physical exam:  Alert and oriented x3, no acute distress Cardiovascular: Regular rate and rhythm Pulmonary: Clear to auscultation bilaterally Abdomen: Soft, nontender, nondistended Access site: Clean dry and intact Left lower extremity: Thigh soft.  Calves appear extremities warm distally toes.  Minimal edema.  Good capillary refill.  Motor and sensory is intact.  Labs: As below  Complications: None  Consults: None  Significant Diagnostic Studies: CBC Lab Results  Component Value Date   WBC 10.8 (H) 04/13/2020   HGB 12.7 (L) 04/13/2020   HCT 37.1 (L) 04/13/2020   MCV 92.5 04/13/2020   PLT 146 (L)  04/13/2020   BMET    Component Value Date/Time   NA 139 04/13/2020 0723   K 4.5 04/13/2020 0723   CL 102 04/13/2020 0723   CO2 30 04/13/2020 0723   GLUCOSE 114 (H) 04/13/2020 0723   BUN 10 04/13/2020 0723   CREATININE 1.08 04/13/2020 0723   CALCIUM 8.6 (L) 04/13/2020 0723   GFRNONAA >60 04/13/2020 0723   GFRAA >60 04/13/2020 0723   COAG No results found for: INR, PROTIME  Disposition:  Discharge to :Home  Allergies as of 04/13/2020      Reactions   Fentanyl Other (See Comments)   chills   Prozac [fluoxetine Hcl] Other (See Comments)   Extreme depression   Statins Other (See Comments)   Flu like symptoms      Medication List    TAKE these medications   albuterol 108 (90 Base) MCG/ACT inhaler Commonly known as: VENTOLIN HFA Inhale 2 puffs into the lungs every 6 (six) hours as needed for shortness of breath.   apixaban 5 MG Tabs tablet Commonly known as: ELIQUIS Take 1 tablet (5 mg total) by mouth 2 (two) times daily.   aspirin 81 MG EC tablet Take 1 tablet (81 mg total) by mouth daily.   diphenhydrAMINE 25 MG tablet Commonly known as: BENADRYL Take 25 mg by mouth daily as needed for itching.   esomeprazole 20 MG capsule Commonly known as: NEXIUM Take 20 mg by mouth daily as needed (heartburn).   ezetimibe 10 MG tablet Commonly known as: ZETIA  TAKE 1 TABLET (10 MG TOTAL) BY MOUTH DAILY. FOR CHOLESTEROL.   Fluticasone-Salmeterol 250-50 MCG/DOSE Aepb Commonly known as: Advair Diskus Inhale 1 puff into the lungs 2 (two) times daily.   HYDROcodone-acetaminophen 5-325 MG tablet Commonly known as: NORCO/VICODIN Take 1 tablet by mouth every 6 (six) hours as needed for moderate pain.   ibuprofen 200 MG tablet Commonly known as: ADVIL Take 200-400 mg by mouth every 6 (six) hours as needed for fever or mild pain.   multivitamin with minerals tablet Take 1 tablet by mouth daily.      Verbal and written Discharge instructions given to the patient. Wound care  per Discharge AVS  Follow-up Information    Dew, Erskine Squibb, MD Follow up in 2 week(s).   Specialties: Vascular Surgery, Radiology, Interventional Cardiology Why: Can see Dew or Arna Medici. Will need ABI with visit.  Contact information: [place] [address] [phone]              Signed: Sela Hua, PA-C  04/13/2020, 12:18 PM

## 2020-04-13 NOTE — Progress Notes (Signed)
Pt IV removed, pt ambulated in hall with walker 125ft, P.A.D removed and gauze dressing placed on site. NO bleeding noted. Pt able to dress himself and patient was discharged via wheelchair to wife. VSS at time of discharge. 1st dose of Eliquis given to patient prior to discharge

## 2020-04-21 ENCOUNTER — Other Ambulatory Visit (INDEPENDENT_AMBULATORY_CARE_PROVIDER_SITE_OTHER): Payer: Self-pay | Admitting: Vascular Surgery

## 2020-04-21 DIAGNOSIS — I70222 Atherosclerosis of native arteries of extremities with rest pain, left leg: Secondary | ICD-10-CM

## 2020-04-21 DIAGNOSIS — Z95820 Peripheral vascular angioplasty status with implants and grafts: Secondary | ICD-10-CM

## 2020-04-22 ENCOUNTER — Other Ambulatory Visit: Payer: Self-pay

## 2020-04-22 DIAGNOSIS — R195 Other fecal abnormalities: Secondary | ICD-10-CM

## 2020-04-22 DIAGNOSIS — Z1211 Encounter for screening for malignant neoplasm of colon: Secondary | ICD-10-CM

## 2020-04-22 NOTE — Progress Notes (Signed)
refe

## 2020-04-22 NOTE — Telephone Encounter (Signed)
Thank you :)

## 2020-04-29 ENCOUNTER — Encounter (INDEPENDENT_AMBULATORY_CARE_PROVIDER_SITE_OTHER): Payer: Self-pay | Admitting: Vascular Surgery

## 2020-04-29 ENCOUNTER — Telehealth: Payer: Self-pay

## 2020-04-29 ENCOUNTER — Other Ambulatory Visit: Payer: Self-pay

## 2020-04-29 ENCOUNTER — Ambulatory Visit (INDEPENDENT_AMBULATORY_CARE_PROVIDER_SITE_OTHER): Payer: Medicare Other

## 2020-04-29 ENCOUNTER — Ambulatory Visit (INDEPENDENT_AMBULATORY_CARE_PROVIDER_SITE_OTHER): Payer: Medicare Other | Admitting: Vascular Surgery

## 2020-04-29 VITALS — BP 157/88 | HR 89 | Resp 16 | Wt 214.6 lb

## 2020-04-29 DIAGNOSIS — I70222 Atherosclerosis of native arteries of extremities with rest pain, left leg: Secondary | ICD-10-CM | POA: Diagnosis not present

## 2020-04-29 DIAGNOSIS — R7303 Prediabetes: Secondary | ICD-10-CM | POA: Diagnosis not present

## 2020-04-29 DIAGNOSIS — I70212 Atherosclerosis of native arteries of extremities with intermittent claudication, left leg: Secondary | ICD-10-CM

## 2020-04-29 DIAGNOSIS — E785 Hyperlipidemia, unspecified: Secondary | ICD-10-CM

## 2020-04-29 DIAGNOSIS — Z95820 Peripheral vascular angioplasty status with implants and grafts: Secondary | ICD-10-CM | POA: Diagnosis not present

## 2020-04-29 DIAGNOSIS — J432 Centrilobular emphysema: Secondary | ICD-10-CM

## 2020-04-29 NOTE — Assessment & Plan Note (Signed)
ABIs today are in the normal range at 1.0 bilaterally with strong waveforms. He underwent extensive revascularization 2 weeks ago and seems to be doing well.  Continue Eliquis.  He has stopped smoking and is congratulated on this.  Return to clinic in 3 months with noninvasive studies or sooner if problem develop in the interim.

## 2020-04-29 NOTE — Assessment & Plan Note (Signed)
blood glucose control important in reducing the progression of atherosclerotic disease. Also, involved in wound healing. On appropriate medications.  

## 2020-04-29 NOTE — Assessment & Plan Note (Signed)
lipid control important in reducing the progression of atherosclerotic disease. Continue statin therapy  

## 2020-04-29 NOTE — Telephone Encounter (Signed)
Pt said after starting advair pt noticed some problems but thought it would get better but for last 3 wks pt said the symptoms are not getting better and are not going away. Pt said his wife looked up side effects of advair and pt has several SE. Pt said his voice comes and goes, mouth is raw, tongue is raw and white like yeast infection; pt also has H/As that are not major but annoying.pt rinses his mouth and gargles but has not helped. Pt said cb on 05/01/20 is OK. [facility]. Pt wants to change Advair back to symbicort. Pt has taken symbicort before but was too expensive; now pt said symbicort is not too expensive. Please advise.

## 2020-04-29 NOTE — Progress Notes (Signed)
MRN : [id number]  Nathaniel [name] is a 69 y.o. ([birth year]) male who presents with chief complaint of  Chief Complaint  Patient presents with  . Follow-up    [facility] 2wk post le angio  .  History of Present Illness: Patient returns today in follow up of his PAD.  About 2 weeks ago, he underwent extensive left lower extremity revascularization including overnight thrombolytic therapy.  He did well.  He went home on Eliquis which she is tolerating fairly well.  Mild swelling which is largely improved.  Access site is healed.  ABIs today are in the normal range at 1.0 bilaterally with strong waveforms.  Current Outpatient Medications  Medication Sig Dispense Refill  . albuterol (VENTOLIN HFA) 108 (90 Base) MCG/ACT inhaler Inhale 2 puffs into the lungs every 6 (six) hours as needed for shortness of breath. 8 g 0  . apixaban (ELIQUIS) 5 MG TABS tablet Take 1 tablet (5 mg total) by mouth 2 (two) times daily. 180 tablet 3  . aspirin EC 81 MG EC tablet Take 1 tablet (81 mg total) by mouth daily. 90 tablet 3  . diphenhydrAMINE (BENADRYL) 25 MG tablet Take 25 mg by mouth daily as needed for itching.     . esomeprazole (NEXIUM) 20 MG capsule Take 20 mg by mouth daily as needed (heartburn).     . ezetimibe (ZETIA) 10 MG tablet TAKE 1 TABLET (10 MG TOTAL) BY MOUTH DAILY. FOR CHOLESTEROL. 90 tablet 0  . Fluticasone-Salmeterol (ADVAIR DISKUS) 250-50 MCG/DOSE AEPB Inhale 1 puff into the lungs 2 (two) times daily. 1 each 5  . HYDROcodone-acetaminophen (NORCO/VICODIN) 5-325 MG tablet Take 1 tablet by mouth every 6 (six) hours as needed for moderate pain. 10 tablet 0  . ibuprofen (ADVIL) 200 MG tablet Take 200-400 mg by mouth every 6 (six) hours as needed for fever or mild pain.     . Multiple Vitamins-Minerals (MULTIVITAMIN WITH MINERALS) tablet Take 1 tablet by mouth daily.     No current facility-administered medications for this visit.    Past Medical History:  Diagnosis Date  . Cataract   .  Chicken pox   . GERD (gastroesophageal reflux disease)   . Hyperlipidemia   . PAD (peripheral artery disease) ([place])   . Peripheral vascular disease ([place])   . Pre-diabetes   . Seizures ([place])    None since age 63    Past Surgical History:  Procedure Laterality Date  . BACK SURGERY    . LAMINECTOMY  1987  . LOWER EXTREMITY ANGIOGRAPHY Left 05/02/2017   Procedure: Lower Extremity Angiography;  Surgeon: Algernon Huxley, MD;  Location: [facility];  Service: Cardiovascular;  Laterality: Left;  . LOWER EXTREMITY ANGIOGRAPHY Left 11/16/2019   Procedure: LOWER EXTREMITY ANGIOGRAPHY;  Surgeon: Algernon Huxley, MD;  Location: [facility];  Service: Cardiovascular;  Laterality: Left;  . LOWER EXTREMITY ANGIOGRAPHY Left 04/11/2020   Procedure: LOWER EXTREMITY ANGIOGRAPHY;  Surgeon: Algernon Huxley, MD;  Location: [facility];  Service: Cardiovascular;  Laterality: Left;  . LOWER EXTREMITY ANGIOGRAPHY Left 04/12/2020   Procedure: Lower Extremity Angiography;  Surgeon: Algernon Huxley, MD;  Location: [facility];  Service: Cardiovascular;  Laterality: Left;  . TONSILLECTOMY       Social History   Tobacco Use  . Smoking status: Former Smoker    Packs/day: 1.00    Years: 50.00    Pack years: 50.00    Types: Cigarettes  Quit date: 11/21/2019    Years since quitting: 0.4  . Smokeless tobacco: Never Used  Vaping Use  . Vaping Use: Never used  Substance Use Topics  . Alcohol use: Not Currently    Alcohol/week: 0.0 standard drinks  . Drug use: No   *   Family History  Problem Relation Age of Onset  . Heart disease Mother        Arrthymia   . Stroke Father        Deceased  . Dementia Father   . Heart disease Brother        Myocardial infarction  . Colon cancer Neg Hx      Allergies  Allergen Reactions  . Fentanyl Other (See Comments)    chills  . Prozac [Fluoxetine Hcl] Other (See Comments)    Extreme depression  . Statins Other (See Comments)    Flu  like symptoms     REVIEW OF SYSTEMS (Negative unless checked)  Constitutional: [] Weight loss  [] Fever  [] Chills Cardiac: [] Chest pain   [] Chest pressure   [] Palpitations   [] Shortness of breath when laying flat   [] Shortness of breath at rest   [] Shortness of breath with exertion. Vascular:  [x] Pain in legs with walking   [] Pain in legs at rest   [] Pain in legs when laying flat   [x] Claudication   [] Pain in feet when walking  [] Pain in feet at rest  [] Pain in feet when laying flat   [] History of DVT   [] Phlebitis   [] Swelling in legs   [] Varicose veins   [] Non-healing ulcers Pulmonary:   [] Uses home oxygen   [] Productive cough   [] Hemoptysis   [] Wheeze  [] COPD   [] Asthma Neurologic:  [] Dizziness  [] Blackouts   [x] Seizures   [] History of stroke   [] History of TIA  [] Aphasia   [] Temporary blindness   [] Dysphagia   [] Weakness or numbness in arms   [] Weakness or numbness in legs Musculoskeletal:  [] Arthritis   [] Joint swelling   [] Joint pain   [] Low back pain Hematologic:  [] Easy bruising  [] Easy bleeding   [] Hypercoagulable state   [] Anemic   Gastrointestinal:  [] Blood in stool   [] Vomiting blood  [x] Gastroesophageal reflux/heartburn   [] Abdominal pain Genitourinary:  [] Chronic kidney disease   [] Difficult urination  [] Frequent urination  [] Burning with urination   [] Hematuria Skin:  [] Rashes   [] Ulcers   [] Wounds Psychological:  [] History of anxiety   []  History of major depression.  Physical Examination  BP (!) 157/88 (BP Location: Right Arm)   Pulse 89   Resp 16   Wt 214 lb 9.6 oz (97.3 kg)   BMI 26.12 kg/m  Gen:  WD/WN, NAD Head: [place]/AT, No temporalis wasting. Ear/Nose/Throat: Hearing grossly intact, nares w/o erythema or drainage Eyes: Conjunctiva clear. Sclera non-icteric Neck: Supple.  Trachea midline Pulmonary:  Good air movement, no use of accessory muscles.  Cardiac: RRR, no JVD Vascular:  Vessel Right Left  Radial Palpable Palpable                          PT Palpable  Palpable  DP Palpable Palpable   Gastrointestinal: soft, non-tender/non-distended. No guarding/reflex.  Musculoskeletal: M/S 5/5 throughout.  No deformity or atrophy.  No edema. Neurologic: Sensation grossly intact in extremities.  Symmetrical.  Speech is fluent.  Psychiatric: Judgment intact, Mood & affect appropriate for pt's clinical situation. Dermatologic: No rashes or ulcers noted.  No cellulitis or open wounds.  Labs Recent Results (from the past 2160 hour(s))  Hemoglobin A1c     Status: None   Collection Time: 03/07/20  8:56 AM  Result Value Ref Range   Hgb A1c MFr Bld 5.9 4.6 - 6.5 %    Comment: Glycemic Control Guidelines for People with Diabetes:Non Diabetic:  <6%Goal of Therapy: <7%Additional Action Suggested:  >8%   PSA, Medicare     Status: None   Collection Time: 03/07/20  8:56 AM  Result Value Ref Range   PSA 0.98 0.10 - 4.00 ng/ml    Comment: Test performed using Access Hybritech PSA Assay, a parmagnetic partical, chemiluminecent immunoassay.  CBC     Status: Abnormal   Collection Time: 03/07/20  8:56 AM  Result Value Ref Range   WBC 11.4 (H) 4.0 - 10.5 K/uL   RBC 4.39 4.22 - 5.81 Mil/uL   Platelets 265.0 150 - 400 K/uL   Hemoglobin 13.6 13.0 - 17.0 g/dL   HCT 40.4 39 - 52 %   MCV 92.0 78.0 - 100.0 fl   MCHC 33.7 30.0 - 36.0 g/dL   RDW 14.1 11.5 - 15.5 %  Comprehensive metabolic panel     Status: None   Collection Time: 03/07/20  8:56 AM  Result Value Ref Range   Sodium 142 135 - 145 mEq/L   Potassium 4.4 3.5 - 5.1 mEq/L   Chloride 102 96 - 112 mEq/L   CO2 31 19 - 32 mEq/L   Glucose, Bld 96 70 - 99 mg/dL   BUN 7 6 - 23 mg/dL   Creatinine, Ser 1.15 0.40 - 1.50 mg/dL   Total Bilirubin 0.5 0.2 - 1.2 mg/dL   Alkaline Phosphatase 93 39 - 117 U/L   AST 18 0 - 37 U/L   ALT 20 0 - 53 U/L   Total Protein 6.5 6.0 - 8.3 g/dL   Albumin 4.0 3.5 - 5.2 g/dL   GFR 62.67 >60.00 mL/min   Calcium 9.5 8.4 - 10.5 mg/dL  Lipid panel     Status: Abnormal    Collection Time: 03/07/20  8:56 AM  Result Value Ref Range   Cholesterol 237 (H) 0 - 200 mg/dL    Comment: ATP III Classification       Desirable:  < 200 mg/dL               Borderline High:  200 - 239 mg/dL          High:  > = 240 mg/dL   Triglycerides 245.0 (H) 0 - 149 mg/dL    Comment: Normal:  <150 mg/dLBorderline High:  150 - 199 mg/dL   HDL 41.30 >39.00 mg/dL   VLDL 49.0 (H) 0.0 - 40.0 mg/dL   Total CHOL/HDL Ratio 6     Comment:                Men          Women1/2 Average Risk     3.4          3.3Average Risk          5.0          4.42X Average Risk          9.6          7.13X Average Risk          15.0          11.0  NonHDL 195.39     Comment: NOTE:  Non-HDL goal should be 30 mg/dL higher than patient's LDL goal (i.e. LDL goal of < 70 mg/dL, would have non-HDL goal of < 100 mg/dL)  LDL cholesterol, direct     Status: None   Collection Time: 03/07/20  8:56 AM  Result Value Ref Range   Direct LDL 154.0 mg/dL    Comment: Optimal:  <100 mg/dLNear or Above Optimal:  100-129 mg/dLBorderline High:  130-159 mg/dLHigh:  160-189 mg/dLVery High:  >190 mg/dL  Cologuard     Status: Abnormal   Collection Time: 03/25/20 12:00 AM  Result Value Ref Range   Cologuard Positive (A) Negative    Comment: [employer]  SARS CORONAVIRUS 2 (TAT 6-24 HRS) Nasopharyngeal Nasopharyngeal Swab     Status: None   Collection Time: 04/08/20 11:09 AM   Specimen: Nasopharyngeal Swab  Result Value Ref Range   SARS Coronavirus 2 NEGATIVE NEGATIVE    Comment: (NOTE) SARS-CoV-2 target nucleic acids are NOT DETECTED.  The SARS-CoV-2 RNA is generally detectable in upper and lower respiratory specimens during the acute phase of infection. Negative results do not preclude SARS-CoV-2 infection, do not rule out co-infections with other pathogens, and should not be used as the sole basis for treatment or other patient management decisions. Negative results must be combined with clinical  observations, patient history, and epidemiological information. The expected result is Negative.  Fact Sheet for Patients: SugarRoll.be  Fact Sheet for Healthcare Providers: https://www.woods-mathews.com/  This test is not yet approved or cleared by the [place] FDA and  has been authorized for detection and/or diagnosis of SARS-CoV-2 by FDA under an Emergency Use Authorization (EUA). This EUA will remain  in effect (meaning this test can be used) for the duration of the COVID-19 declaration under Se ction 564(b)(1) of the Act, 21 U.S.C. section 360bbb-3(b)(1), unless the authorization is terminated or revoked sooner.  Performed at [facility], [place] [address], [place], [place] [address]   Heparin level (unfractionated) every 6 hours x 4 post-procedure     Status: Abnormal   Collection Time: 04/11/20  6:59 PM  Result Value Ref Range   Heparin Unfractionated 0.73 (H) 0.30 - 0.70 IU/mL    Comment: (NOTE) If heparin results are below expected values, and patient dosage has  been confirmed, suggest follow up testing of antithrombin III levels. Performed at [facility], [address] [place], [place] [address]   CBC every 6 hours x 4 post-procedure     Status: Abnormal   Collection Time: 04/11/20  6:59 PM  Result Value Ref Range   WBC 14.0 (H) 4.0 - 10.5 K/uL   RBC 4.35 4.22 - 5.81 MIL/uL   Hemoglobin 13.6 13.0 - 17.0 g/dL   HCT 40.5 39 - 52 %   MCV 93.1 80.0 - 100.0 fL   MCH 31.3 26.0 - 34.0 pg   MCHC 33.6 30.0 - 36.0 g/dL   RDW 13.1 11.5 - 15.5 %   Platelets 229 150 - 400 K/uL   nRBC 0.0 0.0 - 0.2 %    Comment: Performed at [facility], [address] [place], [place] [address]  Fibrinogen every 6 hours x 4 post-procedure     Status: None   Collection Time: 04/11/20  6:59 PM  Result Value Ref Range   Fibrinogen 321 210 - 475 mg/dL    Comment: Performed at [facility], [address], [place], [place] [address]  CBC  Status: Abnormal   Collection Time: 04/12/20  1:36 AM  Result Value Ref Range   WBC 17.6 (H) 4.0 - 10.5 K/uL   RBC 4.19 (L) 4.22 - 5.81 MIL/uL   Hemoglobin 13.2 13.0 - 17.0 g/dL   HCT 39.4 39 - 52 %   MCV 94.0 80.0 - 100.0 fL   MCH 31.5 26.0 - 34.0 pg   MCHC 33.5 30.0 - 36.0 g/dL   RDW 13.0 11.5 - 15.5 %   Platelets 178 150 - 400 K/uL   nRBC 0.0 0.0 - 0.2 %    Comment: Performed at [facility], [address] [place], [place] [address]  Fibrinogen     Status: Abnormal   Collection Time: 04/12/20  1:36 AM  Result Value Ref Range   Fibrinogen 150 (L) 210 - 475 mg/dL    Comment: Performed at [facility], [address] [place], [place] [address]  CBC     Status: Abnormal   Collection Time: 04/12/20  7:01 AM  Result Value Ref Range   WBC 16.1 (H) 4.0 - 10.5 K/uL   RBC 4.20 (L) 4.22 - 5.81 MIL/uL   Hemoglobin 13.1 13.0 - 17.0 g/dL   HCT 38.0 (L) 39 - 52 %   MCV 90.5 80.0 - 100.0 fL   MCH 31.2 26.0 - 34.0 pg   MCHC 34.5 30.0 - 36.0 g/dL   RDW 13.2 11.5 - 15.5 %   Platelets 157 150 - 400 K/uL   nRBC 0.0 0.0 - 0.2 %    Comment: Performed at [facility], [address] [place], [place] [address]  Fibrinogen     Status: Abnormal   Collection Time: 04/12/20  7:01 AM  Result Value Ref Range   Fibrinogen 147 (L) 210 - 475 mg/dL    Comment: Performed at [facility], [address] [place], [place] [address]  CBC     Status: Abnormal   Collection Time: 04/13/20  7:23 AM  Result Value Ref Range   WBC 10.8 (H) 4.0 - 10.5 K/uL   RBC 4.01 (L) 4.22 - 5.81 MIL/uL   Hemoglobin 12.7 (L) 13.0 - 17.0 g/dL   HCT 37.1 (L) 39 - 52 %   MCV 92.5 80.0 - 100.0 fL   MCH 31.7 26.0 - 34.0 pg   MCHC 34.2 30.0 - 36.0 g/dL   RDW 13.5 11.5 - 15.5 %   Platelets 146 (L) 150 - 400 K/uL   nRBC 0.0 0.0 - 0.2 %    Comment: Performed at [facility], [address] [place], [place] [address]  Basic metabolic panel      Status: Abnormal   Collection Time: 04/13/20  7:23 AM  Result Value Ref Range   Sodium 139 135 - 145 mmol/L   Potassium 4.5 3.5 - 5.1 mmol/L   Chloride 102 98 - 111 mmol/L   CO2 30 22 - 32 mmol/L   Glucose, Bld 114 (H) 70 - 99 mg/dL    Comment: Glucose reference range applies only to samples taken after fasting for at least 8 hours.   BUN 10 8 - 23 mg/dL   Creatinine, Ser 1.08 0.61 - 1.24 mg/dL   Calcium 8.6 (L) 8.9 - 10.3 mg/dL   GFR calc non Af Amer >60 >60 mL/min   GFR calc Af Amer >60 >60 mL/min   Anion gap 7 5 - 15    Comment: Performed at [facility] [address] [place], [place]  [address]  Magnesium     Status: None   Collection Time: 04/13/20  7:23 AM  Result Value Ref Range   Magnesium 2.1 1.7 - 2.4 mg/dL    Comment: Performed at [facility], [address] [place], [place] [address]    Radiology PERIPHERAL VASCULAR CATHETERIZATION  Result Date: 04/12/2020 See op note  PERIPHERAL VASCULAR CATHETERIZATION  Result Date: 04/11/2020 See op note  VAS [place] ABI WITH/WO TBI  Result Date: 04/15/2020 LOWER EXTREMITY DOPPLER STUDY Indications: Claudication, rest pain, and peripheral artery disease.  Vascular Interventions: 05/02/17: Right EIA PTA with left distal SFA-proximal                         popliteal artery stent;                         11/16/2019: Right EIA angioplasty, left SFA/popliteal                         thrombectomy/stent & left PTA angioplasty;. Performing Technologist: Blondell Reveal RT, RDMS, RVT  Examination Guidelines: A complete evaluation includes at minimum, Doppler waveform signals and systolic blood pressure reading at the level of bilateral brachial, anterior tibial, and posterior tibial arteries, when vessel segments are accessible. Bilateral testing is considered an integral part of a complete examination. Photoelectric Plethysmograph (PPG) waveforms and toe systolic pressure readings are included as required and additional  duplex testing as needed. Limited examinations for reoccurring indications may be performed as noted.  ABI Findings: +---------+------------------+-----+---------+--------+ Right    Rt Pressure (mmHg)IndexWaveform Comment  +---------+------------------+-----+---------+--------+ Brachial 163                                      +---------+------------------+-----+---------+--------+ ATA      178               1.09 triphasic         +---------+------------------+-----+---------+--------+ PTA      177               1.09 triphasic         +---------+------------------+-----+---------+--------+ Great Toe136               0.83 Normal            +---------+------------------+-----+---------+--------+ +---------+------------------+-----+---------------------------------+---------+ Left     Lt Pressure (mmHg)IndexWaveform                         Comment   +---------+------------------+-----+---------------------------------+---------+ Brachial 160                                                               +---------+------------------+-----+---------------------------------+---------+ CFA                             triphasic                        by duplex +---------+------------------+-----+---------------------------------+---------+ Popliteal  prx=occluded; distal=dampened    by duplex                                 monophasic                                 +---------+------------------+-----+---------------------------------+---------+ ATA                             not detected                               +---------+------------------+-----+---------------------------------+---------+ PTA      77                0.47 dampened monophasic                        +---------+------------------+-----+---------------------------------+---------+ PERO                            not detected                                +---------+------------------+-----+---------------------------------+---------+ Great Toe19                0.12 dampened                                   +---------+------------------+-----+---------------------------------+---------+ +-------+-----------+-----------+------------+------------+ ABI/TBIToday's ABIToday's TBIPrevious ABIPrevious TBI +-------+-----------+-----------+------------+------------+ Right  1.09       0.83       1.13        1.0          +-------+-----------+-----------+------------+------------+ Left   0.47       0.12       1.07        1.09         +-------+-----------+-----------+------------+------------+ Limited duplex demonstrated no flow detected in the left proximal SFA to proximal popliteal artery stent, distal ATA or distal peroneal artery.  Left ABIs and TBIs appear decreased compared to prior study on 03/15/20. Right ABIs and TBIs appear essentially unchanged.  Summary: Right: Resting right ankle-brachial index is within normal range. No evidence of significant right lower extremity arterial disease. The right toe-brachial index is normal. Left: Resting left ankle-brachial index indicates severe left lower extremity arterial disease. The left toe-brachial index is abnormal.  *See table(s) above for measurements and observations.  Electronically signed by Leotis Pain MD on 04/15/2020 at 8:15:33 AM.    Final     Assessment/Plan  Hyperlipidemia lipid control important in reducing the progression of atherosclerotic disease. Continue statin therapy   Prediabetes blood glucose control important in reducing the progression of atherosclerotic disease. Also, involved in wound healing. On appropriate medications.   Atherosclerosis of native arteries of extremity with intermittent claudication (HCC) ABIs today are in the normal range at 1.0 bilaterally with strong waveforms. He underwent extensive revascularization 2 weeks ago and seems to be doing well.   Continue Eliquis.  He has stopped smoking and is congratulated on this.  Return to clinic in 3 months with noninvasive studies or sooner if problem develop in the interim.  Leotis Pain, MD  04/29/2020 12:38 PM    This note was created with Dragon medical transcription system.  Any errors from dictation are purely unintentional

## 2020-04-30 MED ORDER — BUDESONIDE-FORMOTEROL FUMARATE 160-4.5 MCG/ACT IN AERO: 2.0000 | INHALATION_SPRAY | Freq: Two times a day (BID) | RESPIRATORY_TRACT | 5 refills | Status: DC

## 2020-04-30 NOTE — Telephone Encounter (Signed)
Noted, Rx for Symbicort sent to pharmacy

## 2020-05-02 ENCOUNTER — Encounter: Payer: Self-pay | Admitting: *Deleted

## 2020-05-03 DIAGNOSIS — M5136 Other intervertebral disc degeneration, lumbar region: Secondary | ICD-10-CM | POA: Diagnosis not present

## 2020-05-03 DIAGNOSIS — M9905 Segmental and somatic dysfunction of pelvic region: Secondary | ICD-10-CM | POA: Diagnosis not present

## 2020-05-03 DIAGNOSIS — M9903 Segmental and somatic dysfunction of lumbar region: Secondary | ICD-10-CM | POA: Diagnosis not present

## 2020-05-03 DIAGNOSIS — M5416 Radiculopathy, lumbar region: Secondary | ICD-10-CM | POA: Diagnosis not present

## 2020-05-12 ENCOUNTER — Telehealth (INDEPENDENT_AMBULATORY_CARE_PROVIDER_SITE_OTHER): Payer: Self-pay | Admitting: Gastroenterology

## 2020-05-12 DIAGNOSIS — Z1211 Encounter for screening for malignant neoplasm of colon: Secondary | ICD-10-CM

## 2020-05-12 NOTE — Progress Notes (Signed)
Gastroenterology Pre-Procedure Review  Request Date: Not Scheduled at this time due to recently started blood thinner Eliquis in September. Referral has been noted of this and I advised patient that we will call him back next year once he has been on Eliquis longer.  PATIENT REVIEW QUESTIONS: The patient responded to the following health history questions as indicated:    1. Are you having any GI issues? no 2. Do you have a personal history of Polyps? yes (03/23/15 performed at [facility] noted multiple polyps.) 3. Do you have a family history of Colon Cancer or Polyps? no 4. Diabetes Mellitus? no 5. Joint replacements in the past 12 months?no 6. Major health problems in the past 3 months?yes (Blood Clot in September 2021) 7. Any artificial heart valves, MVP, or defibrillator?no    MEDICATIONS & ALLERGIES:    Patient reports the following regarding taking any anticoagulation/antiplatelet therapy:   Plavix, Coumadin, Eliquis, Xarelto, Lovenox, Pradaxa, Brilinta, or Effient? yes (Eliquis just started in September.) Aspirin? yes (81 mg daily)  Patient confirms/reports the following medications:  Current Outpatient Medications  Medication Sig Dispense Refill  . albuterol (VENTOLIN HFA) 108 (90 Base) MCG/ACT inhaler Inhale 2 puffs into the lungs every 6 (six) hours as needed for shortness of breath. 8 g 0  . apixaban (ELIQUIS) 5 MG TABS tablet Take 1 tablet (5 mg total) by mouth 2 (two) times daily. 180 tablet 3  . aspirin EC 81 MG EC tablet Take 1 tablet (81 mg total) by mouth daily. 90 tablet 3  . budesonide-formoterol (SYMBICORT) 160-4.5 MCG/ACT inhaler Inhale 2 puffs into the lungs 2 (two) times daily. 1 each 5  . diphenhydrAMINE (BENADRYL) 25 MG tablet Take 25 mg by mouth daily as needed for itching.     . esomeprazole (NEXIUM) 20 MG capsule Take 20 mg by mouth daily as needed (heartburn).     . ezetimibe (ZETIA) 10 MG tablet TAKE 1 TABLET (10 MG TOTAL) BY MOUTH DAILY. FOR  CHOLESTEROL. 90 tablet 0  . HYDROcodone-acetaminophen (NORCO/VICODIN) 5-325 MG tablet Take 1 tablet by mouth every 6 (six) hours as needed for moderate pain. 10 tablet 0  . ibuprofen (ADVIL) 200 MG tablet Take 200-400 mg by mouth every 6 (six) hours as needed for fever or mild pain.     . Multiple Vitamins-Minerals (MULTIVITAMIN WITH MINERALS) tablet Take 1 tablet by mouth daily.    Marland Kitchen ADVAIR DISKUS 250-50 MCG/DOSE AEPB      No current facility-administered medications for this visit.    Patient confirms/reports the following allergies:  Allergies  Allergen Reactions  . Fentanyl Other (See Comments)    chills  . Prozac [Fluoxetine Hcl] Other (See Comments)    Extreme depression  . Statins Other (See Comments)    Flu like symptoms    No orders of the defined types were placed in this encounter.   AUTHORIZATION INFORMATION Primary Insurance: 1D#: Group #:  Secondary Insurance: 1D#: Group #:  SCHEDULE INFORMATION: Date: Not Scheduled at this time due to recently started Eliquis in September Time: Location:

## 2020-05-31 DIAGNOSIS — M9903 Segmental and somatic dysfunction of lumbar region: Secondary | ICD-10-CM | POA: Diagnosis not present

## 2020-05-31 DIAGNOSIS — M9905 Segmental and somatic dysfunction of pelvic region: Secondary | ICD-10-CM | POA: Diagnosis not present

## 2020-05-31 DIAGNOSIS — M5416 Radiculopathy, lumbar region: Secondary | ICD-10-CM | POA: Diagnosis not present

## 2020-05-31 DIAGNOSIS — M5136 Other intervertebral disc degeneration, lumbar region: Secondary | ICD-10-CM | POA: Diagnosis not present

## 2020-06-24 ENCOUNTER — Encounter (INDEPENDENT_AMBULATORY_CARE_PROVIDER_SITE_OTHER): Payer: Medicare Other

## 2020-06-24 ENCOUNTER — Ambulatory Visit (INDEPENDENT_AMBULATORY_CARE_PROVIDER_SITE_OTHER): Payer: Medicare Other | Admitting: Vascular Surgery

## 2020-06-25 ENCOUNTER — Other Ambulatory Visit: Payer: Self-pay | Admitting: Primary Care

## 2020-06-25 DIAGNOSIS — E785 Hyperlipidemia, unspecified: Secondary | ICD-10-CM

## 2020-06-28 DIAGNOSIS — M9905 Segmental and somatic dysfunction of pelvic region: Secondary | ICD-10-CM | POA: Diagnosis not present

## 2020-06-28 DIAGNOSIS — M5136 Other intervertebral disc degeneration, lumbar region: Secondary | ICD-10-CM | POA: Diagnosis not present

## 2020-06-28 DIAGNOSIS — M9903 Segmental and somatic dysfunction of lumbar region: Secondary | ICD-10-CM | POA: Diagnosis not present

## 2020-06-28 DIAGNOSIS — M5416 Radiculopathy, lumbar region: Secondary | ICD-10-CM | POA: Diagnosis not present

## 2020-07-26 DIAGNOSIS — M5416 Radiculopathy, lumbar region: Secondary | ICD-10-CM | POA: Diagnosis not present

## 2020-07-26 DIAGNOSIS — M9903 Segmental and somatic dysfunction of lumbar region: Secondary | ICD-10-CM | POA: Diagnosis not present

## 2020-07-26 DIAGNOSIS — M5136 Other intervertebral disc degeneration, lumbar region: Secondary | ICD-10-CM | POA: Diagnosis not present

## 2020-07-26 DIAGNOSIS — M9905 Segmental and somatic dysfunction of pelvic region: Secondary | ICD-10-CM | POA: Diagnosis not present

## 2020-08-02 ENCOUNTER — Ambulatory Visit (INDEPENDENT_AMBULATORY_CARE_PROVIDER_SITE_OTHER): Payer: Medicare Other | Admitting: Vascular Surgery

## 2020-08-02 ENCOUNTER — Ambulatory Visit (INDEPENDENT_AMBULATORY_CARE_PROVIDER_SITE_OTHER): Payer: Medicare Other

## 2020-08-02 ENCOUNTER — Encounter (INDEPENDENT_AMBULATORY_CARE_PROVIDER_SITE_OTHER): Payer: Self-pay | Admitting: Vascular Surgery

## 2020-08-02 ENCOUNTER — Other Ambulatory Visit: Payer: Self-pay

## 2020-08-02 VITALS — BP 167/94 | HR 87 | Ht 76.0 in | Wt 216.0 lb

## 2020-08-02 DIAGNOSIS — E785 Hyperlipidemia, unspecified: Secondary | ICD-10-CM

## 2020-08-02 DIAGNOSIS — I70212 Atherosclerosis of native arteries of extremities with intermittent claudication, left leg: Secondary | ICD-10-CM | POA: Diagnosis not present

## 2020-08-02 DIAGNOSIS — R7303 Prediabetes: Secondary | ICD-10-CM | POA: Diagnosis not present

## 2020-08-02 NOTE — Progress Notes (Signed)
MRN : [id number]  Nathaniel [name] is a 70 y.o. (June 04, 1951) male who presents with chief complaint of  Chief Complaint  Patient presents with  . Follow-up    U/S  .  History of Present Illness: Patient returns today in follow up of his PAD.  He is about 3 months status post extensive left lower extremity revascularization for repeat thrombosis.  He has had multiple interventions on that left leg as well as the right external iliac artery.  He is not having any leg pain.  He had to stop his cholesterol medicine due to side effects.  He remains on aspirin and Eliquis.  His ABIs today are 1.12 on the right and 1.22 on the left with brisk triphasic waveforms and normal digital pressures bilaterally.  Current Outpatient Medications  Medication Sig Dispense Refill  . ADVAIR DISKUS 250-50 MCG/DOSE AEPB     . albuterol (VENTOLIN HFA) 108 (90 Base) MCG/ACT inhaler Inhale 2 puffs into the lungs every 6 (six) hours as needed for shortness of breath. 8 g 0  . apixaban (ELIQUIS) 5 MG TABS tablet Take 1 tablet (5 mg total) by mouth 2 (two) times daily. 180 tablet 3  . aspirin EC 81 MG EC tablet Take 1 tablet (81 mg total) by mouth daily. 90 tablet 3  . budesonide-formoterol (SYMBICORT) 160-4.5 MCG/ACT inhaler Inhale 2 puffs into the lungs 2 (two) times daily. 1 each 5  . diphenhydrAMINE (BENADRYL) 25 MG tablet Take 25 mg by mouth daily as needed for itching.     . esomeprazole (NEXIUM) 20 MG capsule Take 20 mg by mouth daily as needed (heartburn).     Marland Kitchen HYDROcodone-acetaminophen (NORCO/VICODIN) 5-325 MG tablet Take 1 tablet by mouth every 6 (six) hours as needed for moderate pain. 10 tablet 0  . ibuprofen (ADVIL) 200 MG tablet Take 200-400 mg by mouth every 6 (six) hours as needed for fever or mild pain.     . Multiple Vitamins-Minerals (MULTIVITAMIN WITH MINERALS) tablet Take 1 tablet by mouth daily.    Marland Kitchen ezetimibe (ZETIA) 10 MG tablet TAKE 1 TABLET (10 MG TOTAL) BY MOUTH DAILY. FOR CHOLESTEROL.  (Patient not taking: Reported on 08/02/2020) 90 tablet 2   No current facility-administered medications for this visit.    Past Medical History:  Diagnosis Date  . Cataract   . Chicken pox   . GERD (gastroesophageal reflux disease)   . Hyperlipidemia   . PAD (peripheral artery disease) ([place])   . Peripheral vascular disease ([place])   . Pre-diabetes   . Seizures ([place])    None since age 71    Past Surgical History:  Procedure Laterality Date  . BACK SURGERY    . LAMINECTOMY  1987  . LOWER EXTREMITY ANGIOGRAPHY Left 05/02/2017   Procedure: Lower Extremity Angiography;  Surgeon: Algernon Huxley, MD;  Location: [facility];  Service: Cardiovascular;  Laterality: Left;  . LOWER EXTREMITY ANGIOGRAPHY Left 11/16/2019   Procedure: LOWER EXTREMITY ANGIOGRAPHY;  Surgeon: Algernon Huxley, MD;  Location: [facility];  Service: Cardiovascular;  Laterality: Left;  . LOWER EXTREMITY ANGIOGRAPHY Left 04/11/2020   Procedure: LOWER EXTREMITY ANGIOGRAPHY;  Surgeon: Algernon Huxley, MD;  Location: [facility];  Service: Cardiovascular;  Laterality: Left;  . LOWER EXTREMITY ANGIOGRAPHY Left 04/12/2020   Procedure: Lower Extremity Angiography;  Surgeon: Algernon Huxley, MD;  Location: [facility];  Service: Cardiovascular;  Laterality: Left;  . TONSILLECTOMY  Social History   Tobacco Use  . Smoking status: Former Smoker    Packs/day: 1.00    Years: 50.00    Pack years: 50.00    Types: Cigarettes    Quit date: 11/21/2019    Years since quitting: 0.6  . Smokeless tobacco: Never Used  Vaping Use  . Vaping Use: Never used  Substance Use Topics  . Alcohol use: Not Currently    Alcohol/week: 0.0 standard drinks  . Drug use: No     Family History  Problem Relation Age of Onset  . Heart disease Mother        Arrthymia   . Stroke Father        Deceased  . Dementia Father   . Heart disease Brother        Myocardial infarction  . Colon cancer Neg Hx     Allergies   Allergen Reactions  . Fentanyl Other (See Comments)    chills  . Prozac [Fluoxetine Hcl] Other (See Comments)    Extreme depression  . Statins Other (See Comments)    Flu like symptoms     REVIEW OF SYSTEMS (Negative unless checked)  Constitutional: [] ?Weight loss  [] ?Fever  [] ?Chills Cardiac: [] ?Chest pain   [] ?Chest pressure   [] ?Palpitations   [] ?Shortness of breath when laying flat   [] ?Shortness of breath at rest   [] ?Shortness of breath with exertion. Vascular:  [x] ?Pain in legs with walking   [] ?Pain in legs at rest   [] ?Pain in legs when laying flat   [x] ?Claudication   [] ?Pain in feet when walking  [] ?Pain in feet at rest  [] ?Pain in feet when laying flat   [] ?History of DVT   [] ?Phlebitis   [] ?Swelling in legs   [] ?Varicose veins   [] ?Non-healing ulcers Pulmonary:   [] ?Uses home oxygen   [] ?Productive cough   [] ?Hemoptysis   [] ?Wheeze  [] ?COPD   [] ?Asthma Neurologic:  [] ?Dizziness  [] ?Blackouts   [x] ?Seizures   [] ?History of stroke   [] ?History of TIA  [] ?Aphasia   [] ?Temporary blindness   [] ?Dysphagia   [] ?Weakness or numbness in arms   [] ?Weakness or numbness in legs Musculoskeletal:  [] ?Arthritis   [] ?Joint swelling   [] ?Joint pain   [] ?Low back pain Hematologic:  [] ?Easy bruising  [] ?Easy bleeding   [] ?Hypercoagulable state   [] ?Anemic   Gastrointestinal:  [] ?Blood in stool   [] ?Vomiting blood  [x] ?Gastroesophageal reflux/heartburn   [] ?Abdominal pain Genitourinary:  [] ?Chronic kidney disease   [] ?Difficult urination  [] ?Frequent urination  [] ?Burning with urination   [] ?Hematuria Skin:  [] ?Rashes   [] ?Ulcers   [] ?Wounds Psychological:  [] ?History of anxiety   [] ? History of major depression.  Physical Examination  BP (!) 167/94   Pulse 87   Ht 6\' 4"  (1.93 m)   Wt 216 lb (98 kg)   BMI 26.29 kg/m  Gen:  WD/WN, NAD Head: [place]/AT, No temporalis wasting. Ear/Nose/Throat: Hearing grossly intact, nares w/o erythema or drainage Eyes: Conjunctiva clear. Sclera  non-icteric Neck: Supple.  Trachea midline Pulmonary:  Good air movement, no use of accessory muscles.  Cardiac: RRR, no JVD Vascular:  Vessel Right Left  Radial Palpable Palpable                          PT Palpable Palpable  DP Palpable Palpable   Gastrointestinal: soft, non-tender/non-distended. No guarding/reflex.  Musculoskeletal: M/S 5/5 throughout.  No deformity or atrophy. Mild LLE edema. Neurologic: Sensation grossly intact in extremities.  Symmetrical.  Speech is fluent.  Psychiatric: Judgment intact, Mood & affect appropriate for pt's clinical situation. Dermatologic: No rashes or ulcers noted.  No cellulitis or open wounds.       Labs No results found for this or any previous visit (from the past 2160 hour(s)).  Radiology No results found.  Assessment/Plan Hyperlipidemia lipid control important in reducing the progression of atherosclerotic disease. Continue statin therapy   Prediabetes blood glucose control important in reducing the progression of atherosclerotic disease. Also, involved in wound healing. On appropriate medications.  Atherosclerosis of native arteries of extremity with intermittent claudication (HCC) His ABIs today are 1.12 on the right and 1.22 on the left with brisk triphasic waveforms and normal digital pressures bilaterally. Overall doing well.  Intolerant to statins.  Continue Eliquis and aspirin.  Recheck in 6 months    Leotis Pain, MD  08/02/2020 10:47 AM    This note was created with Dragon medical transcription system.  Any errors from dictation are purely unintentional

## 2020-08-02 NOTE — Assessment & Plan Note (Signed)
His ABIs today are 1.12 on the right and 1.22 on the left with brisk triphasic waveforms and normal digital pressures bilaterally. Overall doing well.  Intolerant to statins.  Continue Eliquis and aspirin.  Recheck in 6 months

## 2020-08-23 DIAGNOSIS — M5136 Other intervertebral disc degeneration, lumbar region: Secondary | ICD-10-CM | POA: Diagnosis not present

## 2020-08-23 DIAGNOSIS — M9905 Segmental and somatic dysfunction of pelvic region: Secondary | ICD-10-CM | POA: Diagnosis not present

## 2020-08-23 DIAGNOSIS — M9903 Segmental and somatic dysfunction of lumbar region: Secondary | ICD-10-CM | POA: Diagnosis not present

## 2020-08-23 DIAGNOSIS — M5416 Radiculopathy, lumbar region: Secondary | ICD-10-CM | POA: Diagnosis not present

## 2020-09-13 ENCOUNTER — Encounter (INDEPENDENT_AMBULATORY_CARE_PROVIDER_SITE_OTHER): Payer: Medicare Other

## 2020-09-13 ENCOUNTER — Ambulatory Visit (INDEPENDENT_AMBULATORY_CARE_PROVIDER_SITE_OTHER): Payer: Medicare Other | Admitting: Vascular Surgery

## 2020-09-20 DIAGNOSIS — M9905 Segmental and somatic dysfunction of pelvic region: Secondary | ICD-10-CM | POA: Diagnosis not present

## 2020-09-20 DIAGNOSIS — M9903 Segmental and somatic dysfunction of lumbar region: Secondary | ICD-10-CM | POA: Diagnosis not present

## 2020-09-20 DIAGNOSIS — M5416 Radiculopathy, lumbar region: Secondary | ICD-10-CM | POA: Diagnosis not present

## 2020-09-20 DIAGNOSIS — M5136 Other intervertebral disc degeneration, lumbar region: Secondary | ICD-10-CM | POA: Diagnosis not present

## 2020-10-18 DIAGNOSIS — M5136 Other intervertebral disc degeneration, lumbar region: Secondary | ICD-10-CM | POA: Diagnosis not present

## 2020-10-18 DIAGNOSIS — M5416 Radiculopathy, lumbar region: Secondary | ICD-10-CM | POA: Diagnosis not present

## 2020-10-18 DIAGNOSIS — M9903 Segmental and somatic dysfunction of lumbar region: Secondary | ICD-10-CM | POA: Diagnosis not present

## 2020-10-18 DIAGNOSIS — M9905 Segmental and somatic dysfunction of pelvic region: Secondary | ICD-10-CM | POA: Diagnosis not present

## 2020-11-14 DIAGNOSIS — U071 COVID-19: Secondary | ICD-10-CM | POA: Diagnosis not present

## 2020-11-14 DIAGNOSIS — R058 Other specified cough: Secondary | ICD-10-CM | POA: Diagnosis not present

## 2020-11-14 DIAGNOSIS — R070 Pain in throat: Secondary | ICD-10-CM | POA: Diagnosis not present

## 2020-11-14 DIAGNOSIS — Z8709 Personal history of other diseases of the respiratory system: Secondary | ICD-10-CM | POA: Diagnosis not present

## 2020-11-14 DIAGNOSIS — Z03818 Encounter for observation for suspected exposure to other biological agents ruled out: Secondary | ICD-10-CM | POA: Diagnosis not present

## 2020-12-13 DIAGNOSIS — M9905 Segmental and somatic dysfunction of pelvic region: Secondary | ICD-10-CM | POA: Diagnosis not present

## 2020-12-13 DIAGNOSIS — M9903 Segmental and somatic dysfunction of lumbar region: Secondary | ICD-10-CM | POA: Diagnosis not present

## 2020-12-13 DIAGNOSIS — M5416 Radiculopathy, lumbar region: Secondary | ICD-10-CM | POA: Diagnosis not present

## 2020-12-13 DIAGNOSIS — M5136 Other intervertebral disc degeneration, lumbar region: Secondary | ICD-10-CM | POA: Diagnosis not present

## 2020-12-26 ENCOUNTER — Other Ambulatory Visit (INDEPENDENT_AMBULATORY_CARE_PROVIDER_SITE_OTHER): Payer: Self-pay | Admitting: Vascular Surgery

## 2021-01-06 ENCOUNTER — Other Ambulatory Visit (INDEPENDENT_AMBULATORY_CARE_PROVIDER_SITE_OTHER): Payer: Self-pay | Admitting: Vascular Surgery

## 2021-01-06 DIAGNOSIS — I70212 Atherosclerosis of native arteries of extremities with intermittent claudication, left leg: Secondary | ICD-10-CM

## 2021-01-06 DIAGNOSIS — Z95828 Presence of other vascular implants and grafts: Secondary | ICD-10-CM

## 2021-01-09 DIAGNOSIS — M9905 Segmental and somatic dysfunction of pelvic region: Secondary | ICD-10-CM | POA: Diagnosis not present

## 2021-01-09 DIAGNOSIS — M5136 Other intervertebral disc degeneration, lumbar region: Secondary | ICD-10-CM | POA: Diagnosis not present

## 2021-01-09 DIAGNOSIS — M5416 Radiculopathy, lumbar region: Secondary | ICD-10-CM | POA: Diagnosis not present

## 2021-01-09 DIAGNOSIS — M9903 Segmental and somatic dysfunction of lumbar region: Secondary | ICD-10-CM | POA: Diagnosis not present

## 2021-01-10 ENCOUNTER — Other Ambulatory Visit: Payer: Self-pay

## 2021-01-10 ENCOUNTER — Ambulatory Visit (INDEPENDENT_AMBULATORY_CARE_PROVIDER_SITE_OTHER): Payer: Medicare Other | Admitting: Nurse Practitioner

## 2021-01-10 ENCOUNTER — Ambulatory Visit (INDEPENDENT_AMBULATORY_CARE_PROVIDER_SITE_OTHER): Payer: Medicare Other

## 2021-01-10 VITALS — BP 163/95 | HR 71 | Ht 76.0 in | Wt 210.0 lb

## 2021-01-10 DIAGNOSIS — Z720 Tobacco use: Secondary | ICD-10-CM | POA: Diagnosis not present

## 2021-01-10 DIAGNOSIS — I70212 Atherosclerosis of native arteries of extremities with intermittent claudication, left leg: Secondary | ICD-10-CM

## 2021-01-10 DIAGNOSIS — E785 Hyperlipidemia, unspecified: Secondary | ICD-10-CM | POA: Diagnosis not present

## 2021-01-10 DIAGNOSIS — Z95828 Presence of other vascular implants and grafts: Secondary | ICD-10-CM

## 2021-01-10 DIAGNOSIS — R0600 Dyspnea, unspecified: Secondary | ICD-10-CM | POA: Diagnosis not present

## 2021-01-10 DIAGNOSIS — I6529 Occlusion and stenosis of unspecified carotid artery: Secondary | ICD-10-CM

## 2021-01-10 DIAGNOSIS — R0609 Other forms of dyspnea: Secondary | ICD-10-CM

## 2021-01-12 ENCOUNTER — Telehealth: Payer: Self-pay

## 2021-01-12 ENCOUNTER — Other Ambulatory Visit: Payer: Self-pay

## 2021-01-12 DIAGNOSIS — Z1211 Encounter for screening for malignant neoplasm of colon: Secondary | ICD-10-CM

## 2021-01-12 MED ORDER — PEG 3350-KCL-NA BICARB-NACL 420 G PO SOLR: ORAL | 0 refills | Status: DC

## 2021-01-12 NOTE — Telephone Encounter (Signed)
Blood thinner (Eliquis) request. Awaiting on response.

## 2021-01-12 NOTE — Telephone Encounter (Signed)
Go ahead and schedule procedure, no office visit

## 2021-01-17 ENCOUNTER — Other Ambulatory Visit: Payer: Self-pay

## 2021-01-17 ENCOUNTER — Ambulatory Visit (INDEPENDENT_AMBULATORY_CARE_PROVIDER_SITE_OTHER): Payer: Medicare Other

## 2021-01-17 ENCOUNTER — Encounter (INDEPENDENT_AMBULATORY_CARE_PROVIDER_SITE_OTHER): Payer: Self-pay | Admitting: Nurse Practitioner

## 2021-01-17 ENCOUNTER — Ambulatory Visit (INDEPENDENT_AMBULATORY_CARE_PROVIDER_SITE_OTHER): Payer: Medicare Other | Admitting: Nurse Practitioner

## 2021-01-17 ENCOUNTER — Other Ambulatory Visit (INDEPENDENT_AMBULATORY_CARE_PROVIDER_SITE_OTHER): Payer: Self-pay | Admitting: Vascular Surgery

## 2021-01-17 VITALS — BP 160/87 | HR 74 | Resp 16 | Wt 213.6 lb

## 2021-01-17 DIAGNOSIS — I679 Cerebrovascular disease, unspecified: Secondary | ICD-10-CM

## 2021-01-17 DIAGNOSIS — Z720 Tobacco use: Secondary | ICD-10-CM | POA: Diagnosis not present

## 2021-01-17 DIAGNOSIS — E785 Hyperlipidemia, unspecified: Secondary | ICD-10-CM | POA: Diagnosis not present

## 2021-01-17 DIAGNOSIS — I6529 Occlusion and stenosis of unspecified carotid artery: Secondary | ICD-10-CM

## 2021-01-17 DIAGNOSIS — I70212 Atherosclerosis of native arteries of extremities with intermittent claudication, left leg: Secondary | ICD-10-CM | POA: Diagnosis not present

## 2021-01-17 NOTE — Progress Notes (Signed)
Subjective:    Patient ID: Nathaniel [name], male    DOB: 1950/09/11, 70 y.o.   MRN: [id number] Chief Complaint  Patient presents with   Follow-up    6 mo [place]    The patient returns to the office for followup and review of the noninvasive studies. There have been no interval changes in lower extremity symptoms. No interval shortening of the patient's claudication distance or development of rest pain symptoms. No new ulcers or wounds have occurred since the last visit.  Ther patient has had COVID and notes that he has been experiencing worsening fatigue and shortness of breath with exertion.  He also endorses having some lightheadedness at times.  The patient denies amaurosis fugax or recent TIA symptoms. There are no recent neurological changes noted. The patient denies history of DVT, PE or superficial thrombophlebitis. The patient denies recent episodes of angina or shortness of breath.   ABI Rt=1.14 and Lt=1.04  (previous ABI's Rt=1.12 and Lt=1.22) Duplex ultrasound of the bilateral lower extremities reveals biphasic/triphasic waveforms throughout.   Review of Systems  Constitutional:  Positive for fatigue.  Respiratory:  Positive for shortness of breath.   Neurological:  Positive for light-headedness.  All other systems reviewed and are negative.     Objective:   Physical Exam Vitals reviewed.  HENT:     Head: Normocephalic.  Cardiovascular:     Rate and Rhythm: Normal rate.     Pulses: Normal pulses.  Pulmonary:     Effort: Pulmonary effort is normal.  Neurological:     Mental Status: He is alert and oriented to person, place, and time.  Psychiatric:        Mood and Affect: Mood normal.        Behavior: Behavior normal.        Thought Content: Thought content normal.        Judgment: Judgment normal.    BP (!) 163/95   Pulse 71   Ht 6\' 4"  (1.93 m)   Wt 210 lb (95.3 kg)   BMI 25.56 kg/m   Past Medical History:  Diagnosis Date   Cataract    Chicken pox     GERD (gastroesophageal reflux disease)    Hyperlipidemia    PAD (peripheral artery disease) (HCC)    Peripheral vascular disease (HCC)    Pre-diabetes    Seizures (HCC)    None since age 59    Social History   Socioeconomic History   Marital status: Married    Spouse name: Not on file   Number of children: Not on file   Years of education: Not on file   Highest education level: Not on file  Occupational History   Not on file  Tobacco Use   Smoking status: Former    Packs/day: 1.00    Years: 50.00    Pack years: 50.00    Types: Cigarettes    Quit date: 11/21/2019    Years since quitting: 1.1   Smokeless tobacco: Never  Vaping Use   Vaping Use: Never used  Substance and Sexual Activity   Alcohol use: Not Currently    Alcohol/week: 0.0 standard drinks   Drug use: No   Sexual activity: Not on file  Other Topics Concern   Not on file  Social History Narrative   Retired.   Stays busy with household work, Surveyor, mining.   Married for 3 years.   Grown children.   Enjoys reading.    Social Determinants of Health  Financial Resource Strain: Low Risk    Difficulty of Paying Living Expenses: Not hard at all  Food Insecurity: No Food Insecurity   Worried About Charity fundraiser in the Last Year: Never true   Ran Out of Food in the Last Year: Never true  Transportation Needs: No Transportation Needs   Lack of Transportation (Medical): No   Lack of Transportation (Non-Medical): No  Physical Activity: Inactive   Days of Exercise per Week: 0 days   Minutes of Exercise per Session: 0 min  Stress: No Stress Concern Present   Feeling of Stress : Not at all  Social Connections: Not on file  Intimate Partner Violence: Not At Risk   Fear of Current or Ex-Partner: No   Emotionally Abused: No   Physically Abused: No   Sexually Abused: No    Past Surgical History:  Procedure Laterality Date   [place] Left  05/02/2017   Procedure: Lower Extremity Angiography;  Surgeon: Algernon Huxley, MD;  Location: [facility];  Service: Cardiovascular;  Laterality: Left;   LOWER EXTREMITY ANGIOGRAPHY Left 11/16/2019   Procedure: LOWER EXTREMITY ANGIOGRAPHY;  Surgeon: Algernon Huxley, MD;  Location: [facility];  Service: Cardiovascular;  Laterality: Left;   LOWER EXTREMITY ANGIOGRAPHY Left 04/11/2020   Procedure: LOWER EXTREMITY ANGIOGRAPHY;  Surgeon: Algernon Huxley, MD;  Location: [facility];  Service: Cardiovascular;  Laterality: Left;   LOWER EXTREMITY ANGIOGRAPHY Left 04/12/2020   Procedure: Lower Extremity Angiography;  Surgeon: Algernon Huxley, MD;  Location: [facility];  Service: Cardiovascular;  Laterality: Left;   TONSILLECTOMY      Family History  Problem Relation Age of Onset   Heart disease Mother        Arrthymia    Stroke Father        Deceased   Dementia Father    Heart disease Brother        Myocardial infarction   Colon cancer Neg Hx     Allergies  Allergen Reactions   Fentanyl Other (See Comments)    chills   Prozac [Fluoxetine Hcl] Other (See Comments)    Extreme depression   Statins Other (See Comments)    Flu like symptoms    CBC Latest Ref Rng & Units 04/13/2020 04/12/2020 04/12/2020  WBC 4.0 - 10.5 K/uL 10.8(H) 16.1(H) 17.6(H)  Hemoglobin 13.0 - 17.0 g/dL 12.7(L) 13.1 13.2  Hematocrit 39.0 - 52.0 % 37.1(L) 38.0(L) 39.4  Platelets 150 - 400 K/uL 146(L) 157 178      CMP     Component Value Date/Time   NA 139 04/13/2020 0723   K 4.5 04/13/2020 0723   CL 102 04/13/2020 0723   CO2 30 04/13/2020 0723   GLUCOSE 114 (H) 04/13/2020 0723   BUN 10 04/13/2020 0723   CREATININE 1.08 04/13/2020 0723   CALCIUM 8.6 (L) 04/13/2020 0723   PROT 6.5 03/07/2020 0856   ALBUMIN 4.0 03/07/2020 0856   AST 18 03/07/2020 0856   ALT 20 03/07/2020 0856   ALKPHOS 93 03/07/2020 0856   BILITOT 0.5 03/07/2020 0856   GFRNONAA >60 04/13/2020 0723   GFRAA >60  04/13/2020 0723     VAS [place] ABI WITH/WO TBI  Result Date: 01/13/2021  LOWER EXTREMITY DOPPLER STUDY Patient Name:  JOAS MOTTON  Date of Exam:   01/10/2021 Medical Rec #: [id number]  Accession #:    [id number] Date of Birth: [birth year]             Patient Gender: M Patient Age:   78Y Exam Location:  [facility] Procedure:      VAS [place] ABI WITH/WO TBI Referring Phys: [id number] [place] --------------------------------------------------------------------------------  Indications: Claudication, rest pain, and peripheral artery disease.  Vascular Interventions: 05/02/17: Right EIA PTA with left distal SFA-proximal                         popliteal artery stent;                         11/16/2019: Right EIA angioplasty, left SFA/popliteal                         thrombectomy/stent & left PTA angioplasty;                         9/20-21/2021 left pta thrombectomy new stent. Comparison Study: 08/02/2020 Performing Technologist: Almira Coaster RVS  Examination Guidelines: A complete evaluation includes at minimum, Doppler waveform signals and systolic blood pressure reading at the level of bilateral brachial, anterior tibial, and posterior tibial arteries, when vessel segments are accessible. Bilateral testing is considered an integral part of a complete examination. Photoelectric Plethysmograph (PPG) waveforms and toe systolic pressure readings are included as required and additional duplex testing as needed. Limited examinations for reoccurring indications may be performed as noted.  ABI Findings: +---------+------------------+-----+--------+--------+ Right    Rt Pressure (mmHg)IndexWaveformComment  +---------+------------------+-----+--------+--------+ Brachial 187                                     +---------+------------------+-----+--------+--------+ ATA      196               1.05 biphasic         +---------+------------------+-----+--------+--------+ PTA      213                1.14 biphasic         +---------+------------------+-----+--------+--------+ Great Toe210               1.12 Normal           +---------+------------------+-----+--------+--------+ +---------+------------------+-----+--------+-------+ Left     Lt Pressure (mmHg)IndexWaveformComment +---------+------------------+-----+--------+-------+ Brachial 181                                    +---------+------------------+-----+--------+-------+ ATA      129               0.69 biphasic        +---------+------------------+-----+--------+-------+ PTA      195               1.04 biphasic        +---------+------------------+-----+--------+-------+ Great Toe181               0.97 Normal          +---------+------------------+-----+--------+-------+ +-------+-----------+-----------+------------+------------+ ABI/TBIToday's ABIToday's TBIPrevious ABIPrevious TBI +-------+-----------+-----------+------------+------------+ Right  1.14       1.12       1.12        1.05         +-------+-----------+-----------+------------+------------+ Left  1.04       .97        1.22        1.03         +-------+-----------+-----------+------------+------------+ Bilateral ABIs appear essentially unchanged compared to prior study on 08/02/2020. Bilateral TBIs appear essentially unchanged compared to prior study on 08/02/2020.  Summary: Right: Resting right ankle-brachial index is within normal range. No evidence of significant right lower extremity arterial disease. The right toe-brachial index is normal. Left: Resting left ankle-brachial index is within normal range. No evidence of significant left lower extremity arterial disease. The left toe-brachial index is normal.  *See table(s) above for measurements and observations.  Electronically signed by Leotis Pain MD on 01/13/2021 at 11:45:57 AM.    Final        Assessment & Plan:   1. Atherosclerosis of native artery of left  lower extremity with intermittent claudication (HCC)  Recommend:  The patient has evidence of atherosclerosis of the lower extremities with claudication.  The patient does not voice lifestyle limiting changes at this point in time.  Noninvasive studies do not suggest clinically significant change.  No invasive studies, angiography or surgery at this time The patient should continue walking and begin a more formal exercise program.  The patient should continue antiplatelet therapy and aggressive treatment of the lipid abnormalities  No changes in the patient's medications at this time  The patient should continue wearing graduated compression socks 10-15 mmHg strength to control the mild edema.    2. 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  This may account for his lightheadedness.  We will have the patient return to the office for follow-up carotid evaluation to better assess the status of the subclavian arteries.  3. Hyperlipidemia, unspecified hyperlipidemia type Continue statin as ordered and reviewed, no changes at this time   4. Tobacco abuse Smoking cessation was discussed, 3-10 minutes spent on this topic specifically   5. Dyspnea on exertion The patient has has recently had COVID.  He endorses having worsening shortness of breath on exertion that is slowing his daily activities.  Given the cardiac component it is reasonable to evaluate - Ambulatory referral to Cardiology   Current Outpatient Medications on File Prior to Visit  Medication Sig Dispense Refill   albuterol (VENTOLIN HFA) 108 (90 Base) MCG/ACT inhaler Inhale 2 puffs into the lungs every 6 (six) hours as needed for shortness of breath. 8 g 0   apixaban (ELIQUIS) 5 MG TABS tablet Take 1 tablet (5 mg total) by mouth 2 (two) times daily. 180 tablet 3   aspirin 81 MG EC tablet Take by mouth.     ASPIRIN LOW DOSE 81 MG EC tablet TAKE 1 TABLET  BY MOUTH EVERY DAY 90 tablet 3   budesonide-formoterol (SYMBICORT) 160-4.5 MCG/ACT inhaler Inhale 2 puffs into the lungs 2 (two) times daily. 1 each 5   diphenhydrAMINE (BENADRYL) 25 MG tablet Take 25 mg by mouth daily as needed for itching.      esomeprazole (NEXIUM) 20 MG capsule Take 20 mg by mouth daily as needed (heartburn).      ezetimibe (ZETIA) 10 MG tablet TAKE 1 TABLET (10 MG TOTAL) BY MOUTH DAILY. FOR CHOLESTEROL. 90 tablet 2   ibuprofen (ADVIL) 200 MG tablet Take 200-400 mg by mouth every 6 (six) hours as needed for fever or mild pain.      Multiple Vitamins-Minerals (MULTIVITAMIN WITH MINERALS) tablet Take 1 tablet by  mouth daily.     ADVAIR DISKUS 250-50 MCG/DOSE AEPB      HYDROcodone-acetaminophen (NORCO/VICODIN) 5-325 MG tablet Take 1 tablet by mouth every 6 (six) hours as needed for moderate pain. 10 tablet 0   No current facility-administered medications on file prior to visit.    There are no Patient Instructions on file for this visit. No follow-ups on file.   Kris Hartmann, NP

## 2021-01-25 ENCOUNTER — Encounter (INDEPENDENT_AMBULATORY_CARE_PROVIDER_SITE_OTHER): Payer: Self-pay | Admitting: Nurse Practitioner

## 2021-01-25 NOTE — Progress Notes (Signed)
Subjective:    Patient ID: Nathaniel [name], male    DOB: [birth year], 70 y.o.   MRN: [id number] Chief Complaint  Patient presents with   Follow-up    Ultrasound follow up    Nathaniel [name] is a 70 year old male that returns today for follow-up evaluation for possible causes of recent lightheadedness.  The patient previously had an elevated high-grade subclavian stenosis.  The patient notes fatigue and lightheadedness following his recent bout with COVID.  He notes that the lightheadedness is occasional but the fatigue is what actually causes him issues.  He still currently denies any issues with his lower extremities.  There has been a recent referral for cardiology for further evaluation of possible causes of fatigue and shortness of breath.  Previous noninvasive studies on 03/21/2020 showed 1 to 39% stenosis of the right carotid normal in the left carotid.  However the right subclavian was noted to be stenotic with turbulent flow.  Today noninvasive study showed only 39% internal carotid artery stenosis bilaterally.  The left subclavian artery is stenotic although the velocities are consistent with previous studies.  The right clavian artery had normal flow hemodynamics.   Review of Systems  Constitutional:  Positive for fatigue.  Respiratory:  Positive for shortness of breath.   Neurological:  Positive for light-headedness.  All other systems reviewed and are negative.     Objective:   Physical Exam Vitals reviewed.  HENT:     Head: Normocephalic.  Neck:     Vascular: No carotid bruit.  Cardiovascular:     Rate and Rhythm: Normal rate.     Pulses: Normal pulses.  Pulmonary:     Effort: Pulmonary effort is normal.  Skin:    General: Skin is warm and dry.  Neurological:     Mental Status: He is alert and oriented to person, place, and time.  Psychiatric:        Mood and Affect: Mood normal.        Behavior: Behavior normal.        Thought Content: Thought content  normal.        Judgment: Judgment normal.    BP (!) 160/87 (BP Location: Left Arm)   Pulse 74   Resp 16   Wt 213 lb 9.6 oz (96.9 kg)   BMI 26.00 kg/m   Past Medical History:  Diagnosis Date   Cataract    Chicken pox    GERD (gastroesophageal reflux disease)    Hyperlipidemia    PAD (peripheral artery disease) (HCC)    Peripheral vascular disease (HCC)    Pre-diabetes    Seizures (HCC)    None since age 29    Social History   Socioeconomic History   Marital status: Married    Spouse name: Not on file   Number of children: Not on file   Years of education: Not on file   Highest education level: Not on file  Occupational History   Not on file  Tobacco Use   Smoking status: Former    Packs/day: 1.00    Years: 50.00    Pack years: 50.00    Types: Cigarettes    Quit date: 11/21/2019    Years since quitting: 1.1   Smokeless tobacco: Never  Vaping Use   Vaping Use: Never used  Substance and Sexual Activity   Alcohol use: Not Currently    Alcohol/week: 0.0 standard drinks   Drug use: No   Sexual activity: Not on file  Other Topics  Concern   Not on file  Social History Narrative   Retired.   Stays busy with household work, Surveyor, mining.   Married for 3 years.   Grown children.   Enjoys reading.    Social Determinants of Health   Financial Resource Strain: Low Risk    Difficulty of Paying Living Expenses: Not hard at all  Food Insecurity: No Food Insecurity   Worried About Charity fundraiser in the Last Year: Never true   [place] in the Last Year: Never true  Transportation Needs: No Transportation Needs   Lack of Transportation (Medical): No   Lack of Transportation (Non-Medical): No  Physical Activity: Inactive   Days of Exercise per Week: 0 days   Minutes of Exercise per Session: 0 min  Stress: No Stress Concern Present   Feeling of Stress : Not at all  Social Connections: Not on file  Intimate Partner Violence: Not At Risk   Fear of Current  or Ex-Partner: No   Emotionally Abused: No   Physically Abused: No   Sexually Abused: No    Past Surgical History:  Procedure Laterality Date   [place] Left 05/02/2017   Procedure: Lower Extremity Angiography;  Surgeon: Algernon Huxley, MD;  Location: [facility];  Service: Cardiovascular;  Laterality: Left;   LOWER EXTREMITY ANGIOGRAPHY Left 11/16/2019   Procedure: LOWER EXTREMITY ANGIOGRAPHY;  Surgeon: Algernon Huxley, MD;  Location: [place] [facility];  Service: Cardiovascular;  Laterality: Left;   LOWER EXTREMITY ANGIOGRAPHY Left 04/11/2020   Procedure: LOWER EXTREMITY ANGIOGRAPHY;  Surgeon: Algernon Huxley, MD;  Location: [place] [facility];  Service: Cardiovascular;  Laterality: Left;   LOWER EXTREMITY ANGIOGRAPHY Left 04/12/2020   Procedure: Lower Extremity Angiography;  Surgeon: Algernon Huxley, MD;  Location: [place] [facility];  Service: Cardiovascular;  Laterality: Left;   TONSILLECTOMY      Family History  Problem Relation Age of Onset   Heart disease Mother        Arrthymia    Stroke Father        Deceased   Dementia Father    Heart disease Brother        Myocardial infarction   Colon cancer Neg Hx     Allergies  Allergen Reactions   Fentanyl Other (See Comments)    chills   Prozac [Fluoxetine Hcl] Other (See Comments)    Extreme depression   Statins Other (See Comments)    Flu like symptoms    CBC Latest Ref Rng & Units 04/13/2020 04/12/2020 04/12/2020  WBC 4.0 - 10.5 K/uL 10.8(H) 16.1(H) 17.6(H)  Hemoglobin 13.0 - 17.0 g/dL 12.7(L) 13.1 13.2  Hematocrit 39.0 - 52.0 % 37.1(L) 38.0(L) 39.4  Platelets 150 - 400 K/uL 146(L) 157 178      CMP     Component Value Date/Time   NA 139 04/13/2020 0723   K 4.5 04/13/2020 0723   CL 102 04/13/2020 0723   CO2 30 04/13/2020 0723   GLUCOSE 114 (H) 04/13/2020 0723   BUN 10 04/13/2020 0723   CREATININE 1.08 04/13/2020 0723   CALCIUM 8.6 (L) 04/13/2020 0723    PROT 6.5 03/07/2020 0856   ALBUMIN 4.0 03/07/2020 0856   AST 18 03/07/2020 0856   ALT 20 03/07/2020 0856   ALKPHOS 93 03/07/2020 0856   BILITOT 0.5 03/07/2020 0856   GFRNONAA >60 04/13/2020 0723   GFRAA >  60 04/13/2020 0723     VAS [place] ABI WITH/WO TBI  Result Date: 01/13/2021  LOWER EXTREMITY DOPPLER STUDY Patient Name:  Nathaniel [name]  Date of Exam:   01/10/2021 Medical Rec #: [id number]            Accession #:    [id number] Date of Birth: August 28, 1950             Patient Gender: M Patient Age:   1Y Exam Location:  [facility] Procedure:      VAS [place] ABI WITH/WO TBI Referring Phys: [id number] [place] --------------------------------------------------------------------------------  Indications: Claudication, rest pain, and peripheral artery disease.  Vascular Interventions: 05/02/17: Right EIA PTA with left distal SFA-proximal                         popliteal artery stent;                         11/16/2019: Right EIA angioplasty, left SFA/popliteal                         thrombectomy/stent & left PTA angioplasty;                         9/20-21/2021 left pta thrombectomy new stent. Comparison Study: 08/02/2020 Performing Technologist: Almira Coaster RVS  Examination Guidelines: A complete evaluation includes at minimum, Doppler waveform signals and systolic blood pressure reading at the level of bilateral brachial, anterior tibial, and posterior tibial arteries, when vessel segments are accessible. Bilateral testing is considered an integral part of a complete examination. Photoelectric Plethysmograph (PPG) waveforms and toe systolic pressure readings are included as required and additional duplex testing as needed. Limited examinations for reoccurring indications may be performed as noted.  ABI Findings: +---------+------------------+-----+--------+--------+ Right    Rt Pressure (mmHg)IndexWaveformComment  +---------+------------------+-----+--------+--------+ Brachial 187                                      +---------+------------------+-----+--------+--------+ ATA      196               1.05 biphasic         +---------+------------------+-----+--------+--------+ PTA      213               1.14 biphasic         +---------+------------------+-----+--------+--------+ Great Toe210               1.12 Normal           +---------+------------------+-----+--------+--------+ +---------+------------------+-----+--------+-------+ Left     Lt Pressure (mmHg)IndexWaveformComment +---------+------------------+-----+--------+-------+ Brachial 181                                    +---------+------------------+-----+--------+-------+ ATA      129               0.69 biphasic        +---------+------------------+-----+--------+-------+ PTA      195               1.04 biphasic        +---------+------------------+-----+--------+-------+ Great Toe181               0.97 Normal          +---------+------------------+-----+--------+-------+ +-------+-----------+-----------+------------+------------+  ABI/TBIToday's ABIToday's TBIPrevious ABIPrevious TBI +-------+-----------+-----------+------------+------------+ Right  1.14       1.12       1.12        1.05         +-------+-----------+-----------+------------+------------+ Left   1.04       .97        1.22        1.03         +-------+-----------+-----------+------------+------------+ Bilateral ABIs appear essentially unchanged compared to prior study on 08/02/2020. Bilateral TBIs appear essentially unchanged compared to prior study on 08/02/2020.  Summary: Right: Resting right ankle-brachial index is within normal range. No evidence of significant right lower extremity arterial disease. The right toe-brachial index is normal. Left: Resting left ankle-brachial index is within normal range. No evidence of significant left lower extremity arterial disease. The left toe-brachial index is  normal.  *See table(s) above for measurements and observations.  Electronically signed by Leotis Pain MD on 01/13/2021 at 11:45:57 AM.    Final        Assessment & Plan:   1. 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  We will plan on having patient follow-up with ABIs in approximately 1 year as long as there are no further symptoms.  2. Stenosis of carotid artery, unspecified laterality Currently the patient's lightheadedness is not the greatest platelet issue.  Currently the stenosis that is noted is not critical in the subclavian artery so we will have the patient return in 3 months for reevaluation.  We will allow cardiology to work-up to find if there is a larger cause to his symptoms.  If I discussed with patient possible diagnostic angiogram to better assess the subclavian given the conflicting results seen today.  3. Hyperlipidemia, unspecified hyperlipidemia type Continue statin as ordered and reviewed, no changes at this time   4. Tobacco abuse Patient continues to abstain.  He is encouraged   Current Outpatient Medications on File Prior to Visit  Medication Sig Dispense Refill   albuterol (VENTOLIN HFA) 108 (90 Base) MCG/ACT inhaler Inhale 2 puffs into the lungs every 6 (six) hours as needed for shortness of breath. 8 g 0   apixaban (ELIQUIS) 5 MG TABS tablet Take 1 tablet (5 mg total) by mouth 2 (two) times daily. 180 tablet 3   aspirin 81 MG EC tablet Take by mouth.     ASPIRIN LOW DOSE 81 MG EC tablet TAKE 1 TABLET BY MOUTH EVERY DAY 90 tablet 3   budesonide-formoterol (SYMBICORT) 160-4.5 MCG/ACT inhaler Inhale 2 puffs into the lungs 2 (two) times daily. 1 each 5   diphenhydrAMINE (BENADRYL) 25 MG tablet Take 25 mg by mouth daily as needed for itching.      esomeprazole (NEXIUM) 20 MG capsule Take 20 mg by mouth daily as needed (heartburn).      ezetimibe (ZETIA) 10 MG tablet TAKE 1 TABLET (10  MG TOTAL) BY MOUTH DAILY. FOR CHOLESTEROL. 90 tablet 2   ibuprofen (ADVIL) 200 MG tablet Take 200-400 mg by mouth every 6 (six) hours as needed for fever or mild pain.      Multiple Vitamins-Minerals (MULTIVITAMIN WITH MINERALS) tablet Take 1 tablet by mouth daily.     polyethylene glycol-electrolytes (NULYTELY) 420 g solution Prepare according to package instructions. Starting at 5:00 PM: Drink one 8 oz glass of mixture every 15 minutes until you finish half of the jug. Five hours prior to procedure, drink 8 oz glass  of mixture every 15 minutes until it is all gone. Make sure you do not drink anything 4 hours prior to your procedure. 4000 mL 0   ADVAIR DISKUS 250-50 MCG/DOSE AEPB      HYDROcodone-acetaminophen (NORCO/VICODIN) 5-325 MG tablet Take 1 tablet by mouth every 6 (six) hours as needed for moderate pain. 10 tablet 0   No current facility-administered medications on file prior to visit.    There are no Patient Instructions on file for this visit. No follow-ups on file.   Kris Hartmann, NP

## 2021-01-27 ENCOUNTER — Other Ambulatory Visit: Payer: Self-pay

## 2021-01-27 ENCOUNTER — Ambulatory Visit (INDEPENDENT_AMBULATORY_CARE_PROVIDER_SITE_OTHER): Payer: Medicare Other | Admitting: Cardiovascular Disease

## 2021-01-27 ENCOUNTER — Encounter: Payer: Self-pay | Admitting: Cardiovascular Disease

## 2021-01-27 VITALS — BP 160/98 | HR 70 | Ht 76.0 in | Wt 216.4 lb

## 2021-01-27 DIAGNOSIS — I6529 Occlusion and stenosis of unspecified carotid artery: Secondary | ICD-10-CM

## 2021-01-27 DIAGNOSIS — J432 Centrilobular emphysema: Secondary | ICD-10-CM | POA: Diagnosis not present

## 2021-01-27 DIAGNOSIS — R0789 Other chest pain: Secondary | ICD-10-CM | POA: Diagnosis not present

## 2021-01-27 DIAGNOSIS — Z720 Tobacco use: Secondary | ICD-10-CM | POA: Diagnosis not present

## 2021-01-27 DIAGNOSIS — I739 Peripheral vascular disease, unspecified: Secondary | ICD-10-CM

## 2021-01-27 DIAGNOSIS — E785 Hyperlipidemia, unspecified: Secondary | ICD-10-CM | POA: Diagnosis not present

## 2021-01-27 DIAGNOSIS — I70212 Atherosclerosis of native arteries of extremities with intermittent claudication, left leg: Secondary | ICD-10-CM

## 2021-01-27 NOTE — Progress Notes (Signed)
Cardiology Office Note  Date:  01/27/2021   ID:  Nathaniel [name], DOB 1951-05-11, MRN [id number]  PCP:  Pleas Koch, NP   Chief Complaint  Patient presents with   Fatigue    Patient c/o muscle fatigue. Medications reviewed by the patient verbally.     HPI:  [name] is a 70 year old gentleman with past medical history of PAD, carotid stenosis, subclavian stenosis With Dr. Lucky [name]: status post extensive left lower extremity revascularization for repeat thrombosis.  He has had multiple interventions on that left leg as well as the right external iliac artery. Smoker, COPD Hyperlipidemia COVID April 2022 (and 2019) Hypertension DVT on eliquis, recurrent Referred by vascular surgery, Nathaniel [name] for new patient evaluation of his shortness of breath, recent COVID, hyperlipidemia  Long history of smoking, reports chronic shortness of breath on exertion Legs give out and is not able to walk very far  Feels his shortness of breath is stable Uses inhaler PRN  Claudication sx, vascular surgery, worse with walking  Discussed prior imaging 02/2020: chest CT, images pulled up and reviewed Minimal aortic athero, no significant coronary calcification noted  noninvasive studies on 03/21/2020 showed 1 to 39% stenosis of the right carotid normal in the left carotid.  However the right subclavian was noted to be stenotic with turbulent flow.  noninvasive study 6/22 showed only 39% internal carotid artery stenosis bilaterally.  The left subclavian artery is stenotic although the velocities are consistent with previous studies.  The right clavian artery had normal flow hemodynamics.  Prefers not to add additional medications at this time  Total cholesterol 237 LDL 154  EKG personally reviewed by myself on todays visit Normal sinus rhythm rate 70 bpm no significant ST-T wave changes  PMH:   has a past medical history of Cataract, Chicken pox, GERD (gastroesophageal reflux  disease), Hyperlipidemia, PAD (peripheral artery disease) ([place]), Peripheral vascular disease ([place]), Pre-diabetes, and Seizures ([place]).  PSH:    Past Surgical History:  Procedure Laterality Date   BACK SURGERY     LAMINECTOMY  1987   LOWER EXTREMITY ANGIOGRAPHY Left 05/02/2017   Procedure: Lower Extremity Angiography;  Surgeon: Algernon Huxley, MD;  Location: [place] [facility];  Service: Cardiovascular;  Laterality: Left;   LOWER EXTREMITY ANGIOGRAPHY Left 11/16/2019   Procedure: LOWER EXTREMITY ANGIOGRAPHY;  Surgeon: Algernon Huxley, MD;  Location: [place] [facility];  Service: Cardiovascular;  Laterality: Left;   LOWER EXTREMITY ANGIOGRAPHY Left 04/11/2020   Procedure: LOWER EXTREMITY ANGIOGRAPHY;  Surgeon: Algernon Huxley, MD;  Location: [place] [facility];  Service: Cardiovascular;  Laterality: Left;   LOWER EXTREMITY ANGIOGRAPHY Left 04/12/2020   Procedure: Lower Extremity Angiography;  Surgeon: Algernon Huxley, MD;  Location: [place] [facility];  Service: Cardiovascular;  Laterality: Left;   TONSILLECTOMY      Current Outpatient Medications  Medication Sig Dispense Refill   albuterol (VENTOLIN HFA) 108 (90 Base) MCG/ACT inhaler Inhale 2 puffs into the lungs every 6 (six) hours as needed for shortness of breath. 8 g 0   apixaban (ELIQUIS) 5 MG TABS tablet Take 1 tablet (5 mg total) by mouth 2 (two) times daily. 180 tablet 3   ASPIRIN LOW DOSE 81 MG EC tablet TAKE 1 TABLET BY MOUTH EVERY DAY 90 tablet 3   budesonide-formoterol (SYMBICORT) 160-4.5 MCG/ACT inhaler Inhale 2 puffs into the lungs 2 (two) times daily. 1 each 5   diphenhydrAMINE (BENADRYL) 25 MG tablet Take 25 mg by mouth daily as  needed for itching.      esomeprazole (NEXIUM) 20 MG capsule Take 20 mg by mouth daily as needed (heartburn).      ibuprofen (ADVIL) 200 MG tablet Take 200-400 mg by mouth every 6 (six) hours as needed for fever or mild pain.      Multiple Vitamins-Minerals (MULTIVITAMIN WITH MINERALS) tablet Take 1 tablet  by mouth daily.     HYDROcodone-acetaminophen (NORCO/VICODIN) 5-325 MG tablet Take 1 tablet by mouth every 6 (six) hours as needed for moderate pain. 10 tablet 0   polyethylene glycol-electrolytes (NULYTELY) 420 g solution Prepare according to package instructions. Starting at 5:00 PM: Drink one 8 oz glass of mixture every 15 minutes until you finish half of the jug. Five hours prior to procedure, drink 8 oz glass of mixture every 15 minutes until it is all gone. Make sure you do not drink anything 4 hours prior to your procedure. (Patient not taking: Reported on 01/27/2021) 4000 mL 0   No current facility-administered medications for this visit.     Allergies:   Fentanyl, Prozac [fluoxetine hcl], and Statins   Social History:  The patient  reports that he quit smoking about 14 months ago. His smoking use included cigarettes. He has a 50.00 pack-year smoking history. He has never used smokeless tobacco. He reports previous alcohol use. He reports that he does not use drugs.   Family History:   family history includes Dementia in his father; Heart disease in his brother and mother; Stroke in his father.    Review of Systems: Review of Systems  Constitutional: Negative.   HENT: Negative.    Respiratory: Negative.    Cardiovascular: Negative.   Gastrointestinal: Negative.   Musculoskeletal: Negative.   Neurological: Negative.   Psychiatric/Behavioral: Negative.    All other systems reviewed and are negative.   PHYSICAL EXAM: VS:  BP (!) 160/98 (BP Location: Left Arm, Patient Position: Sitting, Cuff Size: Normal)   Pulse 70   Ht 6\' 4"  (1.93 m)   Wt 216 lb 6 oz (98.1 kg)   SpO2 98%   BMI 26.34 kg/m  , BMI Body mass index is 26.34 kg/m. Constitutional:  oriented to person, place, and time. No distress.  HENT:  Head: Grossly normal Eyes:  no discharge. No scleral icterus.  Neck: No JVD, no carotid bruits  Cardiovascular: Regular rate and rhythm, no murmurs  appreciated Pulmonary/Chest: Clear to auscultation bilaterally, no wheezes or rails Abdominal: Soft.  no distension.  no tenderness.  Musculoskeletal: Normal range of motion Neurological:  normal muscle tone. Coordination normal. No atrophy Skin: Skin warm and dry Psychiatric: normal affect, pleasant  Recent Labs: 03/07/2020: ALT 20 04/13/2020: BUN 10; Creatinine, Ser 1.08; Hemoglobin 12.7; Magnesium 2.1; Platelets 146; Potassium 4.5; Sodium 139    Lipid Panel Lab Results  Component Value Date   CHOL 237 (H) 03/07/2020   HDL 41.30 03/07/2020   LDLCALC 158 (H) 02/10/2018   TRIG 245.0 (H) 03/07/2020      Wt Readings from Last 3 Encounters:  01/27/21 216 lb 6 oz (98.1 kg)  01/17/21 213 lb 9.6 oz (96.9 kg)  01/10/21 210 lb (95.3 kg)     ASSESSMENT AND PLAN:  Problem List Items Addressed This Visit       Cardiology Problems   Atherosclerosis of native arteries of extremity with intermittent claudication ([place]) - Primary   Relevant Orders   EKG 12-Lead   Hyperlipidemia   Stenosis of carotid artery     Other  Tobacco abuse   Relevant Orders   EKG 12-Lead   Centrilobular emphysema (HCC)   Chest discomfort   Relevant Orders   EKG 12-Lead   Other Visit Diagnoses     PAD (peripheral artery disease) ([place])          Peripheral arterial disease Followed by Dr. Lucky [name], unable to walk very far without having to stop secondary to claudication Prior smoking history A1c at goal 5.9 With recommended cholesterol medication but reports statin myalgias, has tried 3 Side effects on Zetia, does not want to retry Declining PCSK9 inhibitor  COPD Uses inhaler as needed, long smoking history  Essential hypertension Reluctant to start medication at this time Potentially could add medication such as amlodipine or other calcium channel blocker for claudication symptoms Suggested he call us with blood pressures from home measurements changes COVID made of the following  Shortness of  breath Secondary to COPD, deconditioning, unable to exclude angina Reports he is limited by his leg claudication symptoms in his breathing Recommended he call us if symptoms get worse, discussed anginal symptoms to watch for   Total encounter time more than 60 minutes  Greater than 50% was spent in counseling and coordination of care with the patient    Signed, Esmond Plants, M.D., Ph.D. [place], [place]

## 2021-01-27 NOTE — Patient Instructions (Addendum)
Please monitor blood pressure Call with numbers Goal <140 on the top  Read about prealuent/repatha (injection cholesterol medication)  Check with your insurance company for cost as well  Can review online for patient assistance as well  If you get chest tightness/worsening short of breath, Call  the office, we could order stress test, echo  Medication Instructions:  No changes, please continue your current medications   If you need a refill on your cardiac medications before your next appointment, please call your pharmacy.   Lab work: No new labs needed  Testing/Procedures: No new testing needed   Follow-Up: At [facility], you and your health needs are our priority.  As part of our continuing mission to provide you with exceptional heart care, we have created designated Provider Care Teams.  These Care Teams include your primary Cardiologist (physician) and Advanced Practice Providers (APPs -  Physician Assistants and Nurse Practitioners) who all work together to provide you with the care you need, when you need it.  You will need a follow up appointment as needed  Providers on your designated Care Team:   Murray Hodgkins, NP Christell Faith, PA-C Marrianne Mood, PA-C [place], [place]  COVID-19 Vaccine Information can be found at: [email or web address].uk For questions related to vaccine distribution or appointments, please email [email or web address] or call [phone])[phone].

## 2021-02-01 ENCOUNTER — Telehealth: Payer: Self-pay | Admitting: Cardiovascular Disease

## 2021-02-01 NOTE — Telephone Encounter (Signed)
   Name: Nathaniel [name]  DOB: 29-May-1951  MRN: [id number]   Primary Cardiologist: None  Chart reviewed as part of pre-operative protocol coverage. Patient was contacted 02/01/2021 in reference to pre-operative risk assessment for pending surgery as outlined below.  EAGLE PITTA was last seen on 01/27/2021 by Dr. Rockey Situ.  Pt had extensive PAD and recurrent DVT on aspirin and Eliquis.  Today, patient denies angina.  He is able to complete 4 METS without chest pain.    Therefore, based on ACC/AHA guidelines, the patient would be at acceptable risk for the planned procedure without further cardiovascular testing.   Regarding his Eliquis and aspirin recommendations, please contact Dr. Bunnie Domino office, his vascular physician.    The patient was advised that if he develops new symptoms prior to surgery to contact our office to arrange for a follow-up visit, and he verbalized understanding.  I will route this recommendation to the requesting party via Epic fax function and remove from pre-op pool. Please call with questions.  Warren Lacy, PA-C 02/01/2021, 11:11 AM

## 2021-02-01 NOTE — Telephone Encounter (Signed)
   [facility] Pre-operative Risk Assessment    Patient Name: Nathaniel [name]  DOB: [birth year] MRN: [id number]  HEARTCARE STAFF:  - IMPORTANT!!!!!! Under Visit Info/Reason for Call, type in Other and utilize the format Clearance MM/DD/YY or Clearance TBD. Do not use dashes or single digits. - Please review there is not already an duplicate clearance open for this procedure. - If request is for dental extraction, please clarify the # of teeth to be extracted. - If the patient is currently at the dentist's office, call Pre-Op Callback Staff (MA/nurse) to input urgent request.  - If the patient is not currently in the dentist office, please route to the Pre-Op pool.  Request for surgical clearance:  What type of surgery is being performed? Colonoscopy  When is this surgery scheduled? 02-07-21  What type of clearance is required (medical clearance vs. Pharmacy clearance to hold med vs. Both)? both  Are there any medications that need to be held prior to surgery and how long? Please advise   Practice name and name of physician performing surgery? [facility] dr. Vicente Males   What is the office phone number? [phone]   7.   What is the office fax number? [phone]  8.   Anesthesia type (None, local, MAC, general) ? Not noted    Clarisse Gouge 02/01/2021, 10:34 AM  _________________________________________________________________   (provider comments below)

## 2021-02-06 ENCOUNTER — Encounter: Payer: Self-pay | Admitting: Gastroenterology

## 2021-02-07 ENCOUNTER — Ambulatory Visit
Admission: RE | Admit: 2021-02-07 | Discharge: 2021-02-07 | Disposition: A | Discharge status: Home / Self Care | Payer: Medicare Other | Attending: Gastroenterology | Admitting: Gastroenterology

## 2021-02-07 ENCOUNTER — Other Ambulatory Visit: Payer: Self-pay

## 2021-02-07 ENCOUNTER — Encounter: Payer: Self-pay | Admitting: Gastroenterology

## 2021-02-07 ENCOUNTER — Encounter
Admission: RE | Disposition: A | Discharge status: Home / Self Care | Payer: Self-pay | Source: Home / Self Care | Attending: Gastroenterology

## 2021-02-07 ENCOUNTER — Ambulatory Visit: Payer: Medicare Other | Admitting: Anesthesiology

## 2021-02-07 DIAGNOSIS — K641 Second degree hemorrhoids: Secondary | ICD-10-CM | POA: Insufficient documentation

## 2021-02-07 DIAGNOSIS — D123 Benign neoplasm of transverse colon: Secondary | ICD-10-CM | POA: Insufficient documentation

## 2021-02-07 DIAGNOSIS — K635 Polyp of colon: Secondary | ICD-10-CM

## 2021-02-07 DIAGNOSIS — Z87891 Personal history of nicotine dependence: Secondary | ICD-10-CM | POA: Insufficient documentation

## 2021-02-07 DIAGNOSIS — Z8249 Family history of ischemic heart disease and other diseases of the circulatory system: Secondary | ICD-10-CM | POA: Diagnosis not present

## 2021-02-07 DIAGNOSIS — K649 Unspecified hemorrhoids: Secondary | ICD-10-CM | POA: Diagnosis not present

## 2021-02-07 DIAGNOSIS — Z7901 Long term (current) use of anticoagulants: Secondary | ICD-10-CM | POA: Diagnosis not present

## 2021-02-07 DIAGNOSIS — D125 Benign neoplasm of sigmoid colon: Secondary | ICD-10-CM | POA: Insufficient documentation

## 2021-02-07 DIAGNOSIS — Z7951 Long term (current) use of inhaled steroids: Secondary | ICD-10-CM | POA: Diagnosis not present

## 2021-02-07 DIAGNOSIS — E785 Hyperlipidemia, unspecified: Secondary | ICD-10-CM | POA: Diagnosis not present

## 2021-02-07 DIAGNOSIS — R7303 Prediabetes: Secondary | ICD-10-CM | POA: Insufficient documentation

## 2021-02-07 DIAGNOSIS — Z79899 Other long term (current) drug therapy: Secondary | ICD-10-CM | POA: Insufficient documentation

## 2021-02-07 DIAGNOSIS — J449 Chronic obstructive pulmonary disease, unspecified: Secondary | ICD-10-CM | POA: Insufficient documentation

## 2021-02-07 DIAGNOSIS — Z7982 Long term (current) use of aspirin: Secondary | ICD-10-CM | POA: Diagnosis not present

## 2021-02-07 DIAGNOSIS — Z888 Allergy status to other drugs, medicaments and biological substances status: Secondary | ICD-10-CM | POA: Insufficient documentation

## 2021-02-07 DIAGNOSIS — Z1211 Encounter for screening for malignant neoplasm of colon: Secondary | ICD-10-CM | POA: Diagnosis not present

## 2021-02-07 HISTORY — PX: COLONOSCOPY WITH PROPOFOL: SHX5780

## 2021-02-07 SURGERY — COLONOSCOPY WITH PROPOFOL
Anesthesia: General

## 2021-02-07 MED ORDER — SODIUM CHLORIDE 0.9 % IV SOLN: INTRAVENOUS | Status: DC

## 2021-02-07 MED ORDER — PROPOFOL 500 MG/50ML IV EMUL
INTRAVENOUS | Status: DC | PRN
  Administered 2021-02-07: 150 ug/kg/min via INTRAVENOUS

## 2021-02-07 MED ORDER — PROPOFOL 500 MG/50ML IV EMUL
INTRAVENOUS | Status: AC
  Filled 2021-02-07: qty 50

## 2021-02-07 MED ORDER — PROPOFOL 10 MG/ML IV BOLUS
INTRAVENOUS | Status: DC | PRN
Start: 2021-02-07 — End: 2021-02-07
  Administered 2021-02-07: 50 mg via INTRAVENOUS
  Administered 2021-02-07: 30 mg via INTRAVENOUS

## 2021-02-07 MED ORDER — LIDOCAINE HCL (CARDIAC) PF 100 MG/5ML IV SOSY
PREFILLED_SYRINGE | INTRAVENOUS | Status: DC | PRN
  Administered 2021-02-07: 50 mg via INTRAVENOUS

## 2021-02-07 NOTE — Anesthesia Postprocedure Evaluation (Signed)
Anesthesia Post Note  Patient: Nathaniel [name]  Procedure(s) Performed: COLONOSCOPY WITH PROPOFOL  Patient location during evaluation: Phase II Anesthesia Type: General Level of consciousness: awake and alert, awake and oriented Pain management: pain level controlled Vital Signs Assessment: post-procedure vital signs reviewed and stable Respiratory status: spontaneous breathing, nonlabored ventilation and respiratory function stable Cardiovascular status: blood pressure returned to baseline and stable Postop Assessment: no apparent nausea or vomiting Anesthetic complications: no   No notable events documented.   Last Vitals:  Vitals:   02/07/21 0727 02/07/21 0930  BP: (!) 146/98 (!) 156/81  Pulse: 81   Resp: 18   Temp: (!) 36.2 C (!) 35.9 C  SpO2: 99%     Last Pain:  Vitals:   02/07/21 0930  TempSrc: Temporal  PainSc: 0-No pain                 Phill Mutter

## 2021-02-07 NOTE — Transfer of Care (Signed)
Immediate Anesthesia Transfer of Care Note  Patient: Nathaniel [name]  Procedure(s) Performed: COLONOSCOPY WITH PROPOFOL  Patient Location: PACU and Endoscopy Unit  Anesthesia Type:General  Level of Consciousness: awake, drowsy and patient cooperative  Airway & Oxygen Therapy: Patient Spontanous Breathing  Post-op Assessment: Report given to RN and Post -op Vital signs reviewed and stable  Post vital signs: Reviewed and stable  Last Vitals:  Vitals Value Taken Time  BP 131/77 02/07/21 0902  Temp    Pulse 79 02/07/21 0902  Resp 17 02/07/21 0902  SpO2 97 % 02/07/21 0902  Vitals shown include unvalidated device data.  Last Pain:  Vitals:   02/07/21 0727  TempSrc: Temporal  PainSc: 0-No pain         Complications: No notable events documented.

## 2021-02-07 NOTE — Op Note (Signed)
[facility] Patient Name: Nathaniel [name] Procedure Date: 02/07/2021 8:31 AM MRN: [id number] Account #: [id number] Date of Birth: [birth year] Admit Type: Outpatient Age: 69 Room: [facility] ENDO ROOM 2 Gender: Male Note Status: Finalized Procedure:             Colonoscopy Indications:           Surveillance: Personal history of adenomatous polyps                         on last colonoscopy > 3 years ago Providers:             Jonathon Bellows MD, MD Referring MD:          Pleas Koch (Referring MD) Medicines:             Monitored Anesthesia Care Complications:         No immediate complications. Procedure:             Pre-Anesthesia Assessment:                        - Prior to the procedure, a History and Physical was                         performed, and patient medications, allergies and                         sensitivities were reviewed. The patient's tolerance                         of previous anesthesia was reviewed.                        - The risks and benefits of the procedure and the                         sedation options and risks were discussed with the                         patient. All questions were answered and informed                         consent was obtained.                        - ASA Grade Assessment: II - A patient with mild                         systemic disease.                        After obtaining informed consent, the colonoscope was                         passed under direct vision. Throughout the procedure,                         the patient's blood pressure, pulse, and oxygen                         saturations were monitored continuously.  The                         Colonoscope was introduced through the anus and                         advanced to the the cecum, identified by the                         appendiceal orifice. The colonoscopy was performed                         with ease. The patient  tolerated the procedure well.                         The quality of the bowel preparation was excellent. Findings:      The perianal and digital rectal examinations were normal.      Three sessile polyps were found in the sigmoid colon. The polyps were 4       to 5 mm in size. These polyps were removed with a cold snare. Resection       and retrieval were complete.      A 8 mm polyp was found in the transverse colon. The polyp was sessile.       The polyp was removed with a cold snare. Resection and retrieval were       complete.      Non-bleeding internal hemorrhoids were found during retroflexion. The       hemorrhoids were severe, large and Grade II (internal hemorrhoids that       prolapse but reduce spontaneously).      The exam was otherwise without abnormality on direct and retroflexion       views. Impression:            - Three 4 to 5 mm polyps in the sigmoid colon, removed                         with a cold snare. Resected and retrieved.                        - One 8 mm polyp in the transverse colon, removed with                         a cold snare. Resected and retrieved.                        - Non-bleeding internal hemorrhoids.                        - The examination was otherwise normal on direct and                         retroflexion views. Recommendation:        - Discharge patient to home (with escort).                        - Resume previous diet.                        - Continue present medications.                        -  Await pathology results.                        - Repeat colonoscopy for surveillance based on                         pathology results. Procedure Code(s):     --- Professional ---                        [phone], Colonoscopy, flexible; with removal of                         tumor(s), polyp(s), or other lesion(s) by snare                         technique Diagnosis Code(s):     --- Professional ---                        K63.5, Polyp of  colon                        Z86.010, Personal history of colonic polyps                        K64.1, Second degree hemorrhoids CPT copyright 2019 American Medical Association. All rights reserved. The codes documented in this report are preliminary and upon coder review may  be revised to meet current compliance requirements. Jonathon Bellows, MD Jonathon Bellows MD, MD 02/07/2021 9:01:27 AM This report has been signed electronically. Number of Addenda: 0 Note Initiated On: 02/07/2021 8:31 AM Scope Withdrawal Time: 0 hours 12 minutes 26 seconds  Total Procedure Duration: 0 hours 14 minutes 25 seconds  Estimated Blood Loss:  Estimated blood loss: none.      [facility]

## 2021-02-07 NOTE — H&P (Signed)
Jonathon Bellows, MD [address] [place], [place], [place], [address] [address], [place], [place], [place], [address] Phone: [phone]  Fax: [phone]  Primary Care Physician:  Pleas Koch, NP   Pre-Procedure History & Physical: HPI:  Nathaniel [name] is a 70 y.o. male is here for an colonoscopy.   Past Medical History:  Diagnosis Date   Cataract    Chicken pox    GERD (gastroesophageal reflux disease)    Hyperlipidemia    PAD (peripheral artery disease) (HCC)    Peripheral vascular disease (HCC)    Pre-diabetes    Seizures ([place])    None since age 61    Past Surgical History:  Procedure Laterality Date   BACK SURGERY     LAMINECTOMY  1987   LOWER EXTREMITY ANGIOGRAPHY Left 05/02/2017   Procedure: Lower Extremity Angiography;  Surgeon: Algernon Huxley, MD;  Location: [facility];  Service: Cardiovascular;  Laterality: Left;   LOWER EXTREMITY ANGIOGRAPHY Left 11/16/2019   Procedure: LOWER EXTREMITY ANGIOGRAPHY;  Surgeon: Algernon Huxley, MD;  Location: [facility];  Service: Cardiovascular;  Laterality: Left;   LOWER EXTREMITY ANGIOGRAPHY Left 04/11/2020   Procedure: LOWER EXTREMITY ANGIOGRAPHY;  Surgeon: Algernon Huxley, MD;  Location: [facility];  Service: Cardiovascular;  Laterality: Left;   LOWER EXTREMITY ANGIOGRAPHY Left 04/12/2020   Procedure: Lower Extremity Angiography;  Surgeon: Algernon Huxley, MD;  Location: [facility];  Service: Cardiovascular;  Laterality: Left;   TONSILLECTOMY      Prior to Admission medications   Medication Sig Start Date End Date Taking? Authorizing Provider  budesonide-formoterol (SYMBICORT) 160-4.5 MCG/ACT inhaler Inhale 2 puffs into the lungs 2 (two) times daily. 04/30/20  Yes Pleas Koch, NP  esomeprazole (NEXIUM) 20 MG capsule Take 20 mg by mouth daily as needed (heartburn).    Yes [provider]  Multiple Vitamins-Minerals (MULTIVITAMIN WITH MINERALS) tablet Take 1 tablet by  mouth daily.   Yes [provider]  polyethylene glycol-electrolytes (NULYTELY) 420 g solution Prepare according to package instructions. Starting at 5:00 PM: Drink one 8 oz glass of mixture every 15 minutes until you finish half of the jug. Five hours prior to procedure, drink 8 oz glass of mixture every 15 minutes until it is all gone. Make sure you do not drink anything 4 hours prior to your procedure. 01/12/21  Yes Jonathon Bellows, MD  albuterol (VENTOLIN HFA) 108 (90 Base) MCG/ACT inhaler Inhale 2 puffs into the lungs every 6 (six) hours as needed for shortness of breath. 03/09/20   Pleas Koch, NP  apixaban (ELIQUIS) 5 MG TABS tablet Take 1 tablet (5 mg total) by mouth 2 (two) times daily. 04/13/20   Stegmayer, Joelene Millin A, PA-C  ASPIRIN LOW DOSE 81 MG EC tablet TAKE 1 TABLET BY MOUTH EVERY DAY 12/26/20   Algernon Huxley, MD  diphenhydrAMINE (BENADRYL) 25 MG tablet Take 25 mg by mouth daily as needed for itching.     [provider]  HYDROcodone-acetaminophen (NORCO/VICODIN) 5-325 MG tablet Take 1 tablet by mouth every 6 (six) hours as needed for moderate pain. 04/13/20   Stegmayer, Joelene Millin A, PA-C  ibuprofen (ADVIL) 200 MG tablet Take 200-400 mg by mouth every 6 (six) hours as needed for fever or mild pain.     [provider]    Allergies as of 01/13/2021 - Review Complete 08/02/2020  Allergen Reaction Noted   Fentanyl Other (See Comments) 04/08/2020  Prozac [fluoxetine hcl] Other (See Comments) 11/16/2014   Statins Other (See Comments) 11/14/2017    Family History  Problem Relation Age of Onset   Heart disease Mother        Arrthymia    Stroke Father        Deceased   Dementia Father    Heart disease Brother        Myocardial infarction   Colon cancer Neg Hx     Social History   Socioeconomic History   Marital status: Married    Spouse name: Not on file   Number of children: Not on file   Years of education: Not on file   Highest education level: Not  on file  Occupational History   Not on file  Tobacco Use   Smoking status: Former    Packs/day: 1.00    Years: 50.00    Pack years: 50.00    Types: Cigarettes    Quit date: 11/21/2019    Years since quitting: 1.2   Smokeless tobacco: Never  Vaping Use   Vaping Use: Never used  Substance and Sexual Activity   Alcohol use: Not Currently    Alcohol/week: 0.0 standard drinks   Drug use: No   Sexual activity: Not on file  Other Topics Concern   Not on file  Social History Narrative   Retired.   Stays busy with household work, Surveyor, mining.   Married for 3 years.   Grown children.   Enjoys reading.    Social Determinants of Health   Financial Resource Strain: Low Risk    Difficulty of Paying Living Expenses: Not hard at all  Food Insecurity: No Food Insecurity   Worried About Charity fundraiser in the Last Year: Never true   [place] in the Last Year: Never true  Transportation Needs: No Transportation Needs   Lack of Transportation (Medical): No   Lack of Transportation (Non-Medical): No  Physical Activity: Inactive   Days of Exercise per Week: 0 days   Minutes of Exercise per Session: 0 min  Stress: No Stress Concern Present   Feeling of Stress : Not at all  Social Connections: Not on file  Intimate Partner Violence: Not At Risk   Fear of Current or Ex-Partner: No   Emotionally Abused: No   Physically Abused: No   Sexually Abused: No    Review of Systems: See HPI, otherwise negative ROS  Physical Exam: BP (!) 146/98   Pulse 81   Temp (!) 97.2 F (36.2 C) (Temporal)   Resp 18   Wt 98.1 kg   SpO2 99%   BMI 26.33 kg/m  General:   Alert,  pleasant and cooperative in NAD Head:  Normocephalic and atraumatic. Neck:  Supple; no masses or thyromegaly. Lungs:  Clear throughout to auscultation, normal respiratory effort.    Heart:  +S1, +S2, Regular rate and rhythm, No edema. Abdomen:  Soft, nontender and nondistended. Normal bowel sounds, without  guarding, and without rebound.   Neurologic:  Alert and  oriented x4;  grossly normal neurologically.  Impression/Plan: Nathaniel [name] is here for an colonoscopy to be performed for surveillance due to prior history of colon polyps   Risks, benefits, limitations, and alternatives regarding  colonoscopy have been reviewed with the patient.  Questions have been answered.  All parties agreeable.   Jonathon Bellows, MD  02/07/2021, 8:30 AM

## 2021-02-07 NOTE — Anesthesia Procedure Notes (Signed)
Procedure Name: MAC Date/Time: 02/07/2021 8:43 AM Performed by: Jerrye Noble, CRNA Pre-anesthesia Checklist: Patient identified, Emergency Drugs available, Suction available and Patient being monitored Patient Re-evaluated:Patient Re-evaluated prior to induction

## 2021-02-07 NOTE — Anesthesia Preprocedure Evaluation (Signed)
Anesthesia Evaluation  Patient identified by MRN, date of birth, ID band Patient awake    Reviewed: Allergy & Precautions, NPO status , Patient's Chart, lab work & pertinent test results  Airway Mallampati: II  TM Distance: >3 FB Neck ROM: Full    Dental  (+) Poor Dentition, Missing   Pulmonary COPD,  COPD inhaler, former smoker,    Pulmonary exam normal        Cardiovascular + Peripheral Vascular Disease and + DVT (Off Eliquis 5 days)  Normal cardiovascular exam     Neuro/Psych Seizures - (None since age 85),  negative psych ROS   GI/Hepatic Neg liver ROS, GERD  Medicated,  Endo/Other  negative endocrine ROS  Renal/GU negative Renal ROS  negative genitourinary   Musculoskeletal negative musculoskeletal ROS (+)   Abdominal   Peds negative pediatric ROS (+)  Hematology negative hematology ROS (+)   Anesthesia Other Findings Cataract    Chicken pox    GERD (gastroesophageal reflux disease) Hyperlipidemia    PAD (peripheral artery disease) (HCC) Peripheral vascular disease (HCC)  Pre-diabetes    Seizures (HCC) None since age 61      Reproductive/Obstetrics negative OB ROS                             Anesthesia Physical Anesthesia Plan  ASA: 3  Anesthesia Plan: General   Post-op Pain Management:    Induction: Intravenous  PONV Risk Score and Plan: 2 and Propofol infusion and TIVA  Airway Management Planned: Natural Airway and Nasal Cannula  Additional Equipment:   Intra-op Plan:   Post-operative Plan:   Informed Consent: I have reviewed the patients History and Physical, chart, labs and discussed the procedure including the risks, benefits and alternatives for the proposed anesthesia with the patient or authorized representative who has indicated his/her understanding and acceptance.       Plan Discussed with: CRNA, Anesthesiologist and Surgeon  Anesthesia Plan  Comments:         Anesthesia Quick Evaluation

## 2021-02-08 ENCOUNTER — Encounter: Payer: Self-pay | Admitting: Gastroenterology

## 2021-02-08 LAB — SURGICAL PATHOLOGY

## 2021-02-09 ENCOUNTER — Encounter: Payer: Self-pay | Admitting: Gastroenterology

## 2021-02-13 ENCOUNTER — Other Ambulatory Visit: Payer: Self-pay | Admitting: Primary Care

## 2021-02-13 DIAGNOSIS — J432 Centrilobular emphysema: Secondary | ICD-10-CM

## 2021-02-14 DIAGNOSIS — M9903 Segmental and somatic dysfunction of lumbar region: Secondary | ICD-10-CM | POA: Diagnosis not present

## 2021-02-14 DIAGNOSIS — M5416 Radiculopathy, lumbar region: Secondary | ICD-10-CM | POA: Diagnosis not present

## 2021-02-14 DIAGNOSIS — M5136 Other intervertebral disc degeneration, lumbar region: Secondary | ICD-10-CM | POA: Diagnosis not present

## 2021-02-14 DIAGNOSIS — M9905 Segmental and somatic dysfunction of pelvic region: Secondary | ICD-10-CM | POA: Diagnosis not present

## 2021-03-09 ENCOUNTER — Ambulatory Visit
Admission: RE | Admit: 2021-03-09 | Discharge: 2021-03-09 | Disposition: A | Discharge status: Home / Self Care | Payer: Medicare Other | Source: Ambulatory Visit | Attending: Acute Care | Admitting: Acute Care

## 2021-03-09 ENCOUNTER — Other Ambulatory Visit: Payer: Self-pay

## 2021-03-09 DIAGNOSIS — Z87891 Personal history of nicotine dependence: Secondary | ICD-10-CM | POA: Insufficient documentation

## 2021-03-09 DIAGNOSIS — F17210 Nicotine dependence, cigarettes, uncomplicated: Secondary | ICD-10-CM | POA: Diagnosis not present

## 2021-03-13 ENCOUNTER — Other Ambulatory Visit: Payer: Self-pay | Admitting: Primary Care

## 2021-03-13 DIAGNOSIS — J432 Centrilobular emphysema: Secondary | ICD-10-CM

## 2021-03-14 DIAGNOSIS — M9905 Segmental and somatic dysfunction of pelvic region: Secondary | ICD-10-CM | POA: Diagnosis not present

## 2021-03-14 DIAGNOSIS — M5136 Other intervertebral disc degeneration, lumbar region: Secondary | ICD-10-CM | POA: Diagnosis not present

## 2021-03-14 DIAGNOSIS — M9903 Segmental and somatic dysfunction of lumbar region: Secondary | ICD-10-CM | POA: Diagnosis not present

## 2021-03-14 DIAGNOSIS — M5416 Radiculopathy, lumbar region: Secondary | ICD-10-CM | POA: Diagnosis not present

## 2021-03-23 NOTE — Progress Notes (Signed)
Please call patient and let them  know their  low dose Ct was read as a . Lung RADS 2: nodules that are benign in appearance and behavior with a very low likelihood of becoming a clinically active cancer due to size or lack of growth. Recommendation per radiology is for a repeat LDCT in 12 months. .Please let them  know we will order and schedule their  annual screening scan for 02/2022. Please let them  know there was notation of CAD on their  scan.  Please remind the patient  that this is a non-gated exam therefore degree or severity of disease  cannot be determined. Please have them  follow up with their PCP regarding potential risk factor modification, dietary therapy or pharmacologic therapy if clinically indicated. Pt.  is  not currently on statin therapy. Please place order for annual  screening scan for  02/2022 and fax results to PCP. Thanks so much.  + CAD, No statin, followed by cardiology

## 2021-03-24 ENCOUNTER — Encounter: Payer: Self-pay | Admitting: *Deleted

## 2021-03-24 DIAGNOSIS — Z87891 Personal history of nicotine dependence: Secondary | ICD-10-CM

## 2021-03-24 DIAGNOSIS — F17210 Nicotine dependence, cigarettes, uncomplicated: Secondary | ICD-10-CM

## 2021-03-31 ENCOUNTER — Telehealth: Payer: Self-pay | Admitting: Cardiovascular Disease

## 2021-03-31 NOTE — Telephone Encounter (Signed)
Was able to reach out to Mr. Rundell via phone, he reports increased SOB over the past 2 months, followed by fatigue that has increasingly gotten worse over the past few day, pt reports "I just do not feel like myself". Pt also experiencing headaches and tingling in his hands intermittently. Had to cancel going to the balloon festival today with his wife d/t felling very fatigue and SOB.  Pt able to carry on a conversation w/o difficulty, not having to stop to catch breath, very comical and in good spirits but voices concern that wife is concern for his health as he is usually able to do his normal daily activities and hobbies, but lately has not been able to interact d/t not feeling like himself.   BP 162/95 & 156/100 Has a 30 day report of BP & HR that he will bring to upcoming appt with Dr. Rockey Situ for review, pt is currently not on BP meds. Advised to avoid salt intake to help reduce BP and drink water.   Reminded pt of his inhalers as he has COPD, reminded pt to rest when feeling fatigue and SOB and if for any reason if his symptoms gets worse or starts to develop new symptoms such as SP, dizziness, or weakness then seek the ED for medical treatment, not to wait until appt with Dr. Rockey Situ. Mr. Shillington verbalized understanding and reports his wife is a retired Marine scientist, so she will be able to alert him when it is time to go to the ED.   Dr. Rockey Situ mention in July at last [place] for possible ECHO, advised pt that this is something that can be address given his increased SOB and should be able to schedule for an future appt at his visit next week, along with getting BP meds on board d/t HTN. Again, pt verbalized understanding.  Mr. Pullian very thankful for the return call, very pleasant to interact with. Otherwise all questions or concerns were address and no additional concerns at this time. Agreeable to plan, will call back for anything further.    Scheduled 9-13 at [place]

## 2021-03-31 NOTE — Telephone Encounter (Signed)
Pt c/o Shortness Of Breath: STAT if SOB developed within the last 24 hours or pt is noticeably SOB on the phone  1. Are you currently SOB (can you hear that pt is SOB on the phone)? No   2. How long have you been experiencing SOB? 2 months   3. Are you SOB when sitting or when up moving around? Exertion takes about 10 minutes of rest to recover   4. Are you currently experiencing any other symptoms? HTN bp 162/95 156/100 tingling in hand headaches  fluttering fatigue    Scheduled 9-13 at [place]

## 2021-04-03 NOTE — Progress Notes (Signed)
Cardiology Office Note  Date:  04/04/2021   ID:  Nathaniel [name], DOB April 16, 1951, MRN [id number]  PCP:  Pleas Koch, NP   Chief Complaint  Patient presents with   HTN & Shortness of breath.     Patient c/o shortness of breath with little to no exertion, mild headache, fatigue, tingling in hands, legs burning and fluttering in chest and throat. Medications reviewed by the patient verbally.     HPI:  Nathaniel [name] is a 70 year old gentleman with past medical history of PAD, carotid stenosis, subclavian stenosis With Dr. 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  He has had multiple interventions on that left leg as well as the right external iliac artery. Smoker, COPD Hyperlipidemia COVID April 2022 (and 2019) Hypertension DVT on eliquis, recurrent Who f/u for  shortness of breath, COVID 11/2020, hyperlipidemia  Chronic fatigue, poor sleep Can't walk very far Concerned about cardiac etiology, chest discomfort tightness  Reports long history of leg swelling 1+ edema b/l on exam Etiology unclear, seem to appear after prior vascular surgeries in 2021  Long history of claudication symptoms Chronic shortness of breath, uses inhalers Long history of smoking, reports chronic shortness of breath on exertion  Long history of hyperlipidemia     02/2020: chest CT, Minimal aortic athero, no significant coronary calcification noted  noninvasive studies on 03/21/2020 showed 1 to 39% stenosis of the right carotid normal in the left carotid.  However the right subclavian was noted to be stenotic with turbulent flow.  noninvasive study 6/22 showed only 39% internal carotid artery stenosis bilaterally.  The left subclavian artery is stenotic although the velocities are consistent with previous studies.  The right clavian artery had normal flow hemodynamics.      EKG personally reviewed by myself on todays visit Normal sinus  rhythm rate 70 bpm no significant ST-T wave changes  PMH:   has a past medical history of Cataract, Chicken pox, GERD (gastroesophageal reflux disease), Hyperlipidemia, PAD (peripheral artery disease) ([place]), Peripheral vascular disease ([place]), Pre-diabetes, and Seizures ([place]).  PSH:    Past Surgical History:  Procedure Laterality Date   BACK SURGERY     COLONOSCOPY WITH PROPOFOL N/A 02/07/2021   Procedure: COLONOSCOPY WITH PROPOFOL;  Surgeon: Jonathon Bellows, MD;  [facility] ENDOSCOPY;  Service: Gastroenterology;  Laterality: N/A;   LAMINECTOMY  1987   LOWER EXTREMITY ANGIOGRAPHY Left 05/02/2017   Procedure: Lower Extremity Angiography;  Surgeon: Algernon Huxley, MD;  Location: [place] CV LAB;  Service: Cardiovascular;  Laterality: Left;   LOWER EXTREMITY ANGIOGRAPHY Left 11/16/2019   Procedure: LOWER EXTREMITY ANGIOGRAPHY;  Surgeon: Algernon Huxley, MD;  Location: [place] [facility];  Service: Cardiovascular;  Laterality: Left;   LOWER EXTREMITY ANGIOGRAPHY Left 04/11/2020   Procedure: LOWER EXTREMITY ANGIOGRAPHY;  Surgeon: Algernon Huxley, MD;  Location: [place] [facility];  Service: Cardiovascular;  Laterality: Left;   LOWER EXTREMITY ANGIOGRAPHY Left 04/12/2020   Procedure: Lower Extremity Angiography;  Surgeon: Algernon Huxley, MD;  Location: [place] [facility];  Service: Cardiovascular;  Laterality: Left;   TONSILLECTOMY      Current Outpatient Medications  Medication Sig Dispense Refill   albuterol (VENTOLIN HFA) 108 (90 Base) MCG/ACT inhaler Inhale 2 puffs into the lungs every 6 (six) hours as needed for shortness of breath. 8 g 0   apixaban (ELIQUIS) 5 MG TABS tablet Take 1 tablet (5 mg total) by mouth 2 (two) times daily. 180 tablet 3  ASPIRIN LOW DOSE 81 MG EC tablet TAKE 1 TABLET BY MOUTH EVERY DAY 90 tablet 3   diphenhydrAMINE (BENADRYL) 25 MG tablet Take 25 mg by mouth daily as needed for itching.      esomeprazole (NEXIUM) 20 MG capsule Take 20 mg by mouth daily as needed  (heartburn).      ibuprofen (ADVIL) 200 MG tablet Take 200-400 mg by mouth every 6 (six) hours as needed for fever or mild pain.      Multiple Vitamins-Minerals (MULTIVITAMIN WITH MINERALS) tablet Take 1 tablet by mouth daily.     SYMBICORT 160-4.5 MCG/ACT inhaler TAKE 2 PUFFS BY MOUTH TWICE A DAY 10.2 each 0   HYDROcodone-acetaminophen (NORCO/VICODIN) 5-325 MG tablet Take 1 tablet by mouth every 6 (six) hours as needed for moderate pain. (Patient not taking: Reported on 04/04/2021) 10 tablet 0   polyethylene glycol-electrolytes (NULYTELY) 420 g solution Prepare according to package instructions. Starting at 5:00 PM: Drink one 8 oz glass of mixture every 15 minutes until you finish half of the jug. Five hours prior to procedure, drink 8 oz glass of mixture every 15 minutes until it is all gone. Make sure you do not drink anything 4 hours prior to your procedure. (Patient not taking: Reported on 04/04/2021) 4000 mL 0   No current facility-administered medications for this visit.     Allergies:   Fentanyl, Prozac [fluoxetine hcl], and Statins   Social History:  The patient  reports that he quit smoking about 16 months ago. His smoking use included cigarettes. He has a 50.00 pack-year smoking history. He has never used smokeless tobacco. He reports that he does not currently use alcohol. He reports that he does not use drugs.   Family History:   family history includes Dementia in his father; Heart disease in his brother and mother; Stroke in his father.    Review of Systems: Review of Systems  Constitutional: Negative.   HENT: Negative.    Respiratory: Negative.    Cardiovascular: Negative.   Gastrointestinal: Negative.   Musculoskeletal: Negative.   Neurological: Negative.   Psychiatric/Behavioral: Negative.    All other systems reviewed and are negative.   PHYSICAL EXAM: VS:  BP (!) 160/80 (BP Location: Left Arm, Patient Position: Sitting, Cuff Size: Normal)   Pulse 86   Ht '6\' 4"'$   (1.93 m)   Wt 218 lb 8 oz (99.1 kg)   SpO2 98%   BMI 26.60 kg/m  , BMI Body mass index is 26.6 kg/m. Constitutional:  oriented to person, place, and time. No distress.  HENT:  Head: Grossly normal Eyes:  no discharge. No scleral icterus.  Neck: No JVD, no carotid bruits  Cardiovascular: Regular rate and rhythm, no murmurs appreciated Pulmonary/Chest: Clear to auscultation bilaterally, no wheezes or rails Abdominal: Soft.  no distension.  no tenderness.  Musculoskeletal: Normal range of motion Neurological:  normal muscle tone. Coordination normal. No atrophy Skin: Skin warm and dry Psychiatric: normal affect, pleasant  Recent Labs: 04/13/2020: BUN 10; Creatinine, Ser 1.08; Hemoglobin 12.7; Magnesium 2.1; Platelets 146; Potassium 4.5; Sodium 139    Lipid Panel Lab Results  Component Value Date   CHOL 237 (H) 03/07/2020   HDL 41.30 03/07/2020   LDLCALC 158 (H) 02/10/2018   TRIG 245.0 (H) 03/07/2020      Wt Readings from Last 3 Encounters:  04/04/21 218 lb 8 oz (99.1 kg)  03/09/21 205 lb (93 kg)  02/07/21 216 lb 4.3 oz (98.1 kg)     ASSESSMENT  AND PLAN:  Problem List Items Addressed This Visit       Cardiology Problems   Atherosclerosis of native arteries of extremity with intermittent claudication ([place]) - Primary   Relevant Orders   EKG 12-Lead   Hyperlipidemia   Stenosis of carotid artery     Other   Tobacco abuse   Centrilobular emphysema (HCC)   Chest discomfort   Other Visit Diagnoses     PAD (peripheral artery disease) ([place])       Relevant Orders   EKG 12-Lead     Peripheral arterial disease Followed by Dr. Lucky Cowboy, unable to walk very far without having to stop secondary to claudication Prior smoking history A1c at goal 5.9 reports statin myalgias, has tried 3 Side effects on Zetia, does not want to retry Declining PCSK9 inhibitor  COPD Uses inhaler as needed, long smoking history  Essential hypertension Recommend he start losartan 50 mg  daily, BMP in 1 month  Shortness of breath Secondary to COPD, deconditioning, unable to exclude angina Stress test ordered, echocardiogram ordered  Hyperlipidemia Previously declining statins Declining Zetia, PCSK9 inhibitor   Total encounter time more than 35 minutes  Greater than 50% was spent in counseling and coordination of care with the patient    Signed, Esmond Plants, M.D., Ph.D. [place], [place]

## 2021-04-04 ENCOUNTER — Encounter: Payer: Self-pay | Admitting: Cardiovascular Disease

## 2021-04-04 ENCOUNTER — Ambulatory Visit (INDEPENDENT_AMBULATORY_CARE_PROVIDER_SITE_OTHER): Payer: Medicare Other | Admitting: Cardiovascular Disease

## 2021-04-04 ENCOUNTER — Other Ambulatory Visit: Payer: Self-pay

## 2021-04-04 VITALS — BP 160/80 | HR 86 | Ht 76.0 in | Wt 218.5 lb

## 2021-04-04 DIAGNOSIS — T466X5A Adverse effect of antihyperlipidemic and antiarteriosclerotic drugs, initial encounter: Secondary | ICD-10-CM | POA: Diagnosis not present

## 2021-04-04 DIAGNOSIS — I70212 Atherosclerosis of native arteries of extremities with intermittent claudication, left leg: Secondary | ICD-10-CM | POA: Diagnosis not present

## 2021-04-04 DIAGNOSIS — E785 Hyperlipidemia, unspecified: Secondary | ICD-10-CM | POA: Diagnosis not present

## 2021-04-04 DIAGNOSIS — M791 Myalgia, unspecified site: Secondary | ICD-10-CM | POA: Diagnosis not present

## 2021-04-04 DIAGNOSIS — Z79899 Other long term (current) drug therapy: Secondary | ICD-10-CM | POA: Diagnosis not present

## 2021-04-04 DIAGNOSIS — R0789 Other chest pain: Secondary | ICD-10-CM | POA: Diagnosis not present

## 2021-04-04 DIAGNOSIS — Z720 Tobacco use: Secondary | ICD-10-CM

## 2021-04-04 DIAGNOSIS — I6529 Occlusion and stenosis of unspecified carotid artery: Secondary | ICD-10-CM

## 2021-04-04 DIAGNOSIS — I201 Angina pectoris with documented spasm: Secondary | ICD-10-CM

## 2021-04-04 DIAGNOSIS — J432 Centrilobular emphysema: Secondary | ICD-10-CM

## 2021-04-04 DIAGNOSIS — I739 Peripheral vascular disease, unspecified: Secondary | ICD-10-CM

## 2021-04-04 DIAGNOSIS — R0602 Shortness of breath: Secondary | ICD-10-CM | POA: Diagnosis not present

## 2021-04-04 MED ORDER — LOSARTAN POTASSIUM 50 MG PO TABS: 50.0000 mg | ORAL_TABLET | Freq: Every day | ORAL | 3 refills | Status: DC

## 2021-04-04 NOTE — Telephone Encounter (Signed)
Please advise 

## 2021-04-04 NOTE — Patient Instructions (Addendum)
Medication Instructions:  Losartan 50 mg daily  If you need a refill on your cardiac medications before your next appointment, please call your pharmacy.   Lab work: BMP in one month 10/13 at 10:30 am At Dr. Donivan Scull office  Testing/Procedures: ECHO Lexiscan (stress test)  Follow-Up: At [facility], you and your health needs are our priority.  As part of our continuing mission to provide you with exceptional heart care, we have created designated Provider Care Teams.  These Care Teams include your primary Cardiologist (physician) and Advanced Practice Providers (APPs -  Physician Assistants and Nurse Practitioners) who all work together to provide you with the care you need, when you need it.  You will need a follow up appointment in 6 months  Providers on your designated Care Team:   Murray Hodgkins, NP Christell Faith, PA-C Marrianne Mood, PA-C Cadence Lytle, [place]  COVID-19 Vaccine Information can be found at: [email or web address].uk For questions related to vaccine distribution or appointments, please email [email or web address] or call [phone].   Your physician has requested that you have an echocardiogram. Echocardiography is a painless test that uses sound waves to create images of your heart. It provides your doctor with information about the size and shape of your heart and how well your heart's chambers and valves are working. This procedure takes approximately one hour. There are no restrictions for this procedure.  There is a possibility that an IV may need to be started during your test to inject an image enhancing agent. This is done to obtain more optimal pictures of your heart. Therefore we ask that you do at least drink some water prior to coming in to hydrate your veins.   [facility] MYOVIEW Veterinary surgeon)  Your caregiver has ordered a Stress Test with nuclear imaging. The purpose of this test is to evaluate the  blood supply to your heart muscle. This procedure is referred to as a "Non-Invasive Stress Test." This is because other than having an IV started in your vein, nothing is inserted or "invades" your body. Cardiac stress tests are done to find areas of poor blood flow to the heart by determining the extent of coronary artery disease (CAD). Some patients exercise on a treadmill, which naturally increases the blood flow to your heart, while others who are  unable to walk on a treadmill due to physical limitations have a pharmacologic/chemical stress agent called Lexiscan . This medicine will mimic walking on a treadmill by temporarily increasing your coronary blood flow.   Please note: these test may take anywhere between 2-4 hours to complete  PLEASE REPORT TO [place] AT THE FIRST DESK WILL DIRECT YOU WHERE TO GO  Instructions regarding medication:   ____ : Hold diabetes medication morning of procedure None  ____:  Hold betablocker(s) night before procedure and morning of procedure None  ____:  Hold other medications as follows: None  How to prepare for your Myoview test:  Do not eat or drink after midnight No caffeine for 24 hours prior to test No smoking 24 hours prior to test. ALL your medication may be taken with a few sips of water.   (Except for the meds mention above) Please wear a short sleeve shirt. Comfortable pants are appropriate. No perfume, cologne or lotion.  PLEASE NOTIFY THE OFFICE AT LEAST 9 HOURS IN ADVANCE IF YOU ARE UNABLE TO KEEP YOUR APPOINTMENT.  [phone] AND  PLEASE NOTIFY NUCLEAR MEDICINE AT [facility] AT LEAST 24 HOURS IN ADVANCE IF  YOU ARE UNABLE TO KEEP YOUR APPOINTMENT. [phone]

## 2021-04-05 NOTE — Telephone Encounter (Signed)
Please set up for virtual visit.

## 2021-04-07 ENCOUNTER — Other Ambulatory Visit (INDEPENDENT_AMBULATORY_CARE_PROVIDER_SITE_OTHER): Payer: Self-pay | Admitting: Nurse Practitioner

## 2021-04-07 DIAGNOSIS — I771 Stricture of artery: Secondary | ICD-10-CM

## 2021-04-10 ENCOUNTER — Other Ambulatory Visit: Payer: Self-pay

## 2021-04-10 ENCOUNTER — Ambulatory Visit (INDEPENDENT_AMBULATORY_CARE_PROVIDER_SITE_OTHER): Payer: Medicare Other

## 2021-04-10 ENCOUNTER — Ambulatory Visit (INDEPENDENT_AMBULATORY_CARE_PROVIDER_SITE_OTHER): Payer: Medicare Other | Admitting: Nurse Practitioner

## 2021-04-10 VITALS — BP 171/92 | HR 88 | Ht >= 80 in | Wt 218.0 lb

## 2021-04-10 DIAGNOSIS — E785 Hyperlipidemia, unspecified: Secondary | ICD-10-CM | POA: Diagnosis not present

## 2021-04-10 DIAGNOSIS — I70212 Atherosclerosis of native arteries of extremities with intermittent claudication, left leg: Secondary | ICD-10-CM

## 2021-04-10 DIAGNOSIS — I771 Stricture of artery: Secondary | ICD-10-CM | POA: Diagnosis not present

## 2021-04-11 ENCOUNTER — Encounter
Admission: RE | Admit: 2021-04-11 | Discharge: 2021-04-11 | Disposition: A | Discharge status: Home / Self Care | Payer: Medicare Other | Source: Ambulatory Visit | Attending: Cardiovascular Disease | Admitting: Cardiovascular Disease

## 2021-04-11 DIAGNOSIS — I201 Angina pectoris with documented spasm: Secondary | ICD-10-CM | POA: Diagnosis not present

## 2021-04-11 DIAGNOSIS — R0789 Other chest pain: Secondary | ICD-10-CM | POA: Insufficient documentation

## 2021-04-11 DIAGNOSIS — R0602 Shortness of breath: Secondary | ICD-10-CM | POA: Insufficient documentation

## 2021-04-11 DIAGNOSIS — I70212 Atherosclerosis of native arteries of extremities with intermittent claudication, left leg: Secondary | ICD-10-CM | POA: Insufficient documentation

## 2021-04-11 LAB — NM MYOCAR MULTI W/SPECT W/WALL MOTION / EF
Base ST Depression (mm): 0 mm
Estimated workload: 1
Exercise duration (min): 0 min
Exercise duration (sec): 0 s
LV dias vol: 100 mL (ref 62–150)
LV sys vol: 32 mL
MPHR: 148 {beats}/min
Nuc Stress EF: 68 %
Peak HR: 108 {beats}/min
Percent HR: 72 %
Rest HR: 78 {beats}/min
Rest Nuclear Isotope Dose: 10.7 mCi
SDS: 1
SRS: 5
SSS: 3
ST Depression (mm): 0 mm
Stress Nuclear Isotope Dose: 32.6 mCi
TID: 1.02

## 2021-04-11 MED ORDER — REGADENOSON 0.4 MG/5ML IV SOLN
0.4000 mg | Freq: Once | INTRAVENOUS | Status: AC
  Administered 2021-04-11: 0.4 mg via INTRAVENOUS

## 2021-04-11 MED ORDER — TECHNETIUM TC 99M TETROFOSMIN IV KIT
30.0000 | PACK | Freq: Once | INTRAVENOUS | Status: AC
  Administered 2021-04-11: 32.56 via INTRAVENOUS

## 2021-04-11 MED ORDER — TECHNETIUM TC 99M TETROFOSMIN IV KIT
10.0000 | PACK | Freq: Once | INTRAVENOUS | Status: AC
  Administered 2021-04-11: 10.67 via INTRAVENOUS

## 2021-04-12 DIAGNOSIS — M9905 Segmental and somatic dysfunction of pelvic region: Secondary | ICD-10-CM | POA: Diagnosis not present

## 2021-04-12 DIAGNOSIS — M5136 Other intervertebral disc degeneration, lumbar region: Secondary | ICD-10-CM | POA: Diagnosis not present

## 2021-04-12 DIAGNOSIS — M5416 Radiculopathy, lumbar region: Secondary | ICD-10-CM | POA: Diagnosis not present

## 2021-04-12 DIAGNOSIS — M9903 Segmental and somatic dysfunction of lumbar region: Secondary | ICD-10-CM | POA: Diagnosis not present

## 2021-04-13 ENCOUNTER — Encounter: Payer: Self-pay | Admitting: Primary Care

## 2021-04-13 ENCOUNTER — Other Ambulatory Visit: Payer: Self-pay

## 2021-04-13 ENCOUNTER — Telehealth (INDEPENDENT_AMBULATORY_CARE_PROVIDER_SITE_OTHER): Payer: Medicare Other | Admitting: Primary Care

## 2021-04-13 VITALS — BP 140/89 | HR 77 | Temp 97.1°F | Ht >= 80 in | Wt 218.0 lb

## 2021-04-13 DIAGNOSIS — E785 Hyperlipidemia, unspecified: Secondary | ICD-10-CM | POA: Diagnosis not present

## 2021-04-13 DIAGNOSIS — R400 Somnolence: Secondary | ICD-10-CM

## 2021-04-13 DIAGNOSIS — R7303 Prediabetes: Secondary | ICD-10-CM | POA: Diagnosis not present

## 2021-04-13 DIAGNOSIS — I70212 Atherosclerosis of native arteries of extremities with intermittent claudication, left leg: Secondary | ICD-10-CM

## 2021-04-13 DIAGNOSIS — I6523 Occlusion and stenosis of bilateral carotid arteries: Secondary | ICD-10-CM

## 2021-04-13 DIAGNOSIS — J432 Centrilobular emphysema: Secondary | ICD-10-CM | POA: Diagnosis not present

## 2021-04-13 DIAGNOSIS — Z720 Tobacco use: Secondary | ICD-10-CM | POA: Diagnosis not present

## 2021-04-13 NOTE — Assessment & Plan Note (Signed)
Discouraged consumption of sweet tea, encouraged water. Repeat A1c pending.

## 2021-04-13 NOTE — Assessment & Plan Note (Signed)
Continued. Reviewed lung cancer screening from August 2022.  Repeat in August 2023.

## 2021-04-13 NOTE — Assessment & Plan Note (Signed)
Chronic and continued.  Fortunately recent Myoview stress test was low risk study.  Suspect untreated emphysema to be contributing to dyspnea and fatigue during the day.  He has also developed an altered sleeping pattern, discourage naps during the day, discouraged caffeine after 12 PM, recommended he read rather than playing on the computer before bed.  Discussed to start melatonin 5 to 10 mg for sleep.  He will update. Consider sleep study.

## 2021-04-13 NOTE — Assessment & Plan Note (Signed)
Strongly suspect exertional dyspnea secondary to emphysema.  Discussed the absolute need for compliance to his Symbicort inhaler as prescribed.  Myoview stress test reviewed and fortunately appears to be low risk study.  Awaiting echocardiogram testing.

## 2021-04-13 NOTE — Assessment & Plan Note (Signed)
Recent carotid ultrasound reviewed, continue blood pressure control, lipid control.  Repeat lipid panel pending.  Cannot tolerate statins.

## 2021-04-13 NOTE — Progress Notes (Signed)
Patient ID: Nathaniel [name], male    DOB: May 12, 1951, 70 y.o.   MRN: [id number]  Virtual visit completed through [place], a video enabled telemedicine application. Due to national recommendations of social distancing due to COVID-19, a virtual visit is felt to be most appropriate for this patient at this time. Reviewed limitations, risks, security and privacy concerns of performing a virtual visit and the availability of in person appointments. I also reviewed that there may be a patient responsible charge related to this service. The patient agreed to proceed.   Patient location: home Provider location: [place] at [facility], office Persons participating in this virtual visit: patient, provider   If any vitals were documented, they were collected by patient at home unless specified below.    BP 140/89   Pulse 77   Temp (!) 97.1 F (36.2 C)   Ht 6\' 8"  (2.032 m)   Wt 218 lb (98.9 kg)   BMI 23.95 kg/m    CC: Follow up and to discuss sleep Subjective:   HPI: Nathaniel [name] is a very pleasant 70 y.o. male with a history of COPD, tobacco abuse, hyperlipidemia, left lower extremity ischemia, carotid artery stenosis, prediabetes presenting on 04/13/2021 for follow-up and to discuss sleep disturbance.    1) Sleep Disturbance: Chronic history of sleep disturbance, mostly staying asleep.  He goes to bed each night around 9 PM, he will fall asleep immediately, but will wake up 2 hours later and will stay up anywhere from a few minutes to hours.  Sometimes he is unable to fall back asleep.  This occurs most nights.   Prior to bed each night he plays on the computer. He drinks coffee with caffeine, 1-2 cups daily, stops drinking by 12 pm. He drinks sweet tea throughout the day until about 6 pm.   He wakes up feeling unfocused, tired, naps during the day, doesn't feel comfortable driving.   He was recently prescribed Hydrocodone cough syrup, took about 5 ml and slept through the  night, woke up feeling refreshed.   He's tried taking Melatonin, this works pretty well, has never taken this consistently.  He has never been tested for sleep apnea, he denies snoring, his wife doesn't notice he snores.   2) COPD: Currently managed on Symbicort 117mcg twice daily for which he is inconsistently using.  He often forgets his evening dose, sometimes skips 3 to 4 days altogether.  He continues to notice exertional dyspnea when walking short distances.  He underwent lung cancer screening CT scan in August 2022, benign appearance, no nodules noted.  He underwent Myoview stress test 2 days ago, low risk study, LVEF of 55 to 65%.  He is scheduled for an echocardiogram in October.  3) Essential Hypertension: Recently prescribed losartan 50 mg for consistently elevated blood pressure readings in the 150's-160's/70's.  He is checking his blood pressure regularly at home, readings are in the 140s over 70s now.  Evaluated by cardiology last week who ordered Myoview stress test and echocardiogram for exertional dyspnea.  4) Atherosclerosis/Carotid Artery Stenosis: Following with vascular services, last visit was in June 2022.  He underwent carotid artery ultrasound 2 days ago, right and left carotids with 1 to 39% stenosis.  Today he endorses noncompliance to his Eliquis, often misses the evening dose.  Prescribed Eliquis for recurrent DVT.      Relevant past medical, surgical, family and social history reviewed and updated as indicated. Interim medical history since our last visit reviewed.  Allergies and medications reviewed and updated. Outpatient Medications Prior to Visit  Medication Sig Dispense Refill   albuterol (VENTOLIN HFA) 108 (90 Base) MCG/ACT inhaler Inhale 2 puffs into the lungs every 6 (six) hours as needed for shortness of breath. 8 g 0   apixaban (ELIQUIS) 5 MG TABS tablet Take 1 tablet (5 mg total) by mouth 2 (two) times daily. 180 tablet 3   ASPIRIN LOW DOSE 81 MG EC  tablet TAKE 1 TABLET BY MOUTH EVERY DAY 90 tablet 3   diphenhydrAMINE (BENADRYL) 25 MG tablet Take 25 mg by mouth daily as needed for itching.      esomeprazole (NEXIUM) 20 MG capsule Take 20 mg by mouth daily as needed (heartburn).      ibuprofen (ADVIL) 200 MG tablet Take 200-400 mg by mouth every 6 (six) hours as needed for fever or mild pain.      losartan (COZAAR) 50 MG tablet Take 1 tablet (50 mg total) by mouth daily. 90 tablet 3   Multiple Vitamins-Minerals (MULTIVITAMIN WITH MINERALS) tablet Take 1 tablet by mouth daily.     SYMBICORT 160-4.5 MCG/ACT inhaler TAKE 2 PUFFS BY MOUTH TWICE A DAY 10.2 each 0   HYDROcodone-acetaminophen (NORCO/VICODIN) 5-325 MG tablet Take 1 tablet by mouth every 6 (six) hours as needed for moderate pain. (Patient not taking: Reported on 04/04/2021) 10 tablet 0   polyethylene glycol-electrolytes (NULYTELY) 420 g solution Prepare according to package instructions. Starting at 5:00 PM: Drink one 8 oz glass of mixture every 15 minutes until you finish half of the jug. Five hours prior to procedure, drink 8 oz glass of mixture every 15 minutes until it is all gone. Make sure you do not drink anything 4 hours prior to your procedure. (Patient not taking: Reported on 04/04/2021) 4000 mL 0   No facility-administered medications prior to visit.     Per HPI unless specifically indicated in ROS section below Review of Systems  Constitutional:  Positive for fatigue.  Respiratory:  Positive for shortness of breath.   Cardiovascular:  Negative for chest pain.  Psychiatric/Behavioral:  Positive for sleep disturbance. The patient is nervous/anxious.   Objective:  BP 140/89   Pulse 77   Temp (!) 97.1 F (36.2 C)   Ht 6\' 8"  (2.032 m)   Wt 218 lb (98.9 kg)   BMI 23.95 kg/m   Wt Readings from Last 3 Encounters:  04/13/21 218 lb (98.9 kg)  04/10/21 218 lb (98.9 kg)  04/04/21 218 lb 8 oz (99.1 kg)       Physical exam: Gen: alert, NAD, not ill appearing Pulm: speaks  in complete sentences without increased work of breathing Psych: normal mood, normal thought content      Results for orders placed or performed during the hospital encounter of 04/11/21  NM Myocar Multi W/Spect W/Wall Motion / EF  Result Value Ref Range   Rest HR 78.0 bpm   Rest BP 152/91 mmHg   Exercise duration (min) 0 min   Exercise duration (sec) 0 sec   Estimated workload 1.0    Peak HR 108 bpm   Peak BP 174/99 mmHg   MPHR 148 bpm   Percent HR 72.0 %   Rest Nuclear Isotope Dose 10.7 mCi   Stress Nuclear Isotope Dose 32.6 mCi   SSS 3.0    SRS 5.0    SDS 1.0    TID 1.02    LV sys vol 32.0 mL   LV dias vol 100.0 62 -  150 mL   Nuc Stress EF 68 %   Base ST Depression (mm) 0 mm   ST Depression (mm) 0 mm   Assessment & Plan:   Problem List Items Addressed This Visit   None    No orders of the defined types were placed in this encounter.  No orders of the defined types were placed in this encounter.   I discussed the assessment and treatment plan with the patient. The patient was provided an opportunity to ask questions and all were answered. The patient agreed with the plan and demonstrated an understanding of the instructions. The patient was advised to call back or seek an in-person evaluation if the symptoms worsen or if the condition fails to improve as anticipated.  Follow up plan:  Please use your Symbicort inhaler twice daily as prescribed.  This can help with your breathing.  Start melatonin 5 to 10 mg at bedtime for sleep.  Complete your labs on 04/19/2021 at 9 AM.  Please update me regarding your sleep.  It was a pleasure to see you today!   Pleas Koch, NP

## 2021-04-13 NOTE — Assessment & Plan Note (Signed)
Intolerant to statins.  Repeat lipid panel pending. 

## 2021-04-13 NOTE — Assessment & Plan Note (Signed)
Follow with vascular services, notes from June 2022 reviewed.  Carotid ultrasound reviewed from September 2020.

## 2021-04-17 ENCOUNTER — Encounter (INDEPENDENT_AMBULATORY_CARE_PROVIDER_SITE_OTHER): Payer: Self-pay | Admitting: Nurse Practitioner

## 2021-04-17 ENCOUNTER — Telehealth: Payer: Self-pay

## 2021-04-17 NOTE — Progress Notes (Signed)
Subjective:    Patient ID: Nathaniel [name], male    DOB: July 06, 1951, 70 y.o.   MRN: [id number] Chief Complaint  Patient presents with   Follow-up    3 Mo Carotid     Nathaniel [name] is a 70 year old male that returns today for evaluation of left subclavian artery stenosis.  He denies any recent dizziness or other concerning issues or worsening subclavian stenosis however he continues to have fatigue and and notes that he is not able to go long distances without having fatigue causing him to rest.  He notes that this is not like his normal claudication-like symptoms but just overall fatigue.  This is since his most recent episode of COVID.  The patient notes that he is having upcoming testing done by cardiology tomorrow.  Today noninvasive studies show carotid stent stenosis of 1 to 39% bilaterally in the ICAs.  The left subclavian artery is stenotic with normal flow hemodynamics in the right subclavian artery.  Velocities are consistent with previous studies.    Review of Systems  Constitutional:  Positive for fatigue.  Neurological:  Negative for dizziness.  All other systems reviewed and are negative.     Objective:   Physical Exam Vitals reviewed.  HENT:     Head: Normocephalic.  Neck:     Vascular: No carotid bruit.  Cardiovascular:     Rate and Rhythm: Normal rate.  Pulmonary:     Effort: Pulmonary effort is normal.  Skin:    General: Skin is warm and dry.  Neurological:     Mental Status: He is alert and oriented to person, place, and time.  Psychiatric:        Mood and Affect: Mood normal.        Behavior: Behavior normal.        Thought Content: Thought content normal.        Judgment: Judgment normal.    BP (!) 171/92   Pulse 88   Ht 6\' 8"  (2.032 m)   Wt 218 lb (98.9 kg)   BMI 23.95 kg/m   Past Medical History:  Diagnosis Date   Cataract    Chicken pox    GERD (gastroesophageal reflux disease)    Hyperlipidemia    PAD (peripheral artery disease)  (HCC)    Pain in both lower extremities 11/23/2016   Peripheral vascular disease (HCC)    Pre-diabetes    Seizures ([place])    None since age 60    Social History   Socioeconomic History   Marital status: Married    Spouse name: Not on file   Number of children: Not on file   Years of education: Not on file   Highest education level: Not on file  Occupational History   Not on file  Tobacco Use   Smoking status: Former    Packs/day: 1.00    Years: 50.00    Pack years: 50.00    Types: Cigarettes    Quit date: 11/21/2019    Years since quitting: 1.4   Smokeless tobacco: Never  Vaping Use   Vaping Use: Never used  Substance and Sexual Activity   Alcohol use: Not Currently    Alcohol/week: 0.0 standard drinks   Drug use: No   Sexual activity: Not on file  Other Topics Concern   Not on file  Social History Narrative   Retired.   Stays busy with household work, Surveyor, mining.   Married for 3 years.   Grown children.  Enjoys reading.    Social Determinants of Health   Financial Resource Strain: Not on file  Food Insecurity: Not on file  Transportation Needs: Not on file  Physical Activity: Not on file  Stress: Not on file  Social Connections: Not on file  Intimate Partner Violence: Not on file    Past Surgical History:  Procedure Laterality Date   BACK SURGERY     COLONOSCOPY WITH PROPOFOL N/A 02/07/2021   Procedure: COLONOSCOPY WITH PROPOFOL;  Surgeon: Jonathon Bellows, MD;  Location: [facility] END[facility]COPY;  Service: Gastroenterology;  Laterality: N/A;   LAMINECTOMY  1987   LOWER EXTREMITY ANGIOGRAPHY Left 05/02/2017   Procedure: Lower Extremity Angiography;  Surgeon: Algernon Huxley, MD;  Location: [place] [facility];  Service: Cardiovascular;  Laterality: Left;   LOWER EXTREMITY ANGIOGRAPHY Left 11/16/2019   Procedure: LOWER EXTREMITY ANGIOGRAPHY;  Surgeon: Algernon Huxley, MD;  Location: [place] [facility];  Service: Cardiovascular;  Laterality: Left;   LOWER EXTREMITY  ANGIOGRAPHY Left 04/11/2020   Procedure: LOWER EXTREMITY ANGIOGRAPHY;  Surgeon: Algernon Huxley, MD;  Location: [place] [facility];  Service: Cardiovascular;  Laterality: Left;   LOWER EXTREMITY ANGIOGRAPHY Left 04/12/2020   Procedure: Lower Extremity Angiography;  Surgeon: Algernon Huxley, MD;  Location: [place] [facility];  Service: Cardiovascular;  Laterality: Left;   TONSILLECTOMY      Family History  Problem Relation Age of Onset   Heart disease Mother        Arrthymia    Stroke Father        Deceased   Dementia Father    Heart disease Brother        Myocardial infarction   Colon cancer Neg Hx     Allergies  Allergen Reactions   Fentanyl Other (See Comments)    chills   Prozac [Fluoxetine Hcl] Other (See Comments)    Extreme depression   Statins Other (See Comments)    Flu like symptoms    CBC Latest Ref Rng & Units 04/13/2020 04/12/2020 04/12/2020  WBC 4.0 - 10.5 K/uL 10.8(H) 16.1(H) 17.6(H)  Hemoglobin 13.0 - 17.0 g/dL 12.7(L) 13.1 13.2  Hematocrit 39.0 - 52.0 % 37.1(L) 38.0(L) 39.4  Platelets 150 - 400 K/uL 146(L) 157 178      CMP     Component Value Date/Time   NA 139 04/13/2020 0723   K 4.5 04/13/2020 0723   CL 102 04/13/2020 0723   CO2 30 04/13/2020 0723   GLUCOSE 114 (H) 04/13/2020 0723   BUN 10 04/13/2020 0723   CREATININE 1.08 04/13/2020 0723   CALCIUM 8.6 (L) 04/13/2020 0723   PROT 6.5 03/07/2020 0856   ALBUMIN 4.0 03/07/2020 0856   AST 18 03/07/2020 0856   ALT 20 03/07/2020 0856   ALKPHOS 93 03/07/2020 0856   BILITOT 0.5 03/07/2020 0856   GFRNONAA >60 04/13/2020 0723   GFRAA >60 04/13/2020 0723     No results found.     Assessment & Plan:   1. Atherosclerosis of native artery of left lower extremity with intermittent claudication (HCC) Peripheral arterial disease remains fairly stable.  The patient does note a shortening of ambulation distance but this may be more so related to exhaustion he has been suffering from lately versus decreased  perfusion.  We will have the patient return in 6 months however if he begins to have worsening claudication or rest pain he should contact our office for sooner evaluation.  2. Stenosis of left subclavian artery (HCC) Left subclavian artery  stenosis stable.  The patient denies any worsening dizziness or other issues concerning for worsening subclavian stenosis.  We will continue to monitor.  Patient return in 6 months for noninvasive studies.  3. Hyperlipidemia, unspecified hyperlipidemia type Continue statin as ordered and reviewed, no changes at this time    Current Outpatient Medications on File Prior to Visit  Medication Sig Dispense Refill   albuterol (VENTOLIN HFA) 108 (90 Base) MCG/ACT inhaler Inhale 2 puffs into the lungs every 6 (six) hours as needed for shortness of breath. 8 g 0   apixaban (ELIQUIS) 5 MG TABS tablet Take 1 tablet (5 mg total) by mouth 2 (two) times daily. 180 tablet 3   ASPIRIN LOW DOSE 81 MG EC tablet TAKE 1 TABLET BY MOUTH EVERY DAY 90 tablet 3   diphenhydrAMINE (BENADRYL) 25 MG tablet Take 25 mg by mouth daily as needed for itching.      esomeprazole (NEXIUM) 20 MG capsule Take 20 mg by mouth daily as needed (heartburn).      ibuprofen (ADVIL) 200 MG tablet Take 200-400 mg by mouth every 6 (six) hours as needed for fever or mild pain.      losartan (COZAAR) 50 MG tablet Take 1 tablet (50 mg total) by mouth daily. 90 tablet 3   Multiple Vitamins-Minerals (MULTIVITAMIN WITH MINERALS) tablet Take 1 tablet by mouth daily.     SYMBICORT 160-4.5 MCG/ACT inhaler TAKE 2 PUFFS BY MOUTH TWICE A DAY 10.2 each 0   No current facility-administered medications on file prior to visit.    There are no Patient Instructions on file for this visit. No follow-ups on file.   Kris Hartmann, NP

## 2021-04-17 NOTE — Telephone Encounter (Signed)
Left detail message on VM of pt's recent results okay by DPR, Dr. Rockey Situ advised   "Stress test  Low risk study, no ischemia  Normal ejection fraction "  At this time, no further recommendations or medications changes, advised to call office for any concerns or questions, otherwise will see at next visit.   Results released to MyChart by Dr. Rockey Situ with his advise

## 2021-04-19 ENCOUNTER — Other Ambulatory Visit (INDEPENDENT_AMBULATORY_CARE_PROVIDER_SITE_OTHER): Payer: Medicare Other

## 2021-04-19 ENCOUNTER — Other Ambulatory Visit: Payer: Self-pay

## 2021-04-19 DIAGNOSIS — E785 Hyperlipidemia, unspecified: Secondary | ICD-10-CM

## 2021-04-19 DIAGNOSIS — R7303 Prediabetes: Secondary | ICD-10-CM

## 2021-04-19 LAB — HEMOGLOBIN A1C: Hgb A1c MFr Bld: 6.1 % (ref 4.6–6.5)

## 2021-04-19 LAB — LIPID PANEL
Cholesterol: 207 mg/dL — ABNORMAL HIGH (ref 0–200)
HDL: 41.2 mg/dL (ref >39.00)
LDL Cholesterol: 132 mg/dL — ABNORMAL HIGH (ref 0–99)
NonHDL: 165.37
Total CHOL/HDL Ratio: 5
Triglycerides: 167 mg/dL — ABNORMAL HIGH (ref 0.0–149.0)
VLDL: 33.4 mg/dL (ref 0.0–40.0)

## 2021-04-19 LAB — CBC
HCT: 42.2 % (ref 39.0–52.0)
Hemoglobin: 14 g/dL (ref 13.0–17.0)
MCHC: 33.1 g/dL (ref 30.0–36.0)
MCV: 93.3 fl (ref 78.0–100.0)
Platelets: 241 10*3/uL (ref 150.0–400.0)
RBC: 4.52 Mil/uL (ref 4.22–5.81)
RDW: 14.1 % (ref 11.5–15.5)
WBC: 9.5 10*3/uL (ref 4.0–10.5)

## 2021-04-19 LAB — COMPREHENSIVE METABOLIC PANEL
ALT: 18 U/L (ref 0–53)
AST: 18 U/L (ref 0–37)
Albumin: 4 g/dL (ref 3.5–5.2)
Alkaline Phosphatase: 93 U/L (ref 39–117)
BUN: 12 mg/dL (ref 6–23)
CO2: 29 mEq/L (ref 19–32)
Calcium: 9.2 mg/dL (ref 8.4–10.5)
Chloride: 104 mEq/L (ref 96–112)
Creatinine, Ser: 1.28 mg/dL (ref 0.40–1.50)
GFR: 56.02 mL/min — ABNORMAL LOW (ref >60.00)
Glucose, Bld: 97 mg/dL (ref 70–99)
Potassium: 3.9 mEq/L (ref 3.5–5.1)
Sodium: 141 mEq/L (ref 135–145)
Total Bilirubin: 0.4 mg/dL (ref 0.2–1.2)
Total Protein: 6.6 g/dL (ref 6.0–8.3)

## 2021-04-20 ENCOUNTER — Other Ambulatory Visit (INDEPENDENT_AMBULATORY_CARE_PROVIDER_SITE_OTHER): Payer: Self-pay | Admitting: Vascular Surgery

## 2021-05-04 ENCOUNTER — Other Ambulatory Visit: Payer: Self-pay

## 2021-05-04 ENCOUNTER — Other Ambulatory Visit (INDEPENDENT_AMBULATORY_CARE_PROVIDER_SITE_OTHER): Payer: Medicare Other

## 2021-05-04 DIAGNOSIS — Z79899 Other long term (current) drug therapy: Secondary | ICD-10-CM | POA: Diagnosis not present

## 2021-05-05 LAB — BASIC METABOLIC PANEL
BUN/Creatinine Ratio: 8 — ABNORMAL LOW (ref 10–24)
BUN: 10 mg/dL (ref 8–27)
CO2: 22 mmol/L (ref 20–29)
Calcium: 9.2 mg/dL (ref 8.6–10.2)
Chloride: 105 mmol/L (ref 96–106)
Creatinine, Ser: 1.24 mg/dL (ref 0.76–1.27)
Glucose: 93 mg/dL (ref 70–99)
Potassium: 4.3 mmol/L (ref 3.5–5.2)
Sodium: 144 mmol/L (ref 134–144)
eGFR: 62 mL/min/{1.73_m2} (ref >59)

## 2021-05-08 ENCOUNTER — Ambulatory Visit (INDEPENDENT_AMBULATORY_CARE_PROVIDER_SITE_OTHER): Payer: Medicare Other

## 2021-05-08 ENCOUNTER — Other Ambulatory Visit: Payer: Self-pay

## 2021-05-08 DIAGNOSIS — I201 Angina pectoris with documented spasm: Secondary | ICD-10-CM

## 2021-05-08 DIAGNOSIS — R0602 Shortness of breath: Secondary | ICD-10-CM | POA: Diagnosis not present

## 2021-05-08 DIAGNOSIS — R0789 Other chest pain: Secondary | ICD-10-CM

## 2021-05-08 LAB — ECHOCARDIOGRAM COMPLETE
AR max vel: 3.84 cm2
AV Area VTI: 4.05 cm2
AV Area mean vel: 3.42 cm2
AV Mean grad: 2 mmHg
AV Peak grad: 4.2 mmHg
Ao pk vel: 1.03 m/s
Area-P 1/2: 4.21 cm2
Calc EF: 41.4 %
S' Lateral: 3.3 cm
Single Plane A2C EF: 42.1 %
Single Plane A4C EF: 40.4 %

## 2021-05-09 DIAGNOSIS — M5136 Other intervertebral disc degeneration, lumbar region: Secondary | ICD-10-CM | POA: Diagnosis not present

## 2021-05-09 DIAGNOSIS — M9905 Segmental and somatic dysfunction of pelvic region: Secondary | ICD-10-CM | POA: Diagnosis not present

## 2021-05-09 DIAGNOSIS — M9903 Segmental and somatic dysfunction of lumbar region: Secondary | ICD-10-CM | POA: Diagnosis not present

## 2021-05-09 DIAGNOSIS — M5416 Radiculopathy, lumbar region: Secondary | ICD-10-CM | POA: Diagnosis not present

## 2021-05-12 ENCOUNTER — Telehealth: Payer: Self-pay

## 2021-05-12 NOTE — Telephone Encounter (Signed)
Left detail message on VM of pt's recent results okay by DPR, Dr. Rockey Situ advised   "Echocardiogram  Low normal ejection fraction estimated 50%  Normal RV function no significant valve disease  Grossly nothing to explain shortness of breath symptoms, will wait on stress testing "  "Stable lab work within normal limits "  At this time, no further recommendations or medications changes, advised to call office for any concerns or questions, otherwise will see at next visit in March.

## 2021-06-03 ENCOUNTER — Other Ambulatory Visit: Payer: Self-pay | Admitting: Primary Care

## 2021-06-03 DIAGNOSIS — J432 Centrilobular emphysema: Secondary | ICD-10-CM

## 2021-06-06 DIAGNOSIS — M5416 Radiculopathy, lumbar region: Secondary | ICD-10-CM | POA: Diagnosis not present

## 2021-06-06 DIAGNOSIS — M9903 Segmental and somatic dysfunction of lumbar region: Secondary | ICD-10-CM | POA: Diagnosis not present

## 2021-06-06 DIAGNOSIS — M5136 Other intervertebral disc degeneration, lumbar region: Secondary | ICD-10-CM | POA: Diagnosis not present

## 2021-06-06 DIAGNOSIS — M9905 Segmental and somatic dysfunction of pelvic region: Secondary | ICD-10-CM | POA: Diagnosis not present

## 2021-07-04 DIAGNOSIS — M5136 Other intervertebral disc degeneration, lumbar region: Secondary | ICD-10-CM | POA: Diagnosis not present

## 2021-07-04 DIAGNOSIS — M9903 Segmental and somatic dysfunction of lumbar region: Secondary | ICD-10-CM | POA: Diagnosis not present

## 2021-07-04 DIAGNOSIS — M9905 Segmental and somatic dysfunction of pelvic region: Secondary | ICD-10-CM | POA: Diagnosis not present

## 2021-07-04 DIAGNOSIS — M5416 Radiculopathy, lumbar region: Secondary | ICD-10-CM | POA: Diagnosis not present

## 2021-07-23 HISTORY — PX: TOOTH EXTRACTION: SUR596

## 2021-08-01 DIAGNOSIS — M5136 Other intervertebral disc degeneration, lumbar region: Secondary | ICD-10-CM | POA: Diagnosis not present

## 2021-08-01 DIAGNOSIS — M9903 Segmental and somatic dysfunction of lumbar region: Secondary | ICD-10-CM | POA: Diagnosis not present

## 2021-08-01 DIAGNOSIS — M62830 Muscle spasm of back: Secondary | ICD-10-CM | POA: Diagnosis not present

## 2021-08-01 DIAGNOSIS — M9905 Segmental and somatic dysfunction of pelvic region: Secondary | ICD-10-CM | POA: Diagnosis not present

## 2021-08-28 ENCOUNTER — Other Ambulatory Visit (INDEPENDENT_AMBULATORY_CARE_PROVIDER_SITE_OTHER): Payer: Self-pay | Admitting: Nurse Practitioner

## 2021-08-28 ENCOUNTER — Encounter (INDEPENDENT_AMBULATORY_CARE_PROVIDER_SITE_OTHER): Payer: Self-pay

## 2021-08-28 DIAGNOSIS — Z9889 Other specified postprocedural states: Secondary | ICD-10-CM

## 2021-08-28 DIAGNOSIS — I6523 Occlusion and stenosis of bilateral carotid arteries: Secondary | ICD-10-CM

## 2021-08-28 DIAGNOSIS — I771 Stricture of artery: Secondary | ICD-10-CM

## 2021-08-28 DIAGNOSIS — I739 Peripheral vascular disease, unspecified: Secondary | ICD-10-CM

## 2021-08-28 NOTE — Telephone Encounter (Signed)
Let's get him in with ABIs to see me or Dew this week

## 2021-08-29 ENCOUNTER — Ambulatory Visit (INDEPENDENT_AMBULATORY_CARE_PROVIDER_SITE_OTHER): Payer: Medicare Other | Admitting: Vascular Surgery

## 2021-08-29 ENCOUNTER — Encounter (INDEPENDENT_AMBULATORY_CARE_PROVIDER_SITE_OTHER): Payer: Self-pay | Admitting: Vascular Surgery

## 2021-08-29 ENCOUNTER — Ambulatory Visit (INDEPENDENT_AMBULATORY_CARE_PROVIDER_SITE_OTHER): Payer: Medicare Other

## 2021-08-29 ENCOUNTER — Other Ambulatory Visit (INDEPENDENT_AMBULATORY_CARE_PROVIDER_SITE_OTHER): Payer: Self-pay | Admitting: Nurse Practitioner

## 2021-08-29 ENCOUNTER — Other Ambulatory Visit: Payer: Self-pay

## 2021-08-29 ENCOUNTER — Telehealth (INDEPENDENT_AMBULATORY_CARE_PROVIDER_SITE_OTHER): Payer: Self-pay

## 2021-08-29 VITALS — BP 166/79 | HR 101 | Ht 76.0 in | Wt 217.0 lb

## 2021-08-29 DIAGNOSIS — I998 Other disorder of circulatory system: Secondary | ICD-10-CM

## 2021-08-29 DIAGNOSIS — I6523 Occlusion and stenosis of bilateral carotid arteries: Secondary | ICD-10-CM | POA: Diagnosis not present

## 2021-08-29 DIAGNOSIS — M9903 Segmental and somatic dysfunction of lumbar region: Secondary | ICD-10-CM | POA: Diagnosis not present

## 2021-08-29 DIAGNOSIS — I739 Peripheral vascular disease, unspecified: Secondary | ICD-10-CM

## 2021-08-29 DIAGNOSIS — Z9889 Other specified postprocedural states: Secondary | ICD-10-CM

## 2021-08-29 DIAGNOSIS — M9905 Segmental and somatic dysfunction of pelvic region: Secondary | ICD-10-CM | POA: Diagnosis not present

## 2021-08-29 DIAGNOSIS — E785 Hyperlipidemia, unspecified: Secondary | ICD-10-CM | POA: Diagnosis not present

## 2021-08-29 DIAGNOSIS — I771 Stricture of artery: Secondary | ICD-10-CM

## 2021-08-29 DIAGNOSIS — M62830 Muscle spasm of back: Secondary | ICD-10-CM | POA: Diagnosis not present

## 2021-08-29 DIAGNOSIS — M5136 Other intervertebral disc degeneration, lumbar region: Secondary | ICD-10-CM | POA: Diagnosis not present

## 2021-08-29 DIAGNOSIS — I70212 Atherosclerosis of native arteries of extremities with intermittent claudication, left leg: Secondary | ICD-10-CM

## 2021-08-29 NOTE — Telephone Encounter (Signed)
Patient was seen in office today and it was explained that he was to do an angio with Dr. Lucky Cowboy. Patient was scheduled for a left leg angio on 08/30/21 with a 10:15 am arrival time to the [facility]. A message was left for a return call as the patient left the office without being given his information.

## 2021-08-29 NOTE — Telephone Encounter (Signed)
Spoke with the patient and went over the pre-procedure instructions, time and medications and patient understood.

## 2021-08-29 NOTE — Assessment & Plan Note (Signed)
Duplex shows 1 to 39% stenosis bilaterally without significant progression previous studies.  No change in medical regimen.  Recheck in 1 year.

## 2021-08-29 NOTE — Assessment & Plan Note (Signed)
lipid control important in reducing the progression of atherosclerotic disease. Continue statin therapy  

## 2021-08-29 NOTE — H&P (View-Only) (Signed)
MRN : [id number]  Nathaniel [name] is a 71 y.o. ([birth year]) male who presents with chief complaint of  Chief Complaint  Patient presents with   Follow-up    6 mo  abi   .  History of Present Illness: Patient returns today in follow up of multiple vascular issues.  About a week ago, he began having abrupt pain and numbness in his left foot and lower leg.  He can only walk short distances now without the pain.  No open wounds or infection.  He had been in a crouching position for an extended period of time and does admit to occasionally missing doses of his Eliquis.  Noninvasive studies today show occlusion of his left SFA and popliteal intervention suspected by marked drop in his left ABI of 0.47.  Right ABI remains normal and stable at 1.11. He is also followed for carotid disease.  No focal neurologic symptoms.  Duplex shows stable 1 to 39% ICA stenosis bilaterally.  Current Outpatient Medications  Medication Sig Dispense Refill   albuterol (VENTOLIN HFA) 108 (90 Base) MCG/ACT inhaler Inhale 2 puffs into the lungs every 6 (six) hours as needed for shortness of breath. 8 g 0   ASPIRIN LOW DOSE 81 MG EC tablet TAKE 1 TABLET BY MOUTH EVERY DAY 90 tablet 3   budesonide-formoterol (SYMBICORT) 160-4.5 MCG/ACT inhaler TAKE 2 PUFFS BY MOUTH TWICE A DAY 10.2 each 5   diphenhydrAMINE (BENADRYL) 25 MG tablet Take 25 mg by mouth daily as needed for itching.      ELIQUIS 5 MG TABS tablet TAKE 1 TABLET BY MOUTH TWICE A DAY 60 tablet 11   esomeprazole (NEXIUM) 20 MG capsule Take 20 mg by mouth daily as needed (heartburn).      ibuprofen (ADVIL) 200 MG tablet Take 200-400 mg by mouth every 6 (six) hours as needed for fever or mild pain.      Multiple Vitamins-Minerals (MULTIVITAMIN WITH MINERALS) tablet Take 1 tablet by mouth daily.     losartan (COZAAR) 50 MG tablet Take 1 tablet (50 mg total) by mouth daily. 90 tablet 3   No current facility-administered medications for this visit.    Past  Medical History:  Diagnosis Date   Cataract    Chicken pox    GERD (gastroesophageal reflux disease)    Hyperlipidemia    PAD (peripheral artery disease) (HCC)    Pain in both lower extremities 11/23/2016   Peripheral vascular disease (HCC)    Pre-diabetes    Seizures (HCC)    None since age 22    Past Surgical History:  Procedure Laterality Date   BACK SURGERY     COLONOSCOPY WITH PROPOFOL N/A 02/07/2021   Procedure: COLONOSCOPY WITH PROPOFOL;  Surgeon: Jonathon Bellows, MD;  Location: [facility] ENDOSCOPY;  Service: Gastroenterology;  Laterality: N/A;   LAMINECTOMY  1987   LOWER EXTREMITY ANGIOGRAPHY Left 05/02/2017   Procedure: Lower Extremity Angiography;  Surgeon: Algernon Huxley, MD;  Location: [place] [facility];  Service: Cardiovascular;  Laterality: Left;   LOWER EXTREMITY ANGIOGRAPHY Left 11/16/2019   Procedure: LOWER EXTREMITY ANGIOGRAPHY;  Surgeon: Algernon Huxley, MD;  Location: [place] [facility];  Service: Cardiovascular;  Laterality: Left;   LOWER EXTREMITY ANGIOGRAPHY Left 04/11/2020   Procedure: LOWER EXTREMITY ANGIOGRAPHY;  Surgeon: Algernon Huxley, MD;  Location: [place] [facility];  Service: Cardiovascular;  Laterality: Left;   LOWER EXTREMITY ANGIOGRAPHY Left 04/12/2020   Procedure: Lower Extremity Angiography;  Surgeon: Algernon Huxley,  MD;  Location: [place] [facility];  Service: Cardiovascular;  Laterality: Left;   TONSILLECTOMY       Social History   Tobacco Use   Smoking status: Former    Packs/day: 1.00    Years: 50.00    Pack years: 50.00    Types: Cigarettes    Quit date: 11/21/2019    Years since quitting: 1.7   Smokeless tobacco: Never  Vaping Use   Vaping Use: Never used  Substance Use Topics   Alcohol use: Not Currently    Alcohol/week: 0.0 standard drinks   Drug use: No      Family History  Problem Relation Age of Onset   Heart disease Mother        Arrthymia    Stroke Father        Deceased   Dementia Father    Heart disease Brother         Myocardial infarction   Colon cancer Neg Hx      Allergies  Allergen Reactions   Fentanyl Other (See Comments)    chills   Prozac [Fluoxetine Hcl] Other (See Comments)    Extreme depression   Statins Other (See Comments)    Flu like symptoms     REVIEW OF SYSTEMS (Negative unless checked)  Constitutional: [] Weight loss  [] Fever  [] Chills Cardiac: [] Chest pain   [] Chest pressure   [] Palpitations   [] Shortness of breath when laying flat   [] Shortness of breath at rest   [] Shortness of breath with exertion. Vascular:  [x] Pain in legs with walking   [] Pain in legs at rest   [] Pain in legs when laying flat   [x] Claudication   [] Pain in feet when walking  [x] Pain in feet at rest  [] Pain in feet when laying flat   [] History of DVT   [] Phlebitis   [] Swelling in legs   [] Varicose veins   [] Non-healing ulcers Pulmonary:   [] Uses home oxygen   [] Productive cough   [] Hemoptysis   [] Wheeze  [] COPD   [] Asthma Neurologic:  [] Dizziness  [] Blackouts   [] Seizures   [] History of stroke   [] History of TIA  [] Aphasia   [] Temporary blindness   [] Dysphagia   [] Weakness or numbness in arms   [x] Weakness or numbness in legs Musculoskeletal:  [x] Arthritis   [] Joint swelling   [] Joint pain   [] Low back pain Hematologic:  [] Easy bruising  [] Easy bleeding   [] Hypercoagulable state   [] Anemic   Gastrointestinal:  [] Blood in stool   [] Vomiting blood  [] Gastroesophageal reflux/heartburn   [] Abdominal pain Genitourinary:  [] Chronic kidney disease   [] Difficult urination  [] Frequent urination  [] Burning with urination   [] Hematuria Skin:  [] Rashes   [] Ulcers   [] Wounds Psychological:  [] History of anxiety   []  History of major depression.  Physical Examination  BP (!) 166/79    Pulse (!) 101    Ht 6\' 4"  (1.93 m)    Wt 217 lb (98.4 kg)    BMI 26.41 kg/m  Gen:  WD/WN, NAD Head: [place]/AT, No temporalis wasting. Ear/Nose/Throat: Hearing grossly intact, nares w/o erythema or drainage Eyes: Conjunctiva clear. Sclera  non-icteric Neck: Supple.  Trachea midline Pulmonary:  Good air movement, no use of accessory muscles.  Cardiac: RRR, no JVD Vascular:  Vessel Right Left  Radial Palpable Palpable                          PT Palpable Not Palpable  DP Palpable Not  Palpable   Gastrointestinal: soft, non-tender/non-distended. No guarding/reflex.  Musculoskeletal: M/S 5/5 throughout.  No deformity or atrophy. No edema. Neurologic: Sensation grossly intact in extremities.  Symmetrical.  Speech is fluent.  Psychiatric: Judgment intact, Mood & affect appropriate for pt's clinical situation. Dermatologic: No rashes or ulcers noted.  No cellulitis or open wounds.      Labs No results found for this or any previous visit (from the past 2160 hour(s)).  Radiology No results found.  Assessment/Plan  Ischemia of left lower extremity ABIs today are normal and stable on the right but have had a marked drop on the left down to 0.47.  He has had about 1 week of symptoms which would now be consistent with rest pain.  He has recurrent ischemia of the left lower extremity, and we will plan on angiogram of the left lower extremity with intervention tomorrow.  Risks and benefits are discussed.  We discussed that it is likely that he will need to stay in the hospital for at least 1 night following the procedure although that is not entirely sure at this point.  We discussed the critical and limb threatening nature of the situation.  Patient and his wife voiced their understanding and agreed to proceed.  Hyperlipidemia lipid control important in reducing the progression of atherosclerotic disease. Continue statin therapy   Stenosis of carotid artery Duplex shows 1 to 39% stenosis bilaterally without significant progression previous studies.  No change in medical regimen.  Recheck in 1 year.    Leotis Pain, MD  08/29/2021 3:33 PM    This note was created with Dragon medical transcription system.  Any errors  from dictation are purely unintentional

## 2021-08-29 NOTE — Progress Notes (Signed)
MRN : [id number]  Nathaniel [name] is a 71 y.o. ([birth year]) male who presents with chief complaint of  Chief Complaint  Patient presents with   Follow-up    6 mo  abi   .  History of Present Illness: Patient returns today in follow up of multiple vascular issues.  About a week ago, he began having abrupt pain and numbness in his left foot and lower leg.  He can only walk short distances now without the pain.  No open wounds or infection.  He had been in a crouching position for an extended period of time and does admit to occasionally missing doses of his Eliquis.  Noninvasive studies today show occlusion of his left SFA and popliteal intervention suspected by marked drop in his left ABI of 0.47.  Right ABI remains normal and stable at 1.11. He is also followed for carotid disease.  No focal neurologic symptoms.  Duplex shows stable 1 to 39% ICA stenosis bilaterally.  Current Outpatient Medications  Medication Sig Dispense Refill   albuterol (VENTOLIN HFA) 108 (90 Base) MCG/ACT inhaler Inhale 2 puffs into the lungs every 6 (six) hours as needed for shortness of breath. 8 g 0   ASPIRIN LOW DOSE 81 MG EC tablet TAKE 1 TABLET BY MOUTH EVERY DAY 90 tablet 3   budesonide-formoterol (SYMBICORT) 160-4.5 MCG/ACT inhaler TAKE 2 PUFFS BY MOUTH TWICE A DAY 10.2 each 5   diphenhydrAMINE (BENADRYL) 25 MG tablet Take 25 mg by mouth daily as needed for itching.      ELIQUIS 5 MG TABS tablet TAKE 1 TABLET BY MOUTH TWICE A DAY 60 tablet 11   esomeprazole (NEXIUM) 20 MG capsule Take 20 mg by mouth daily as needed (heartburn).      ibuprofen (ADVIL) 200 MG tablet Take 200-400 mg by mouth every 6 (six) hours as needed for fever or mild pain.      Multiple Vitamins-Minerals (MULTIVITAMIN WITH MINERALS) tablet Take 1 tablet by mouth daily.     losartan (COZAAR) 50 MG tablet Take 1 tablet (50 mg total) by mouth daily. 90 tablet 3   No current facility-administered medications for this visit.    Past  Medical History:  Diagnosis Date   Cataract    Chicken pox    GERD (gastroesophageal reflux disease)    Hyperlipidemia    PAD (peripheral artery disease) (HCC)    Pain in both lower extremities 11/23/2016   Peripheral vascular disease (HCC)    Pre-diabetes    Seizures (HCC)    None since age 63    Past Surgical History:  Procedure Laterality Date   BACK SURGERY     COLONOSCOPY WITH PROPOFOL N/A 02/07/2021   Procedure: COLONOSCOPY WITH PROPOFOL;  Surgeon: Jonathon Bellows, MD;  Location: [facility] ENDOSCOPY;  Service: Gastroenterology;  Laterality: N/A;   LAMINECTOMY  1987   LOWER EXTREMITY ANGIOGRAPHY Left 05/02/2017   Procedure: Lower Extremity Angiography;  Surgeon: Algernon Huxley, MD;  Location: [facility];  Service: Cardiovascular;  Laterality: Left;   LOWER EXTREMITY ANGIOGRAPHY Left 11/16/2019   Procedure: LOWER EXTREMITY ANGIOGRAPHY;  Surgeon: Algernon Huxley, MD;  Location: [facility];  Service: Cardiovascular;  Laterality: Left;   LOWER EXTREMITY ANGIOGRAPHY Left 04/11/2020   Procedure: LOWER EXTREMITY ANGIOGRAPHY;  Surgeon: Algernon Huxley, MD;  Location: [facility];  Service: Cardiovascular;  Laterality: Left;   LOWER EXTREMITY ANGIOGRAPHY Left 04/12/2020   Procedure: Lower Extremity Angiography;  Surgeon: Algernon Huxley,  MD;  Location: [facility];  Service: Cardiovascular;  Laterality: Left;   TONSILLECTOMY       Social History   Tobacco Use   Smoking status: Former    Packs/day: 1.00    Years: 50.00    Pack years: 50.00    Types: Cigarettes    Quit date: 11/21/2019    Years since quitting: 1.7   Smokeless tobacco: Never  Vaping Use   Vaping Use: Never used  Substance Use Topics   Alcohol use: Not Currently    Alcohol/week: 0.0 standard drinks   Drug use: No      Family History  Problem Relation Age of Onset   Heart disease Mother        Arrthymia    Stroke Father        Deceased   Dementia Father    Heart disease Brother         Myocardial infarction   Colon cancer Neg Hx      Allergies  Allergen Reactions   Fentanyl Other (See Comments)    chills   Prozac [Fluoxetine Hcl] Other (See Comments)    Extreme depression   Statins Other (See Comments)    Flu like symptoms     REVIEW OF SYSTEMS (Negative unless checked)  Constitutional: [] Weight loss  [] Fever  [] Chills Cardiac: [] Chest pain   [] Chest pressure   [] Palpitations   [] Shortness of breath when laying flat   [] Shortness of breath at rest   [] Shortness of breath with exertion. Vascular:  [x] Pain in legs with walking   [] Pain in legs at rest   [] Pain in legs when laying flat   [x] Claudication   [] Pain in feet when walking  [x] Pain in feet at rest  [] Pain in feet when laying flat   [] History of DVT   [] Phlebitis   [] Swelling in legs   [] Varicose veins   [] Non-healing ulcers Pulmonary:   [] Uses home oxygen   [] Productive cough   [] Hemoptysis   [] Wheeze  [] COPD   [] Asthma Neurologic:  [] Dizziness  [] Blackouts   [] Seizures   [] History of stroke   [] History of TIA  [] Aphasia   [] Temporary blindness   [] Dysphagia   [] Weakness or numbness in arms   [x] Weakness or numbness in legs Musculoskeletal:  [x] Arthritis   [] Joint swelling   [] Joint pain   [] Low back pain Hematologic:  [] Easy bruising  [] Easy bleeding   [] Hypercoagulable state   [] Anemic   Gastrointestinal:  [] Blood in stool   [] Vomiting blood  [] Gastroesophageal reflux/heartburn   [] Abdominal pain Genitourinary:  [] Chronic kidney disease   [] Difficult urination  [] Frequent urination  [] Burning with urination   [] Hematuria Skin:  [] Rashes   [] Ulcers   [] Wounds Psychological:  [] History of anxiety   []  History of major depression.  Physical Examination  BP (!) 166/79    Pulse (!) 101    Ht 6\' 4"  (1.93 m)    Wt 217 lb (98.4 kg)    BMI 26.41 kg/m  Gen:  WD/WN, NAD Head: [place]/AT, No temporalis wasting. Ear/Nose/Throat: Hearing grossly intact, nares w/o erythema or drainage Eyes: Conjunctiva clear. Sclera  non-icteric Neck: Supple.  Trachea midline Pulmonary:  Good air movement, no use of accessory muscles.  Cardiac: RRR, no JVD Vascular:  Vessel Right Left  Radial Palpable Palpable                          PT Palpable Not Palpable  DP Palpable Not  Palpable   Gastrointestinal: soft, non-tender/non-distended. No guarding/reflex.  Musculoskeletal: M/S 5/5 throughout.  No deformity or atrophy. No edema. Neurologic: Sensation grossly intact in extremities.  Symmetrical.  Speech is fluent.  Psychiatric: Judgment intact, Mood & affect appropriate for pt's clinical situation. Dermatologic: No rashes or ulcers noted.  No cellulitis or open wounds.      Labs No results found for this or any previous visit (from the past 2160 hour(s)).  Radiology No results found.  Assessment/Plan  Ischemia of left lower extremity ABIs today are normal and stable on the right but have had a marked drop on the left down to 0.47.  He has had about 1 week of symptoms which would now be consistent with rest pain.  He has recurrent ischemia of the left lower extremity, and we will plan on angiogram of the left lower extremity with intervention tomorrow.  Risks and benefits are discussed.  We discussed that it is likely that he will need to stay in the hospital for at least 1 night following the procedure although that is not entirely sure at this point.  We discussed the critical and limb threatening nature of the situation.  Patient and his wife voiced their understanding and agreed to proceed.  Hyperlipidemia lipid control important in reducing the progression of atherosclerotic disease. Continue statin therapy   Stenosis of carotid artery Duplex shows 1 to 39% stenosis bilaterally without significant progression previous studies.  No change in medical regimen.  Recheck in 1 year.    Leotis Pain, MD  08/29/2021 3:33 PM    This note was created with Dragon medical transcription system.  Any errors  from dictation are purely unintentional

## 2021-08-29 NOTE — Assessment & Plan Note (Signed)
ABIs today are normal and stable on the right but have had a marked drop on the left down to 0.47.  He has had about 1 week of symptoms which would now be consistent with rest pain.  He has recurrent ischemia of the left lower extremity, and we will plan on angiogram of the left lower extremity with intervention tomorrow.  Risks and benefits are discussed.  We discussed that it is likely that he will need to stay in the hospital for at least 1 night following the procedure although that is not entirely sure at this point.  We discussed the critical and limb threatening nature of the situation.  Patient and his wife voiced their understanding and agreed to proceed.

## 2021-08-30 ENCOUNTER — Inpatient Hospital Stay
Admission: RE | Admit: 2021-08-30 | Discharge: 2021-09-01 | DRG: 271 | Disposition: A | Discharge status: Home / Self Care | Payer: Medicare Other | Attending: Vascular Surgery | Admitting: Vascular Surgery

## 2021-08-30 ENCOUNTER — Other Ambulatory Visit: Payer: Self-pay

## 2021-08-30 ENCOUNTER — Encounter
Admission: RE | Disposition: A | Discharge status: Home / Self Care | Payer: Self-pay | Source: Home / Self Care | Attending: Vascular Surgery

## 2021-08-30 ENCOUNTER — Encounter: Payer: Self-pay | Admitting: Vascular Surgery

## 2021-08-30 ENCOUNTER — Other Ambulatory Visit (INDEPENDENT_AMBULATORY_CARE_PROVIDER_SITE_OTHER): Payer: Self-pay | Admitting: Nurse Practitioner

## 2021-08-30 DIAGNOSIS — Z79899 Other long term (current) drug therapy: Secondary | ICD-10-CM | POA: Diagnosis not present

## 2021-08-30 DIAGNOSIS — Z87891 Personal history of nicotine dependence: Secondary | ICD-10-CM

## 2021-08-30 DIAGNOSIS — Z7982 Long term (current) use of aspirin: Secondary | ICD-10-CM

## 2021-08-30 DIAGNOSIS — Y838 Other surgical procedures as the cause of abnormal reaction of the patient, or of later complication, without mention of misadventure at the time of the procedure: Secondary | ICD-10-CM | POA: Diagnosis present

## 2021-08-30 DIAGNOSIS — R7303 Prediabetes: Secondary | ICD-10-CM | POA: Diagnosis present

## 2021-08-30 DIAGNOSIS — Z8249 Family history of ischemic heart disease and other diseases of the circulatory system: Secondary | ICD-10-CM

## 2021-08-30 DIAGNOSIS — Z95820 Peripheral vascular angioplasty status with implants and grafts: Secondary | ICD-10-CM

## 2021-08-30 DIAGNOSIS — Z888 Allergy status to other drugs, medicaments and biological substances status: Secondary | ICD-10-CM

## 2021-08-30 DIAGNOSIS — E785 Hyperlipidemia, unspecified: Secondary | ICD-10-CM | POA: Diagnosis present

## 2021-08-30 DIAGNOSIS — K219 Gastro-esophageal reflux disease without esophagitis: Secondary | ICD-10-CM | POA: Diagnosis present

## 2021-08-30 DIAGNOSIS — I6523 Occlusion and stenosis of bilateral carotid arteries: Secondary | ICD-10-CM | POA: Diagnosis present

## 2021-08-30 DIAGNOSIS — Z7901 Long term (current) use of anticoagulants: Secondary | ICD-10-CM | POA: Diagnosis not present

## 2021-08-30 DIAGNOSIS — Z885 Allergy status to narcotic agent status: Secondary | ICD-10-CM

## 2021-08-30 DIAGNOSIS — I743 Embolism and thrombosis of arteries of the lower extremities: Secondary | ICD-10-CM | POA: Diagnosis present

## 2021-08-30 DIAGNOSIS — I998 Other disorder of circulatory system: Secondary | ICD-10-CM | POA: Diagnosis not present

## 2021-08-30 DIAGNOSIS — H269 Unspecified cataract: Secondary | ICD-10-CM | POA: Diagnosis present

## 2021-08-30 DIAGNOSIS — I70222 Atherosclerosis of native arteries of extremities with rest pain, left leg: Secondary | ICD-10-CM | POA: Diagnosis not present

## 2021-08-30 DIAGNOSIS — Z7951 Long term (current) use of inhaled steroids: Secondary | ICD-10-CM

## 2021-08-30 DIAGNOSIS — T82868A Thrombosis of vascular prosthetic devices, implants and grafts, initial encounter: Secondary | ICD-10-CM | POA: Diagnosis present

## 2021-08-30 DIAGNOSIS — Z20822 Contact with and (suspected) exposure to covid-19: Secondary | ICD-10-CM | POA: Diagnosis present

## 2021-08-30 DIAGNOSIS — I70212 Atherosclerosis of native arteries of extremities with intermittent claudication, left leg: Secondary | ICD-10-CM

## 2021-08-30 DIAGNOSIS — Z823 Family history of stroke: Secondary | ICD-10-CM

## 2021-08-30 DIAGNOSIS — I70229 Atherosclerosis of native arteries of extremities with rest pain, unspecified extremity: Secondary | ICD-10-CM

## 2021-08-30 HISTORY — PX: LOWER EXTREMITY ANGIOGRAPHY: CATH118251

## 2021-08-30 LAB — CBC
HCT: 38.7 % — ABNORMAL LOW (ref 39.0–52.0)
HCT: 41.1 % (ref 39.0–52.0)
Hemoglobin: 12.8 g/dL — ABNORMAL LOW (ref 13.0–17.0)
Hemoglobin: 13.7 g/dL (ref 13.0–17.0)
MCH: 31.1 pg (ref 26.0–34.0)
MCH: 31.6 pg (ref 26.0–34.0)
MCHC: 33.1 g/dL (ref 30.0–36.0)
MCHC: 33.3 g/dL (ref 30.0–36.0)
MCV: 93.9 fL (ref 80.0–100.0)
MCV: 94.7 fL (ref 80.0–100.0)
Platelets: 261 10*3/uL (ref 150–400)
Platelets: 247 10*3/uL (ref 150–400)
RBC: 4.12 MIL/uL — ABNORMAL LOW (ref 4.22–5.81)
RBC: 4.34 MIL/uL (ref 4.22–5.81)
RDW: 12.7 % (ref 11.5–15.5)
RDW: 12.7 % (ref 11.5–15.5)
WBC: 10.6 10*3/uL — ABNORMAL HIGH (ref 4.0–10.5)
WBC: 15.4 10*3/uL — ABNORMAL HIGH (ref 4.0–10.5)
nRBC: 0 % (ref 0.0–0.2)
nRBC: 0 % (ref 0.0–0.2)

## 2021-08-30 LAB — GLUCOSE, CAPILLARY
Glucose-Capillary: 118 mg/dL — ABNORMAL HIGH (ref 70–99)
Glucose-Capillary: 123 mg/dL — ABNORMAL HIGH (ref 70–99)

## 2021-08-30 LAB — CREATININE, SERUM
Creatinine, Ser: 1.27 mg/dL — ABNORMAL HIGH (ref 0.61–1.24)
GFR, Estimated: >60 mL/min (ref >60)

## 2021-08-30 LAB — RESP PANEL BY RT-PCR (FLU A&B, COVID) ARPGX2
Influenza A by PCR: NEGATIVE
Influenza B by PCR: NEGATIVE
SARS Coronavirus 2 by RT PCR: NEGATIVE

## 2021-08-30 LAB — BUN: BUN: 17 mg/dL (ref 8–23)

## 2021-08-30 LAB — FIBRINOGEN
Fibrinogen: 529 mg/dL — ABNORMAL HIGH (ref 210–475)
Fibrinogen: 420 mg/dL (ref 210–475)

## 2021-08-30 SURGERY — LOWER EXTREMITY ANGIOGRAPHY
Anesthesia: Moderate Sedation | Site: Leg Lower | Laterality: Left

## 2021-08-30 MED ORDER — MIDAZOLAM HCL 2 MG/ML PO SYRP: 8.0000 mg | ORAL_SOLUTION | Freq: Once | ORAL | Status: DC | PRN

## 2021-08-30 MED ORDER — PHENOL 1.4 % MT LIQD
1.0000 | OROMUCOSAL | Status: DC | PRN
  Filled 2021-08-30: qty 177

## 2021-08-30 MED ORDER — PANTOPRAZOLE SODIUM 40 MG PO TBEC
40.0000 mg | DELAYED_RELEASE_TABLET | Freq: Every day | ORAL | Status: DC
  Administered 2021-08-30 – 2021-08-31 (×2): 40 mg via ORAL
  Filled 2021-08-30 (×2): qty 1

## 2021-08-30 MED ORDER — ASPIRIN EC 81 MG PO TBEC
81.0000 mg | DELAYED_RELEASE_TABLET | Freq: Every day | ORAL | Status: DC
  Administered 2021-08-30 – 2021-08-31 (×2): 81 mg via ORAL
  Filled 2021-08-30 (×2): qty 1

## 2021-08-30 MED ORDER — SODIUM CHLORIDE 0.9 % IV SOLN: 1.0000 mg/h | INTRAVENOUS | Status: DC

## 2021-08-30 MED ORDER — HYDROMORPHONE HCL 1 MG/ML IJ SOLN
INTRAMUSCULAR | Status: AC
  Administered 2021-08-30: 1 mg via INTRAVENOUS
  Filled 2021-08-30: qty 1

## 2021-08-30 MED ORDER — POTASSIUM CHLORIDE CRYS ER 20 MEQ PO TBCR: 20.0000 meq | EXTENDED_RELEASE_TABLET | Freq: Once | ORAL | Status: DC

## 2021-08-30 MED ORDER — METOPROLOL TARTRATE 5 MG/5ML IV SOLN: 2.0000 mg | INTRAVENOUS | Status: DC | PRN

## 2021-08-30 MED ORDER — HYDROMORPHONE HCL 1 MG/ML IJ SOLN: 1.0000 mg | Freq: Once | INTRAMUSCULAR | Status: AC | PRN

## 2021-08-30 MED ORDER — GUAIFENESIN-DM 100-10 MG/5ML PO SYRP
15.0000 mL | ORAL_SOLUTION | ORAL | Status: DC | PRN
  Filled 2021-08-30: qty 15

## 2021-08-30 MED ORDER — CEFAZOLIN SODIUM-DEXTROSE 2-4 GM/100ML-% IV SOLN: 2.0000 g | Freq: Once | INTRAVENOUS | Status: DC

## 2021-08-30 MED ORDER — IODIXANOL 320 MG/ML IV SOLN
INTRAVENOUS | Status: DC | PRN
  Administered 2021-08-30: 55 mL via INTRA_ARTERIAL

## 2021-08-30 MED ORDER — ALBUTEROL SULFATE HFA 108 (90 BASE) MCG/ACT IN AERS
2.0000 | INHALATION_SPRAY | Freq: Four times a day (QID) | RESPIRATORY_TRACT | Status: DC | PRN
  Filled 2021-08-30: qty 6.7

## 2021-08-30 MED ORDER — ALTEPLASE 1 MG/ML SYRINGE FOR VASCULAR PROCEDURE
INTRAMUSCULAR | Status: DC | PRN
  Administered 2021-08-30: 8 mg via INTRA_ARTERIAL

## 2021-08-30 MED ORDER — MIDAZOLAM HCL 2 MG/2ML IJ SOLN
INTRAMUSCULAR | Status: DC | PRN
  Administered 2021-08-30: 2 mg via INTRAVENOUS
  Administered 2021-08-30 (×3): 1 mg via INTRAVENOUS

## 2021-08-30 MED ORDER — SODIUM CHLORIDE 0.9 % IV SOLN
0.5000 mg/h | INTRAVENOUS | Status: DC
  Administered 2021-08-30 – 2021-08-31 (×2): 0.5 mg/h
  Filled 2021-08-30 (×2): qty 10

## 2021-08-30 MED ORDER — DIPHENHYDRAMINE HCL 25 MG PO CAPS
25.0000 mg | ORAL_CAPSULE | Freq: Every day | ORAL | Status: DC | PRN
  Filled 2021-08-30: qty 1

## 2021-08-30 MED ORDER — HEPARIN (PORCINE) 25000 UT/250ML-% IV SOLN: 800.0000 [IU]/h | INTRAVENOUS | Status: DC

## 2021-08-30 MED ORDER — ONDANSETRON HCL 4 MG/2ML IJ SOLN: 4.0000 mg | Freq: Four times a day (QID) | INTRAMUSCULAR | Status: DC | PRN

## 2021-08-30 MED ORDER — OXYCODONE-ACETAMINOPHEN 5-325 MG PO TABS
1.0000 | ORAL_TABLET | Freq: Four times a day (QID) | ORAL | Status: DC | PRN
  Administered 2021-08-30 – 2021-09-01 (×5): 1 via ORAL
  Filled 2021-08-30 (×5): qty 1

## 2021-08-30 MED ORDER — HEPARIN SODIUM (PORCINE) 1000 UNIT/ML IJ SOLN
INTRAMUSCULAR | Status: AC
  Filled 2021-08-30: qty 10

## 2021-08-30 MED ORDER — LOSARTAN POTASSIUM 50 MG PO TABS
50.0000 mg | ORAL_TABLET | Freq: Every day | ORAL | Status: DC
  Administered 2021-08-30 – 2021-08-31 (×2): 50 mg via ORAL
  Filled 2021-08-30 (×2): qty 1

## 2021-08-30 MED ORDER — ADULT MULTIVITAMIN W/MINERALS CH
1.0000 | ORAL_TABLET | Freq: Every day | ORAL | Status: DC
  Administered 2021-08-30 – 2021-08-31 (×2): 1 via ORAL
  Filled 2021-08-30 (×2): qty 1

## 2021-08-30 MED ORDER — MOMETASONE FURO-FORMOTEROL FUM 200-5 MCG/ACT IN AERO
2.0000 | INHALATION_SPRAY | Freq: Two times a day (BID) | RESPIRATORY_TRACT | Status: DC
  Administered 2021-08-31 – 2021-09-01 (×2): 2 via RESPIRATORY_TRACT
  Filled 2021-08-30: qty 8.8

## 2021-08-30 MED ORDER — MIDAZOLAM HCL 5 MG/5ML IJ SOLN
INTRAMUSCULAR | Status: AC
  Filled 2021-08-30: qty 5

## 2021-08-30 MED ORDER — HYDRALAZINE HCL 20 MG/ML IJ SOLN: 5.0000 mg | INTRAMUSCULAR | Status: DC | PRN

## 2021-08-30 MED ORDER — MORPHINE SULFATE (PF) 2 MG/ML IV SOLN
INTRAVENOUS | Status: AC
  Filled 2021-08-30: qty 1

## 2021-08-30 MED ORDER — HEPARIN SODIUM (PORCINE) 1000 UNIT/ML IJ SOLN
INTRAMUSCULAR | Status: DC | PRN
  Administered 2021-08-30: 5000 [IU] via INTRAVENOUS

## 2021-08-30 MED ORDER — CEFAZOLIN SODIUM-DEXTROSE 1-4 GM/50ML-% IV SOLN
INTRAVENOUS | Status: DC | PRN
  Administered 2021-08-30: 2 g via INTRAVENOUS

## 2021-08-30 MED ORDER — FAMOTIDINE 20 MG PO TABS: 40.0000 mg | ORAL_TABLET | Freq: Once | ORAL | Status: DC | PRN

## 2021-08-30 MED ORDER — MORPHINE SULFATE (PF) 2 MG/ML IV SOLN
INTRAVENOUS | Status: DC | PRN
  Administered 2021-08-30 (×2): 1 mg via INTRAVENOUS

## 2021-08-30 MED ORDER — ALUM & MAG HYDROXIDE-SIMETH 200-200-20 MG/5ML PO SUSP: 15.0000 mL | ORAL | Status: DC | PRN

## 2021-08-30 MED ORDER — METHYLPREDNISOLONE SODIUM SUCC 125 MG IJ SOLR
125.0000 mg | Freq: Once | INTRAMUSCULAR | Status: DC | PRN
Start: 2021-08-30 — End: 2021-08-30

## 2021-08-30 MED ORDER — SODIUM CHLORIDE 0.9 % IV SOLN: INTRAVENOUS | Status: DC

## 2021-08-30 MED ORDER — CEFAZOLIN SODIUM-DEXTROSE 2-4 GM/100ML-% IV SOLN
INTRAVENOUS | Status: AC
  Filled 2021-08-30: qty 100

## 2021-08-30 MED ORDER — HEPARIN (PORCINE) 25000 UT/250ML-% IV SOLN
INTRAVENOUS | Status: AC
  Administered 2021-08-30: 800 [IU]/h via INTRAVENOUS
  Filled 2021-08-30: qty 250

## 2021-08-30 MED ORDER — SODIUM CHLORIDE 0.9 % IV SOLN
1.0000 mg/h | INTRAVENOUS | Status: AC
  Administered 2021-08-30: 1 mg/h
  Filled 2021-08-30: qty 10

## 2021-08-30 MED ORDER — PANTOPRAZOLE SODIUM 40 MG PO TBEC: 40.0000 mg | DELAYED_RELEASE_TABLET | Freq: Every day | ORAL | Status: DC

## 2021-08-30 MED ORDER — LABETALOL HCL 5 MG/ML IV SOLN: 10.0000 mg | INTRAVENOUS | Status: DC | PRN

## 2021-08-30 MED ORDER — DIPHENHYDRAMINE HCL 50 MG/ML IJ SOLN: 50.0000 mg | Freq: Once | INTRAMUSCULAR | Status: DC | PRN

## 2021-08-30 MED ORDER — HYDROMORPHONE HCL 1 MG/ML IJ SOLN
1.0000 mg | INTRAMUSCULAR | Status: DC | PRN
  Administered 2021-08-30 – 2021-08-31 (×4): 1 mg via INTRAVENOUS
  Filled 2021-08-30 (×4): qty 1

## 2021-08-30 SURGICAL SUPPLY — 19 items
BALLN LUTONIX 018 5X300X130 (BALLOONS) ×2
BALLN ULTRVRSE 3X220X150 (BALLOONS) ×2
BALLOON LUTONIX 018 5X300X130 (BALLOONS) IMPLANT
BALLOON ULTRVRSE 3X220X150 (BALLOONS) IMPLANT
CATH ANGIO 5F PIGTAIL 65CM (CATHETERS) ×1 IMPLANT
CATH INFUS 135CMX50CM (CATHETERS) ×1 IMPLANT
CATH ROTAREX 135 6FR (CATHETERS) ×1 IMPLANT
CATH VERT 5FR 125CM (CATHETERS) ×1 IMPLANT
COVER PROBE U/S 5X48 (MISCELLANEOUS) ×1 IMPLANT
GLIDEWIRE ADV .035X260CM (WIRE) ×1 IMPLANT
KIT ENCORE 26 ADVANTAGE (KITS) ×1 IMPLANT
PACK ANGIOGRAPHY (CUSTOM PROCEDURE TRAY) ×2 IMPLANT
SHEATH BRITE TIP 5FRX11 (SHEATH) ×1 IMPLANT
SHEATH DESTIN RDC 6FR 45 (SHEATH) ×1 IMPLANT
SUT PROLENE 0 CT 1 30 (SUTURE) ×1 IMPLANT
SYR MEDRAD MARK 7 150ML (SYRINGE) ×1 IMPLANT
TUBING CONTRAST HIGH PRESS 72 (TUBING) ×1 IMPLANT
WIRE G V18X300CM (WIRE) ×1 IMPLANT
WIRE GUIDERIGHT .035X150 (WIRE) ×1 IMPLANT

## 2021-08-30 NOTE — Interval H&P Note (Signed)
History and Physical Interval Note:  08/30/2021 11:14 AM  Nathaniel [name]  has presented today for surgery, with the diagnosis of LLE Angio  BARD  ASO w rest pain.  The various methods of treatment have been discussed with the patient and family. After consideration of risks, benefits and other options for treatment, the patient has consented to  Procedure(s): Lower Extremity Angiography (Left) as a surgical intervention.  The patient's history has been reviewed, patient examined, no change in status, stable for surgery.  I have reviewed the patient's chart and labs.  Questions were answered to the patient's satisfaction.     Leotis Pain

## 2021-08-30 NOTE — Op Note (Signed)
[facility]  Percutaneous Study/Intervention Procedural Note   Date of Surgery: 08/30/2021  Surgeon(s):Nalin Mazzocco    Assistants:none  Pre-operative Diagnosis: PAD with rest pain, acute on chronic ischemia left lower extremity  Post-operative diagnosis:  Same  Procedure(s) Performed:             1.  Ultrasound guidance for vascular access right femoral artery             2.  Catheter placement into left common femoral artery from right femoral approach             3.  Aortogram and selective left lower extremity angiogram             4.  Catheter directed thrombolytic therapy with 8 mg of tPA to the left SFA and popliteal artery             5.  Mechanical thrombectomy to the left SFA and popliteal arteries with the [place] Rex device  6.  Percutaneous transluminal angioplasty of the left posterior tibial artery and tibioperoneal trunk with 3 mm diameter angioplasty balloon             7.  Percutaneous transluminal angioplasty of left SFA and popliteal arteries with 2 inflations with a 5 mm diameter by 30 cm length Lutonix drug-coated angioplasty balloon  8.  Placement of a thrombolytic catheter for continuous thrombolytic therapy in the left SFA, popliteal arteries, tibioperoneal trunk, and proximal posterior tibial artery  EBL: 100 cc  Contrast: 55 cc  Fluoro Time: 7 minutes  Moderate Conscious Sedation Time: approximately 47 minutes using 5 mg of Versed and 2 mg of Morphine              Indications:  Patient is a 71 y.o.male with recurrent rest pain in the left lower extremity for about 1 week. The patient has noninvasive study showing markedly reduced perfusion on the left leg. The patient is brought in for angiography for further evaluation and potential treatment.  Due to the limb threatening nature of the situation, angiogram was performed for attempted limb salvage. The patient is aware that if the procedure fails, amputation would be expected.  The patient  also understands that even with successful revascularization, amputation may still be required due to the severity of the situation.  Risks and benefits are discussed and informed consent is obtained.   Procedure:  The patient was identified and appropriate procedural time out was performed.  The patient was then placed supine on the table and prepped and draped in the usual sterile fashion. Moderate conscious sedation was administered during a face to face encounter with the patient throughout the procedure with my supervision of the RN administering medicines and monitoring the patient's vital signs, pulse oximetry, telemetry and mental status throughout from the start of the procedure until the patient was taken to the recovery room. Ultrasound was used to evaluate the right common femoral artery.  It was patent .  A digital ultrasound image was acquired.  A Seldinger needle was used to access the right common femoral artery under direct ultrasound guidance and a permanent image was performed.  A 0.035 J wire was advanced without resistance and a 5Fr sheath was placed.  Pigtail catheter was placed into the aorta and an AP aortogram was performed. This demonstrated normal renal arteries and normal aorta and iliac segments without significant stenosis. I then crossed the aortic bifurcation and advanced to the left femoral head. Selective left lower  extremity angiogram was then performed. This demonstrated that the left common femoral artery, profunda femoris artery were fairly normal.  The SFA occluded just above the previously placed stents and the stents were occluded.  There was occlusion down to the tibioperoneal trunk and proximal posterior tibial arteries with reconstitution of the mid posterior tibial artery as the only runoff distally.. It was felt that it was in the patient's best interest to proceed with intervention after these images to avoid a second procedure and a larger amount of contrast and  fluoroscopy based off of the findings from the initial angiogram. The patient was systemically heparinized and a 6 [place] destination sheath was then placed over the Terumo Advantage wire. I then used a Kumpe catheter and the advantage wire to easily navigate into and through the occluded stents in the SFA and popliteal artery as well as across the occlusion in the tibioperoneal trunk and proximal posterior tibial arteries and then placing a V18 wire.  8 mg of tPA were instilled in the left SFA and popliteal arteries and allowed to dwell.  I then selected the [place] Rex device and perform mechanical thrombectomy in the left SFA and popliteal arteries with several passes of the device to debulk the large amount of thrombus.  Following this, there still remained minimal flow distally.  I then performed angioplasty first of the posterior tibial artery and tibioperoneal trunk with a 3 mm diameter by 22 cm length angioplasty balloon inflated to 10 atm for 1 minute.  I then used 2 inflations with a 5 mm diameter by 30 cm length angioplasty balloon which was a Lutonix drug-coated angioplasty balloon to treat the entire left SFA and popliteal arteries.  These inflations were 6 to 8 atm for 1 minute.  Following this, there still remained a large amount of thrombus with poor forward flow and I felt the only hope of restoring patency and limb salvage from an endovascular standpoint would be with continuous thrombolytic therapy.  At this point, 130 cm total length 50 cm working length catheter was parked with the distal extent in the left posterior tibial artery and the proximal extent in the most proximal SFA.  This was secured into place with a Prolene suture as was the sheath.   I elected to terminate the procedure. The patient was taken to the recovery room in stable condition having tolerated the procedure well.  Findings:               Aortogram: The aorta and iliac arteries were fairly normal and the renal arteries  appeared to have good flow without hemodynamically significant stenosis.             Left Lower Extremity: The left common femoral artery, profunda femoris artery were fairly normal.  The SFA occluded just above the previously placed stents and the stents were occluded.  There was occlusion down to the tibioperoneal trunk and proximal posterior tibial arteries with reconstitution of the mid posterior tibial artery as the only runoff distally.   Disposition: Patient was taken to the recovery room in stable condition having tolerated the procedure well.  Complications: None  Leotis Pain 08/30/2021 1:28 PM   This note was created with Dragon Medical transcription system. Any errors in dictation are purely unintentional.

## 2021-08-31 ENCOUNTER — Encounter
Admission: RE | Disposition: A | Discharge status: Home / Self Care | Payer: Self-pay | Source: Home / Self Care | Attending: Vascular Surgery

## 2021-08-31 ENCOUNTER — Encounter: Payer: Self-pay | Admitting: Vascular Surgery

## 2021-08-31 DIAGNOSIS — I70222 Atherosclerosis of native arteries of extremities with rest pain, left leg: Secondary | ICD-10-CM

## 2021-08-31 HISTORY — PX: LOWER EXTREMITY INTERVENTION: CATH118252

## 2021-08-31 LAB — CBC
HCT: 38.3 % — ABNORMAL LOW (ref 39.0–52.0)
HCT: 37.1 % — ABNORMAL LOW (ref 39.0–52.0)
HCT: 36.6 % — ABNORMAL LOW (ref 39.0–52.0)
Hemoglobin: 12.7 g/dL — ABNORMAL LOW (ref 13.0–17.0)
Hemoglobin: 12.4 g/dL — ABNORMAL LOW (ref 13.0–17.0)
Hemoglobin: 12.2 g/dL — ABNORMAL LOW (ref 13.0–17.0)
MCH: 31.4 pg (ref 26.0–34.0)
MCH: 31.4 pg (ref 26.0–34.0)
MCH: 31.5 pg (ref 26.0–34.0)
MCHC: 33.2 g/dL (ref 30.0–36.0)
MCHC: 33.4 g/dL (ref 30.0–36.0)
MCHC: 33.3 g/dL (ref 30.0–36.0)
MCV: 94.8 fL (ref 80.0–100.0)
MCV: 93.9 fL (ref 80.0–100.0)
MCV: 94.6 fL (ref 80.0–100.0)
Platelets: 197 10*3/uL (ref 150–400)
Platelets: 218 10*3/uL (ref 150–400)
Platelets: 213 10*3/uL (ref 150–400)
RBC: 4.04 MIL/uL — ABNORMAL LOW (ref 4.22–5.81)
RBC: 3.95 MIL/uL — ABNORMAL LOW (ref 4.22–5.81)
RBC: 3.87 MIL/uL — ABNORMAL LOW (ref 4.22–5.81)
RDW: 12.8 % (ref 11.5–15.5)
RDW: 12.7 % (ref 11.5–15.5)
RDW: 12.9 % (ref 11.5–15.5)
WBC: 13.3 10*3/uL — ABNORMAL HIGH (ref 4.0–10.5)
WBC: 13.3 10*3/uL — ABNORMAL HIGH (ref 4.0–10.5)
WBC: 11.1 10*3/uL — ABNORMAL HIGH (ref 4.0–10.5)
nRBC: 0 % (ref 0.0–0.2)
nRBC: 0 % (ref 0.0–0.2)
nRBC: 0 % (ref 0.0–0.2)

## 2021-08-31 LAB — FIBRINOGEN
Fibrinogen: 329 mg/dL (ref 210–475)
Fibrinogen: 288 mg/dL (ref 210–475)
Fibrinogen: 293 mg/dL (ref 210–475)
Fibrinogen: 295 mg/dL (ref 210–475)

## 2021-08-31 LAB — GLUCOSE, CAPILLARY: Glucose-Capillary: 124 mg/dL — ABNORMAL HIGH (ref 70–99)

## 2021-08-31 LAB — CREATININE, SERUM
Creatinine, Ser: 1.17 mg/dL (ref 0.61–1.24)
GFR, Estimated: >60 mL/min (ref >60)

## 2021-08-31 LAB — BUN: BUN: 20 mg/dL (ref 8–23)

## 2021-08-31 SURGERY — LOWER EXTREMITY INTERVENTION
Anesthesia: Moderate Sedation | Laterality: Left

## 2021-08-31 MED ORDER — SODIUM CHLORIDE 0.9 % IV SOLN: INTRAVENOUS | Status: DC

## 2021-08-31 MED ORDER — MIDAZOLAM HCL 2 MG/2ML IJ SOLN
INTRAMUSCULAR | Status: DC | PRN
  Administered 2021-08-31: 2 mg via INTRAVENOUS

## 2021-08-31 MED ORDER — FAMOTIDINE 20 MG PO TABS: 40.0000 mg | ORAL_TABLET | Freq: Once | ORAL | Status: DC | PRN

## 2021-08-31 MED ORDER — TIROFIBAN HCL IN NACL 5-0.9 MG/100ML-% IV SOLN
0.1500 ug/kg/min | INTRAVENOUS | Status: AC
  Administered 2021-08-31 (×2): 0.15 ug/kg/min via INTRAVENOUS
  Filled 2021-08-31 (×3): qty 100

## 2021-08-31 MED ORDER — HEPARIN SODIUM (PORCINE) 1000 UNIT/ML IJ SOLN
INTRAMUSCULAR | Status: AC
  Filled 2021-08-31: qty 10

## 2021-08-31 MED ORDER — DIPHENHYDRAMINE HCL 50 MG/ML IJ SOLN: 50.0000 mg | Freq: Once | INTRAMUSCULAR | Status: DC | PRN

## 2021-08-31 MED ORDER — TIROFIBAN (AGGRASTAT) BOLUS VIA INFUSION
25.0000 ug/kg | Freq: Once | INTRAVENOUS | Status: AC
  Filled 2021-08-31: qty 47

## 2021-08-31 MED ORDER — CEFAZOLIN SODIUM-DEXTROSE 2-4 GM/100ML-% IV SOLN
INTRAVENOUS | Status: AC
  Administered 2021-08-31: 2 g via INTRAVENOUS
  Filled 2021-08-31: qty 100

## 2021-08-31 MED ORDER — CHLORHEXIDINE GLUCONATE CLOTH 2 % EX PADS
6.0000 | MEDICATED_PAD | Freq: Every day | CUTANEOUS | Status: DC
  Administered 2021-08-31: 6 via TOPICAL

## 2021-08-31 MED ORDER — MORPHINE SULFATE (PF) 2 MG/ML IV SOLN
INTRAVENOUS | Status: AC
  Administered 2021-08-31: 2 mg via INTRA_ARTERIAL
  Filled 2021-08-31: qty 1

## 2021-08-31 MED ORDER — MIDAZOLAM HCL 2 MG/ML PO SYRP: 8.0000 mg | ORAL_SOLUTION | Freq: Once | ORAL | Status: DC | PRN

## 2021-08-31 MED ORDER — TIROFIBAN HCL IV 12.5 MG/250 ML
INTRAVENOUS | Status: AC
  Administered 2021-08-31: 2347.5 ug via INTRAVENOUS
  Filled 2021-08-31: qty 250

## 2021-08-31 MED ORDER — HEPARIN SODIUM (PORCINE) 1000 UNIT/ML IJ SOLN
INTRAMUSCULAR | Status: DC | PRN
  Administered 2021-08-31: 4000 [IU] via INTRAVENOUS

## 2021-08-31 MED ORDER — MORPHINE SULFATE (PF) 4 MG/ML IV SOLN
INTRAVENOUS | Status: DC | PRN
  Administered 2021-08-31: 1 mg via INTRAVENOUS

## 2021-08-31 MED ORDER — ONDANSETRON HCL 4 MG/2ML IJ SOLN: 4.0000 mg | Freq: Four times a day (QID) | INTRAMUSCULAR | Status: DC | PRN

## 2021-08-31 MED ORDER — METHYLPREDNISOLONE SODIUM SUCC 125 MG IJ SOLR: 125.0000 mg | Freq: Once | INTRAMUSCULAR | Status: DC | PRN

## 2021-08-31 MED ORDER — HYDROMORPHONE HCL 1 MG/ML IJ SOLN: 1.0000 mg | Freq: Once | INTRAMUSCULAR | Status: DC | PRN

## 2021-08-31 MED ORDER — MIDAZOLAM HCL 2 MG/2ML IJ SOLN
INTRAMUSCULAR | Status: AC
  Filled 2021-08-31: qty 2

## 2021-08-31 MED ORDER — CEFAZOLIN SODIUM-DEXTROSE 2-4 GM/100ML-% IV SOLN: 2.0000 g | Freq: Once | INTRAVENOUS | Status: AC

## 2021-08-31 SURGICAL SUPPLY — 13 items
BALLN LUTONIX 018 5X100X130 (BALLOONS) ×2
BALLN ULTRVRSE 3X150X150 (BALLOONS) ×2
BALLOON LUTONIX 018 5X100X130 (BALLOONS) IMPLANT
BALLOON ULTRVRSE 3X150X150 (BALLOONS) IMPLANT
CANISTER PENUMBRA ENGINE (MISCELLANEOUS) ×1 IMPLANT
CATH INDIGO CAT6 KIT (CATHETERS) ×1 IMPLANT
DEVICE SAFEGUARD 24CM (GAUZE/BANDAGES/DRESSINGS) ×1 IMPLANT
DEVICE STARCLOSE SE CLOSURE (Vascular Products) ×1 IMPLANT
KIT ENCORE 26 ADVANTAGE (KITS) ×1 IMPLANT
PACK ANGIOGRAPHY (CUSTOM PROCEDURE TRAY) ×1 IMPLANT
STENT VIABAHN 6X7.5X120 (Permanent Stent) ×1 IMPLANT
WIRE G V18X300CM (WIRE) ×1 IMPLANT
WIRE GUIDERIGHT .035X150 (WIRE) ×1 IMPLANT

## 2021-08-31 NOTE — Op Note (Signed)
[facility]  Percutaneous Study/Intervention Procedural Note   Date of Surgery: 08/31/2021  Surgeon(s):Idella Lamontagne    Assistants:none  Pre-operative Diagnosis: PAD with rest [name], s/p overnight thrombolytic therapy.  Post-operative diagnosis:  Same  Procedure(s) Performed:             1.  LLE angiogram             2.  Stent placement to the proximal left SFA with 6 mm diameter by 7.5 cm length Viabahn stent             3.  Mechanical thrombectomy using the penumbra CAT 6 device to the left SFA for thrombus within the stent             4.  Percutaneous transluminal angioplasty of left posterior tibial artery and tibioperoneal trunk with 3 mm diameter by 15 cm length angioplasty balloon             5.  StarClose closure device right femoral artery  EBL: 125 cc  Contrast: 40 cc  Fluoro Time: 3.4 minutes  Moderate Conscious Sedation Time: approximately 25 minutes using 2 mg of Versed and 1 mg of morphine               Indications:  Patient is a 71 y.o.male with ischemic left leg who has been undergoing overnight thrombolytic therapy. The patient is brought in for angiography for further evaluation and potential treatment.  Due to the limb threatening nature of the situation, angiogram was performed for attempted limb salvage. The patient is aware that if the procedure fails, amputation would be expected.  The patient also understands that even with successful revascularization, amputation may still be required due to the severity of the situation. Risks and benefits are discussed and informed consent is obtained.   Procedure:  The patient was identified and appropriate procedural time out was performed.  The patient was then placed supine on the table and prepped and draped in the usual sterile fashion. Moderate conscious sedation was administered during a face to face encounter with the patient throughout the procedure with my supervision of the RN administering  medicines and monitoring the patient's vital signs, pulse oximetry, telemetry and mental status throughout from the start of the procedure until the patient was taken to the recovery room.  The existing thrombolytic catheter was removed over a V18 wire that was parked in the distal left posterior tibial artery with the sheath remaining in place.  Selective left lower extremity angiogram was then performed. There was residual thrombus and stenosis at the leading edge of the previously placed stents in the proximal left SFA.  The common femoral artery and profunda femoris artery were patent.  There was a blob of thrombus within the midportion of the stents.  There was stenosis and possible thrombus in the tibioperoneal trunk and proximal left posterior tibial artery creating greater than 70% stenosis and this was the dominant runoff distally. It was felt that it was in the patient's best interest to proceed with intervention after these images to avoid a second procedure and a larger amount of contrast and fluoroscopy based off of the findings from the initial angiogram. The patient was systemically heparinized and I started by placing a Viabahn covered stent in the proximal left SFA just above the previous stents in the area of stenosis and thrombus.  This was a 6 mm diameter by 7.5 cm length Viabahn stent postdilated with a 5 mm diameter Lutonix  drug-coated balloon with excellent angiographic completion result and less than 10% residual stenosis.  I then performed mechanical thrombectomy on the left SFA for the in-stent thrombus that was seen.  This was a relatively small area after overnight thrombolytic therapy and it cleaned up nicely with 1 pass with the penumbra CAT 6 device in the left SFA.  No significant residual thrombus was present in the SFA or popliteal arteries after this.  I then used a 3 mm diameter by 15 cm length angioplasty balloon in the tibioperoneal trunk and proximal posterior tibial artery  inflating this to 10 atm for 1 minute.  Completion imaging following this showed less than 20% residual stenosis although there was some spasm just distal to this area after angioplasty.  We now also had some filling of the anterior tibial artery providing some additional flow distally.  I elected to terminate the procedure. The sheath was removed and StarClose closure device was deployed in the right femoral artery with excellent hemostatic result. The patient was taken to the recovery room in stable condition having tolerated the procedure well.  Findings:                            Left Lower Extremity: There was residual thrombus and stenosis at the leading edge of the previously placed stents in the proximal left SFA.  The common femoral artery and profunda femoris artery were patent.  There was a blob of thrombus within the midportion of the stents.  There was stenosis and possible thrombus in the tibioperoneal trunk and proximal left posterior tibial artery creating greater than 70% stenosis and this was the dominant runoff distally.   Disposition: Patient was taken to the recovery room in stable condition having tolerated the procedure well.  Complications: None  Nathaniel [name] 08/31/2021 8:49 AM   This note was created with Dragon Medical transcription system. Any errors in dictation are purely unintentional.

## 2021-08-31 NOTE — Plan of Care (Signed)

## 2021-08-31 NOTE — TOC Initial Note (Signed)
Transition of Care [facility]) - Initial/Assessment Note    Patient Details  Name: Nathaniel [name] MRN: [id number] Date of Birth: 03-29-1951  Transition of Care [facility]) CM/SW Contact:    Shelbie Hutching, RN Phone Number: 08/31/2021, 10:03 AM  Clinical Narrative:                  Transition of Care [facility]) Screening Note   Patient Details  Name: Nathaniel [name] Date of Birth: 16-Apr-1951   Transition of Care [facility]) CM/SW Contact:    Shelbie Hutching, RN Phone Number: 08/31/2021, 10:03 AM    Transition of Care Department (TOC) has reviewed patient and no TOC needs have been identified at this time. We will continue to monitor patient advancement through interdisciplinary progression rounds. If new patient transition needs arise, please place a TOC consult.          Patient Goals and CMS Choice        Expected Discharge Plan and Services                                                Prior Living Arrangements/Services                       Activities of Daily Living Home Assistive Devices/Equipment: None ADL Screening (condition at time of admission) Patient's cognitive ability adequate to safely complete daily activities?: Yes Is the patient deaf or have difficulty hearing?: No Does the patient have difficulty seeing, even when wearing glasses/contacts?: No Does the patient have difficulty concentrating, remembering, or making decisions?: No Patient able to express need for assistance with ADLs?: No Does the patient have difficulty dressing or bathing?: No Independently performs ADLs?: Yes (appropriate for developmental age) Does the patient have difficulty walking or climbing stairs?: No Weakness of Legs: None Weakness of Arms/Hands: None  Permission Sought/Granted                  Emotional Assessment              Admission diagnosis:  Ischemic leg [I99.8] Patient Active Problem List   Diagnosis Date Noted   Ischemic leg  04/11/2020   Stenosis of carotid artery 03/09/2020   Centrilobular emphysema ([place]) 03/09/2020   Screening for colon cancer 03/09/2020   Ischemia of left lower extremity 11/16/2019   Somnolence 11/14/2017   Chest discomfort 11/14/2017   Medicare annual wellness visit, subsequent 02/05/2017   Atherosclerosis of native arteries of extremity with intermittent claudication ([place]) 01/22/2017   Fatigue 11/23/2016   Prediabetes 11/23/2016   Inguinal hernia 11/23/2016   Medicare annual wellness visit, initial 01/16/2016   Hyperlipidemia 01/16/2016   Welcome to Medicare preventive visit 01/17/2015   Tremor of both hands 12/14/2014   Skin abnormalities 11/16/2014   Tobacco abuse 11/16/2014   GERD (gastroesophageal reflux disease) 11/16/2014   PCP:  Pleas Koch, NP Pharmacy:   [id number] - [place], [place] - [address] [place] [address] Phone: [phone] Fax: [phone]     Social Determinants of Health (SDOH) Interventions    Readmission Risk Interventions No flowsheet data found.

## 2021-09-01 LAB — FIBRINOGEN
Fibrinogen: 334 mg/dL (ref 210–475)
Fibrinogen: 357 mg/dL (ref 210–475)

## 2021-09-01 NOTE — Discharge Summary (Signed)
[name]    Discharge Summary    Patient ID:  Nathaniel [name] MRN: [id number] DOB/AGE: 1951/01/11 71 y.o.  Admit date: 08/30/2021 Discharge date: 09/01/2021 Date of Surgery: 08/31/2021 Surgeon: Surgeon(s): Lucky Cowboy Erskine Squibb, MD  Admission Diagnosis: Ischemic leg [I99.8]  Discharge Diagnoses:  Ischemic leg [I99.8]  Secondary Diagnoses: Past Medical History:  Diagnosis Date   Cataract    Chicken pox    GERD (gastroesophageal reflux disease)    Hyperlipidemia    PAD (peripheral artery disease) ([place])    Pain in both lower extremities 11/23/2016   Peripheral vascular disease ([place])    Pre-diabetes    Seizures ([place])    None since age 72    Procedure(s): LOWER EXTREMITY INTERVENTION  Discharged Condition: good  HPI:  Patient presented with an ischemic leg.  Underwent extensive revascularization.   Hospital Course:  Nathaniel [name] is a 71 y.o. male is S/P Left Procedure(s): LOWER EXTREMITY INTERVENTION Extubated: POD #  NA Physical exam: feet warm, palpable pulses distally Post-op wounds clean, dry, intact or healing well Pt. Ambulating, voiding and taking PO diet without difficulty. Pt pain controlled with PO pain meds. Labs as below Complications:none  Consults:    Significant Diagnostic Studies: CBC Lab Results  Component Value Date   WBC 11.1 (H) 08/31/2021   HGB 12.2 (L) 08/31/2021   HCT 36.6 (L) 08/31/2021   MCV 94.6 08/31/2021   PLT 213 08/31/2021    BMET    Component Value Date/Time   NA 144 05/04/2021 1038   K 4.3 05/04/2021 1038   CL 105 05/04/2021 1038   CO2 22 05/04/2021 1038   GLUCOSE 93 05/04/2021 1038   GLUCOSE 97 04/19/2021 0846   BUN 20 08/31/2021 1036   BUN 10 05/04/2021 1038   CREATININE 1.17 08/31/2021 1036   CALCIUM 9.2 05/04/2021 1038   GFRNONAA >60 08/31/2021 1036   GFRAA >60 04/13/2020 0723   COAG No results found for: INR, PROTIME   Disposition:  Discharge to :Home Discharge  Instructions     Call MD for:  redness, tenderness, or signs of infection (pain, swelling, bleeding, redness, odor or green/yellow discharge around incision site)   Complete by: As directed    Call MD for:  severe or increased pain, loss or decreased feeling  in affected limb(s)   Complete by: As directed    Call MD for:  temperature >100.5   Complete by: As directed    Driving Restrictions   Complete by: As directed    No driving for 24 hours   Lifting restrictions   Complete by: As directed    No lifting for 24 hours   No dressing needed   Complete by: As directed    Replace only if drainage present   Resume previous diet   Complete by: As directed       Allergies as of 09/01/2021       Reactions   Fentanyl Other (See Comments)   chills   Prozac [fluoxetine Hcl] Other (See Comments)   Extreme depression   Statins Other (See Comments)   Flu like symptoms        Medication List     TAKE these medications    albuterol 108 (90 Base) MCG/ACT inhaler Commonly known as: VENTOLIN HFA Inhale 2 puffs into the lungs every 6 (six) hours as needed for shortness of breath.   Aspirin Low Dose 81 MG EC tablet Generic drug: aspirin TAKE 1 TABLET BY MOUTH  EVERY DAY   diphenhydrAMINE 25 MG tablet Commonly known as: BENADRYL Take 25 mg by mouth daily as needed for itching.   Eliquis 5 MG Tabs tablet Generic drug: apixaban TAKE 1 TABLET BY MOUTH TWICE A DAY   esomeprazole 20 MG capsule Commonly known as: NEXIUM Take 20 mg by mouth daily as needed (heartburn).   ibuprofen 200 MG tablet Commonly known as: ADVIL Take 200-400 mg by mouth every 6 (six) hours as needed for fever or mild pain.   losartan 50 MG tablet Commonly known as: COZAAR Take 1 tablet (50 mg total) by mouth daily.   multivitamin with minerals tablet Take 1 tablet by mouth daily.   Symbicort 160-4.5 MCG/ACT inhaler Generic drug: budesonide-formoterol TAKE 2 PUFFS BY MOUTH TWICE A DAY        Verbal and written Discharge instructions given to the patient. Wound care per Discharge AVS  Follow-up Information     Siedah Sedor, Erskine Squibb, MD Follow up in 4 week(s).   Specialties: Vascular Surgery, Radiology, Interventional Cardiology Why: with ABIs Contact information: [place] [id number] [phone]               Patient is allergic to statins so he is not on a statin at discharge for that reason.    Signed: Leotis Pain, MD  09/01/2021, 9:01 AM

## 2021-09-01 NOTE — Plan of Care (Signed)

## 2021-09-19 NOTE — Progress Notes (Signed)
Subjective:   Nathaniel [name] is a 71 y.o. male who presents for Medicare Annual/Subsequent preventive examination.  I connected with Herbie Drape today by telephone and verified that I am speaking with the correct person using two identifiers. Location patient: home Location provider: work Persons participating in the virtual visit: patient, wife, Marine scientist.    I discussed the limitations, risks, security and privacy concerns of performing an evaluation and management service by telephone and the availability of in person appointments. I also discussed with the patient that there may be a patient responsible charge related to this service. The patient expressed understanding and verbally consented to this telephonic visit.    Interactive audio and video telecommunications were attempted between this provider and patient, however failed, due to patient having technical difficulties OR patient did not have access to video capability.  We continued and completed visit with audio only.  Some vital signs may be absent or patient reported.   Time Spent with patient on telephone encounter: 25 minutes  Review of Systems     Cardiac Risk Factors include: advanced age (>5men, >45 women);dyslipidemia     Objective:    Today's Vitals   09/20/21 1159  Weight: 207 lb (93.9 kg)  Height: 6\' 4"  (1.93 m)   Body mass index is 25.2 kg/m.  Advanced Directives 09/20/2021 08/31/2021 08/30/2021 02/07/2021 04/11/2020 04/11/2020 02/23/2020  Does Patient Have a Medical Advance Directive? No - No No No No No  Would patient like information on creating a medical advance directive? Yes (MAU/Ambulatory/Procedural Areas - Information given) No - Patient declined - No - Patient declined No - Patient declined - No - Patient declined    Current Medications (verified) Outpatient Encounter Medications as of 09/20/2021  Medication Sig   acetaminophen (TYLENOL) 500 MG tablet Take 500 mg by mouth every 6 (six) hours as  needed.   ASPIRIN LOW DOSE 81 MG EC tablet TAKE 1 TABLET BY MOUTH EVERY DAY   budesonide-formoterol (SYMBICORT) 160-4.5 MCG/ACT inhaler TAKE 2 PUFFS BY MOUTH TWICE A DAY   diphenhydrAMINE (BENADRYL) 25 MG tablet Take 25 mg by mouth daily as needed for itching.    doxylamine, Sleep, (UNISOM) 25 MG tablet Take 25 mg by mouth at bedtime as needed.   ELIQUIS 5 MG TABS tablet TAKE 1 TABLET BY MOUTH TWICE A DAY   esomeprazole (NEXIUM) 20 MG capsule Take 20 mg by mouth daily as needed (heartburn).    Multiple Vitamins-Minerals (MULTIVITAMIN WITH MINERALS) tablet Take 1 tablet by mouth daily.   albuterol (VENTOLIN HFA) 108 (90 Base) MCG/ACT inhaler Inhale 2 puffs into the lungs every 6 (six) hours as needed for shortness of breath. (Patient not taking: Reported on 09/20/2021)   ibuprofen (ADVIL) 200 MG tablet Take 200-400 mg by mouth every 6 (six) hours as needed for fever or mild pain.  (Patient not taking: Reported on 09/20/2021)   losartan (COZAAR) 50 MG tablet Take 1 tablet (50 mg total) by mouth daily.   No facility-administered encounter medications on file as of 09/20/2021.    Allergies (verified) Fentanyl, Prozac [fluoxetine hcl], and Statins   History: Past Medical History:  Diagnosis Date   Cataract    Chicken pox    GERD (gastroesophageal reflux disease)    Hyperlipidemia    PAD (peripheral artery disease) ([facility])    Pain in both lower extremities 11/23/2016   Peripheral vascular disease ([facility])    Pre-diabetes    Seizures ([facility])    None since age 1  Past Surgical History:  Procedure Laterality Date   BACK SURGERY     COLONOSCOPY WITH PROPOFOL N/A 02/07/2021   Procedure: COLONOSCOPY WITH PROPOFOL;  Surgeon: Jonathon Bellows, MD;  Location[facility];  Service: Gastroenterology;  Laterality: N/A;   LAMINECTOMY  1987   LOWER EXTREMITY ANGIOGRAPHY Left 05/02/2017   Procedure: Lower Extremity Angiography;  Surgeon: Algernon Huxley, MD;  Location: [facility];  Service: Cardiovascular;   Laterality: Left;   LOWER EXTREMITY ANGIOGRAPHY Left 11/16/2019   Procedure: LOWER EXTREMITY ANGIOGRAPHY;  Surgeon: Algernon Huxley, MD;  Location: [place] [facility];  Service: Cardiovascular;  Laterality: Left;   LOWER EXTREMITY ANGIOGRAPHY Left 04/11/2020   Procedure: LOWER EXTREMITY ANGIOGRAPHY;  Surgeon: Algernon Huxley, MD;  Location: [facility];  Service: Cardiovascular;  Laterality: Left;   LOWER EXTREMITY ANGIOGRAPHY Left 04/12/2020   Procedure: Lower Extremity Angiography;  Surgeon: Algernon Huxley, MD;  Location: [place] [facility];  Service: Cardiovascular;  Laterality: Left;   LOWER EXTREMITY ANGIOGRAPHY Left 08/30/2021   Procedure: Lower Extremity Angiography;  Surgeon: Algernon Huxley, MD;  Location: [facility];  Service: Cardiovascular;  Laterality: Left;   LOWER EXTREMITY INTERVENTION Left 08/31/2021   Procedure: LOWER EXTREMITY INTERVENTION;  Surgeon: Algernon Huxley, MD;  Location: [place] [facility];  Service: Cardiovascular;  Laterality: Left;   TONSILLECTOMY     Family History  Problem Relation Age of Onset   Heart disease Mother        Arrthymia    Stroke Father        Deceased   Dementia Father    Heart disease Brother        Myocardial infarction   Colon cancer Neg Hx    Social History   Socioeconomic History   Marital status: Married    Spouse name: Not on file   Number of children: Not on file   Years of education: Not on file   Highest education level: Not on file  Occupational History   Not on file  Tobacco Use   Smoking status: Former    Packs/day: 1.00    Years: 50.00    Pack years: 50.00    Types: Cigarettes    Quit date: 11/21/2019    Years since quitting: 1.8   Smokeless tobacco: Never  Vaping Use   Vaping Use: Never used  Substance and Sexual Activity   Alcohol use: Not Currently    Alcohol/week: 0.0 standard drinks   Drug use: No   Sexual activity: Not on file  Other Topics Concern   Not on file  Social History Narrative    Retired.   Stays busy with household work, Surveyor, mining.   Married for 3 years.   Grown children.   Enjoys reading.    Social Determinants of Health   Financial Resource Strain: Low Risk    Difficulty of Paying Living Expenses: Not hard at all  Food Insecurity: No Food Insecurity   Worried About Charity fundraiser in the Last Year: Never true   [place] in the Last Year: Never true  Transportation Needs: No Transportation Needs   Lack of Transportation (Medical): No   Lack of Transportation (Non-Medical): No  Physical Activity: Inactive   Days of Exercise per Week: 0 days   Minutes of Exercise per Session: 0 min  Stress: No Stress Concern Present   Feeling of Stress : Not at all  Social Connections: Moderately Integrated  Frequency of Communication with Friends and Family: Once a week   Frequency of Social Gatherings with Friends and Family: Twice a week   Attends Religious Services: Never   Marine scientist or Organizations: Yes   Attends Music therapist: More than 4 times per year   Marital Status: Married    Tobacco Counseling Counseling given: Not Answered   Clinical Intake:  Pre-visit preparation completed: Yes  Pain : No/denies pain     BMI - recorded: 25.2 Nutritional Status: BMI 25 -29 Overweight Nutritional Risks: None Diabetes: No  How often do you need to have someone help you when you read instructions, pamphlets, or other written materials from your doctor or pharmacy?: 1 - Never  Diabetic? No  Interpreter Needed?: No  Information entered by :: Orrin Brigham LPN   Activities of Daily Living In your present state of health, do you have any difficulty performing the following activities: 09/20/2021 08/31/2021  Hearing? Y -  Comment wears hearing aids -  Vision? N -  Difficulty concentrating or making decisions? Y -  Walking or climbing stairs? Y -  Comment legs get tired quickly -  Dressing or bathing? N -  Doing  errands, shopping? N N  Preparing Food and eating ? N -  Using the Toilet? N -  In the past six months, have you accidently leaked urine? N -  Do you have problems with loss of bowel control? N -  Managing your Medications? Y -  Comment wife assist -  Managing your Finances? N -  Housekeeping or managing your Housekeeping? N -  Some recent data might be hidden    Patient Care Team: Pleas Koch, NP as PCP - General (Nurse Practitioner)  Indicate any recent Medical Services you may have received from other than [facility] providers in the past year (date may be approximate).     Assessment:   This is a routine wellness examination for [facility].  Hearing/Vision screen Hearing Screening - Comments:: Wears hearing aids  Vision Screening - Comments:: Last exam 2 years ago, Dr. Cherylann Banas  Dietary issues and exercise activities discussed: Current Exercise Habits: The patient does not participate in regular exercise at present   Goals Addressed             This Visit's Progress    Patient Stated       Would like to maintain current routine        Depression Screen PHQ 2/9 Scores 09/20/2021 02/23/2020 02/20/2019 02/10/2018 01/16/2016 01/17/2015  PHQ - 2 Score 0 0 0 1 0 4  PHQ- 9 Score - 0 - 3 - 7    Fall Risk Fall Risk  09/20/2021 02/23/2020 06/17/2019 02/20/2019 02/10/2018  Falls in the past year? 0 0 0 0 No  Comment - - Emmi Telephone Survey: data to providers prior to load - -  Number falls in past yr: 0 0 - 0 -  Injury with Fall? 0 0 - 0 -  Risk for fall due to : Impaired balance/gait Medication side effect - - -  Follow up Falls prevention discussed Falls evaluation completed;Falls prevention discussed - - -    FALL RISK PREVENTION PERTAINING TO THE HOME:  Any stairs in or around the home? Yes  If so, are there any without handrails? No  Home free of loose throw rugs in walkways, pet beds, electrical cords, etc? Yes  Adequate lighting in your home to reduce risk of falls? Yes  ASSISTIVE DEVICES UTILIZED TO PREVENT FALLS:  Life alert? No  Use of a cane, walker or w/c? No  Grab bars in the bathroom? Yes  Shower chair or bench in shower? No  Elevated toilet seat or a handicapped toilet? No   TIMED UP AND GO:  Was the test performed? No .   Cognitive Function: Normal cognitive status assessed by  this Nurse Health Advisor. No abnormalities found.   MMSE - Mini Mental State Exam 02/23/2020 02/10/2018  Orientation to time 5 5  Orientation to Place 5 5  Registration 3 3  Attention/ Calculation 5 0  Recall 3 3  Language- name 2 objects - 0  Language- repeat 1 1  Language- follow 3 step command - 3  Language- read & follow direction - 0  Write a sentence - 0  Copy design - 0  Total score - 20     6CIT Screen 09/20/2021  What Year? 0 points  What month? 0 points  What time? 0 points  Count back from 20 2 points  Months in reverse 4 points  Repeat phrase 0 points  Total Score 6    Immunizations Immunization History  Administered Date(s) Administered   Influenza-Unspecified 05/23/2018    TDAP status: Due, Education has been provided regarding the importance of this vaccine. Advised may receive this vaccine at local pharmacy or Health Dept. Aware to provide a copy of the vaccination record if obtained from local pharmacy or Health Dept. Verbalized acceptance and understanding.  Flu Vaccine status: Declined, Education has been provided regarding the importance of this vaccine but patient still declined. Advised may receive this vaccine at local pharmacy or Health Dept. Aware to provide a copy of the vaccination record if obtained from local pharmacy or Health Dept. Verbalized acceptance and understanding.  Pneumococcal vaccine status: Declined,  Education has been provided regarding the importance of this vaccine but patient still declined. Advised may receive this vaccine at local pharmacy or Health Dept. Aware to provide a copy of the vaccination  record if obtained from local pharmacy or Health Dept. Verbalized acceptance and understanding.   Covid-19 vaccine status: Declined, Education has been provided regarding the importance of this vaccine but patient still declined. Advised may receive this vaccine at local pharmacy or Health Dept.or vaccine clinic. Aware to provide a copy of the vaccination record if obtained from local pharmacy or Health Dept. Verbalized acceptance and understanding.  Qualifies for Shingles Vaccine? Yes   Zostavax completed No   Shingrix Completed?: No.    Education has been provided regarding the importance of this vaccine. Patient has been advised to call insurance company to determine out of pocket expense if they have not yet received this vaccine. Advised may also receive vaccine at local pharmacy or Health Dept. Verbalized acceptance and understanding.  Screening Tests Health Maintenance  Topic Date Due   COVID-19 Vaccine (1) Never done   Pneumonia Vaccine 70+ Years old (1 - PCV) Never done   Zoster Vaccines- Shingrix (1 of 2) Never done   TETANUS/TDAP  07/23/2020   INFLUENZA VACCINE  02/20/2021   COLONOSCOPY (Pts 45-74yrs Insurance coverage will need to be confirmed)  02/08/2024   Hepatitis C Screening  Completed   HPV VACCINES  Aged Out    Health Maintenance  Health Maintenance Due  Topic Date Due   COVID-19 Vaccine (1) Never done   Pneumonia Vaccine 90+ Years old (1 - PCV) Never done   Zoster Vaccines- Shingrix (1 of 2) Never  done   TETANUS/TDAP  07/23/2020   INFLUENZA VACCINE  02/20/2021    Colorectal cancer screening: Type of screening: Colonoscopy. Completed 02/07/21. Repeat every 3 years  Lung Cancer Screening: (Low Dose CT Chest recommended if Age 48-80 years, 30 pack-year currently smoking OR have quit w/in 15years.) does qualify. Lung CT scan ordered 03/24/21    Additional Screening:  Hepatitis C Screening: does qualify; Completed 02/10/18  Vision Screening: Recommended annual  ophthalmology exams for early detection of glaucoma and other disorders of the eye. Is the patient up to date with their annual eye exam?  No  Who is the provider or what is the name of the office in which the patient attends annual eye exams? Dr. Cherylann Banas  Dental Screening: Recommended annual dental exams for proper oral hygiene  Community Resource Referral / Chronic Care Management: CRR required this visit?  No   CCM required this visit?  No      Plan:     I have personally reviewed and noted the following in the patients chart:   Medical and social history Use of alcohol, tobacco or illicit drugs  Current medications and supplements including opioid prescriptions. Patient is not currently taking opioid prescriptions. Functional ability and status Nutritional status Physical activity Advanced directives List of other physicians Hospitalizations, surgeries, and ER visits in previous 12 months Vitals Screenings to include cognitive, depression, and falls Referrals and appointments  In addition, I have reviewed and discussed with patient certain preventive protocols, quality metrics, and best practice recommendations. A written personalized care plan for preventive services as well as general preventive health recommendations were provided to patient.   Due to this being a telephonic visit, the after visit summary with patients personalized plan was offered to patient via mail or my-chart.  Patient would like to access on my-chart.     Loma Messing, LPN   07/29/4942   Nurse Health Advisor  Nurse Notes: none

## 2021-09-20 ENCOUNTER — Ambulatory Visit (INDEPENDENT_AMBULATORY_CARE_PROVIDER_SITE_OTHER): Payer: Medicare Other

## 2021-09-20 VITALS — Ht 76.0 in | Wt 207.0 lb

## 2021-09-20 DIAGNOSIS — Z0000 Encounter for general adult medical examination without abnormal findings: Secondary | ICD-10-CM

## 2021-09-20 NOTE — Patient Instructions (Signed)
[name] , Thank you for taking time to complete your Medicare Wellness Visit. I appreciate your ongoing commitment to your health goals. Please review the following plan we discussed and let me know if I can assist you in the future.   Screening recommendations/referrals: Colonoscopy: up to date, completed 02/07/21, due 02/08/24 Recommended yearly ophthalmology/optometry visit for glaucoma screening and checkup Recommended yearly dental visit for hygiene and checkup  Vaccinations: Influenza vaccine: due, please discuss with PCP if you change your mind Pneumococcal vaccine: due, please discuss with PCP if you change your mind Tdap vaccine: due, last completed 07/23/10, Medicare may cover in the event you are cut or injured  Shingles vaccine: due, please discuss with PCP if you change your mind   Covid-19: newest booster available at your local pharmacy  Advanced directives: Please bring a copy of Living Will and/or [place] for your chart, when available   Conditions/risks identified: see problem list   Next appointment: Follow up in one year for your annual wellness visit. 09/24/22 @ 11:15am, this will be a telephone visit  Preventive Care 73 Years and Older, Male Preventive care refers to lifestyle choices and visits with your health care provider that can promote health and wellness. What does preventive care include? A yearly physical exam. This is also called an annual well check. Dental exams once or twice a year. Routine eye exams. Ask your health care provider how often you should have your eyes checked. Personal lifestyle choices, including: Daily care of your teeth and gums. Regular physical activity. Eating a healthy diet. Avoiding tobacco and drug use. Limiting alcohol use. Practicing safe sex. Taking low doses of aspirin every day. Taking vitamin and mineral supplements as recommended by your health care provider. What happens during an annual  well check? The services and screenings done by your health care provider during your annual well check will depend on your age, overall health, lifestyle risk factors, and family history of disease. Counseling  Your health care provider may ask you questions about your: Alcohol use. Tobacco use. Drug use. Emotional well-being. Home and relationship well-being. Sexual activity. Eating habits. History of falls. Memory and ability to understand (cognition). Work and work Statistician. Screening  You may have the following tests or measurements: Height, weight, and BMI. Blood pressure. Lipid and cholesterol levels. These may be checked every 5 years, or more frequently if you are over 56 years old. Skin check. Lung cancer screening. You may have this screening every year starting at age 28 if you have a 30-pack-year history of smoking and currently smoke or have quit within the past 15 years. Fecal occult blood test (FOBT) of the stool. You may have this test every year starting at age 69. Flexible sigmoidoscopy or colonoscopy. You may have a sigmoidoscopy every 5 years or a colonoscopy every 10 years starting at age 21. Prostate cancer screening. Recommendations will vary depending on your family history and other risks. Hepatitis C blood test. Hepatitis B blood test. Sexually transmitted disease (STD) testing. Diabetes screening. This is done by checking your blood sugar (glucose) after you have not eaten for a while (fasting). You may have this done every 1-3 years. Abdominal aortic aneurysm (AAA) screening. You may need this if you are a current or former smoker. Osteoporosis. You may be screened starting at age 64 if you are at high risk. Talk with your health care provider about your test results, treatment options, and if necessary, the need for more  tests. Vaccines  Your health care provider may recommend certain vaccines, such as: Influenza vaccine. This is recommended every  year. Tetanus, diphtheria, and acellular pertussis (Tdap, Td) vaccine. You may need a Td booster every 10 years. Zoster vaccine. You may need this after age 63. Pneumococcal 13-valent conjugate (PCV13) vaccine. One dose is recommended after age 1. Pneumococcal polysaccharide (PPSV23) vaccine. One dose is recommended after age 37. Talk to your health care provider about which screenings and vaccines you need and how often you need them. This information is not intended to replace advice given to you by your health care provider. Make sure you discuss any questions you have with your health care provider. Document Released: 08/05/2015 Document Revised: 03/28/2016 Document Reviewed: 05/10/2015 Elsevier Interactive Patient Education  2017 [place] Prevention in the Home Falls can cause injuries. They can happen to people of all ages. There are many things you can do to make your home safe and to help prevent falls. What can I do on the outside of my home? Regularly fix the edges of walkways and driveways and fix any cracks. Remove anything that might make you trip as you walk through a door, such as a raised step or threshold. Trim any bushes or trees on the path to your home. Use bright outdoor lighting. Clear any walking paths of anything that might make someone trip, such as rocks or tools. Regularly check to see if handrails are loose or broken. Make sure that both sides of any steps have handrails. Any raised decks and porches should have guardrails on the edges. Have any leaves, snow, or ice cleared regularly. Use sand or salt on walking paths during winter. Clean up any spills in your garage right away. This includes oil or grease spills. What can I do in the bathroom? Use night lights. Install grab bars by the toilet and in the tub and shower. Do not use towel bars as grab bars. Use non-skid mats or decals in the tub or shower. If you need to sit down in the shower, use a  plastic, non-slip stool. Keep the floor dry. Clean up any water that spills on the floor as soon as it happens. Remove soap buildup in the tub or shower regularly. Attach bath mats securely with double-sided non-slip rug tape. Do not have throw rugs and other things on the floor that can make you trip. What can I do in the bedroom? Use night lights. Make sure that you have a light by your bed that is easy to reach. Do not use any sheets or blankets that are too big for your bed. They should not hang down onto the floor. Have a firm chair that has side arms. You can use this for support while you get dressed. Do not have throw rugs and other things on the floor that can make you trip. What can I do in the kitchen? Clean up any spills right away. Avoid walking on wet floors. Keep items that you use a lot in easy-to-reach places. If you need to reach something above you, use a strong step stool that has a grab bar. Keep electrical cords out of the way. Do not use floor polish or wax that makes floors slippery. If you must use wax, use non-skid floor wax. Do not have throw rugs and other things on the floor that can make you trip. What can I do with my stairs? Do not leave any items on the stairs. Make sure  that there are handrails on both sides of the stairs and use them. Fix handrails that are broken or loose. Make sure that handrails are as long as the stairways. Check any carpeting to make sure that it is firmly attached to the stairs. Fix any carpet that is loose or worn. Avoid having throw rugs at the top or bottom of the stairs. If you do have throw rugs, attach them to the floor with carpet tape. Make sure that you have a light switch at the top of the stairs and the bottom of the stairs. If you do not have them, ask someone to add them for you. What else can I do to help prevent falls? Wear shoes that: Do not have high heels. Have rubber bottoms. Are comfortable and fit you  well. Are closed at the toe. Do not wear sandals. If you use a stepladder: Make sure that it is fully opened. Do not climb a closed stepladder. Make sure that both sides of the stepladder are locked into place. Ask someone to hold it for you, if possible. Clearly mark and make sure that you can see: Any grab bars or handrails. First and last steps. Where the edge of each step is. Use tools that help you move around (mobility aids) if they are needed. These include: Canes. Walkers. Scooters. Crutches. Turn on the lights when you go into a dark area. Replace any light bulbs as soon as they burn out. Set up your furniture so you have a clear path. Avoid moving your furniture around. If any of your floors are uneven, fix them. If there are any pets around you, be aware of where they are. Review your medicines with your doctor. Some medicines can make you feel dizzy. This can increase your chance of falling. Ask your doctor what other things that you can do to help prevent falls. This information is not intended to replace advice given to you by your health care provider. Make sure you discuss any questions you have with your health care provider. Document Released: 05/05/2009 Document Revised: 12/15/2015 Document Reviewed: 08/13/2014 Elsevier Interactive Patient Education  2017 [employer].

## 2021-09-26 DIAGNOSIS — M62830 Muscle spasm of back: Secondary | ICD-10-CM | POA: Diagnosis not present

## 2021-09-26 DIAGNOSIS — M9905 Segmental and somatic dysfunction of pelvic region: Secondary | ICD-10-CM | POA: Diagnosis not present

## 2021-09-26 DIAGNOSIS — M5136 Other intervertebral disc degeneration, lumbar region: Secondary | ICD-10-CM | POA: Diagnosis not present

## 2021-09-26 DIAGNOSIS — M9903 Segmental and somatic dysfunction of lumbar region: Secondary | ICD-10-CM | POA: Diagnosis not present

## 2021-10-02 ENCOUNTER — Ambulatory Visit: Payer: Medicare Other | Admitting: Cardiovascular Disease

## 2021-10-10 ENCOUNTER — Encounter (INDEPENDENT_AMBULATORY_CARE_PROVIDER_SITE_OTHER): Payer: Medicare Other

## 2021-10-10 ENCOUNTER — Ambulatory Visit (INDEPENDENT_AMBULATORY_CARE_PROVIDER_SITE_OTHER): Payer: Medicare Other | Admitting: Vascular Surgery

## 2021-10-16 NOTE — Progress Notes (Signed)
Cardiology Office Note ? ?Date:  10/17/2021  ? ?ID:  Nathaniel [name], DOB 11/30/1950, MRN [id number] ? ?PCP:  Pleas Koch, NP  ? ?Chief Complaint  ?Patient presents with  ? 6 month follow up   ?  Patient c/o shortness of breath with mild exertion & running out of energy quickly. Medications reviewed by the patient verbally.   ? ? ?HPI:  ?Nathaniel [name] is a 71 year old gentleman with past medical history of ?PAD, carotid stenosis, subclavian stenosis ?With Dr. 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  He has had multiple interventions on that left leg as well as the right external iliac artery. ?Smoker, COPD ?Hyperlipidemia ?COVID April 2022 (and 2019) ?Hypertension ?DVT on eliquis, recurrent ?Who f/u for  shortness of breath, COVID 11/2020, hyperlipidemia ? ?LOV 9/22 ? ?2-4 hous naps during the day ?Works with bees ?No regular exercise program ? ?Missing doses of losartan ?Weight up slightly, less active ?Checking blood pressure before taking ?Patient has been typically running 120s to 150s average 130-1 40 before medication ? ?No chest pain or shortness of breath on exertion ?No symptoms concerning for angina ?Denies claudication symptoms, does not walk very far ? ?Does not want statins, previously tried Zetia not interested in retrying ?Previously discussed PCSK9 inhibitor ? ?EKG personally reviewed by myself on todays visit ?Normal sinus rhythm rate 83 bpm PACs ? ?Echocardiogram ?Low normal ejection fraction estimated 50% ?Normal RV function no significant valve disease ? ?Stress test 9/22 ?  Normal pharmacologic myocardial perfusion stress test without significant ischemia or scar. ?  Left ventricular function is normal (55-65%). ?  There is no significant coronary artery calcification.  Mild aortic atherosclerosis is noted on the attenuation correction CT. ?  This is a low risk study. ? ? ?02/2020: chest CT, ?Minimal aortic athero, no significant  coronary calcification noted ? ?noninvasive studies on 03/21/2020 showed 1 to 39% stenosis of the right carotid normal in the left carotid.  However the right subclavian was noted to be stenotic with turbulent flow. ? ?noninvasive study 6/22 ?showed only 39% internal carotid artery stenosis bilaterally.  The left subclavian artery is stenotic although the velocities are consistent with previous studies.  The right clavian artery had normal flow hemodynamics. ? ? ? ?PMH:   has a past medical history of Cataract, Chicken pox, GERD (gastroesophageal reflux disease), Hyperlipidemia, PAD (peripheral artery disease) ([place]), Pain in both lower extremities (11/23/2016), Peripheral vascular disease ([place]), Pre-diabetes, and Seizures ([place]). ? ?PSH:    ?Past Surgical History:  ?Procedure Laterality Date  ? BACK SURGERY    ? COLONOSCOPY WITH PROPOFOL N/A 02/07/2021  ? Procedure: COLONOSCOPY WITH PROPOFOL;  Surgeon: Jonathon Bellows, MD;  Location: [facility] ENDOSCOPY;  Service: Gastroenterology;  Laterality: N/A;  ? LAMINECTOMY  1987  ? LOWER EXTREMITY ANGIOGRAPHY Left 05/02/2017  ? Procedure: Lower Extremity Angiography;  Surgeon: Algernon Huxley, MD;  Location: [place] [facility];  Service: Cardiovascular;  Laterality: Left;  ? LOWER EXTREMITY ANGIOGRAPHY Left 11/16/2019  ? Procedure: LOWER EXTREMITY ANGIOGRAPHY;  Surgeon: Algernon Huxley, MD;  Location: [place] [facility];  Service: Cardiovascular;  Laterality: Left;  ? LOWER EXTREMITY ANGIOGRAPHY Left 04/11/2020  ? Procedure: LOWER EXTREMITY ANGIOGRAPHY;  Surgeon: Algernon Huxley, MD;  Location: [place] [facility];  Service: Cardiovascular;  Laterality: Left;  ? LOWER EXTREMITY ANGIOGRAPHY Left 04/12/2020  ? Procedure: Lower Extremity Angiography;  Surgeon: Algernon Huxley, MD;  Location: [place] [facility];  Service:  Cardiovascular;  Laterality: Left;  ? LOWER EXTREMITY ANGIOGRAPHY Left 08/30/2021  ? Procedure: Lower Extremity Angiography;  Surgeon: Algernon Huxley, MD;  Location: [place] [facility];  Service: Cardiovascular;  Laterality: Left;  ? LOWER EXTREMITY INTERVENTION Left 08/31/2021  ? Procedure: LOWER EXTREMITY INTERVENTION;  Surgeon: Algernon Huxley, MD;  Location: [place] [facility];  Service: Cardiovascular;  Laterality: Left;  ? TONSILLECTOMY    ? ? ?Current Outpatient Medications  ?Medication Sig Dispense Refill  ? acetaminophen (TYLENOL) 500 MG tablet Take 500 mg by mouth every 6 (six) hours as needed.    ? ASPIRIN LOW DOSE 81 MG EC tablet TAKE 1 TABLET BY MOUTH EVERY DAY 90 tablet 3  ? budesonide-formoterol (SYMBICORT) 160-4.5 MCG/ACT inhaler TAKE 2 PUFFS BY MOUTH TWICE A DAY 10.2 each 5  ? doxylamine, Sleep, (UNISOM) 25 MG tablet Take 25 mg by mouth at bedtime as needed.    ? ELIQUIS 5 MG TABS tablet TAKE 1 TABLET BY MOUTH TWICE A DAY 60 tablet 11  ? esomeprazole (NEXIUM) 20 MG capsule Take 20 mg by mouth daily as needed (heartburn).     ? ibuprofen (ADVIL) 200 MG tablet Take 200-400 mg by mouth every 6 (six) hours as needed for fever or mild pain.    ? losartan (COZAAR) 50 MG tablet Take 1 tablet (50 mg total) by mouth daily. 90 tablet 3  ? Multiple Vitamins-Minerals (MULTIVITAMIN WITH MINERALS) tablet Take 1 tablet by mouth daily.    ? albuterol (VENTOLIN HFA) 108 (90 Base) MCG/ACT inhaler Inhale 2 puffs into the lungs every 6 (six) hours as needed for shortness of breath. (Patient not taking: Reported on 09/20/2021) 8 g 0  ? diphenhydrAMINE (BENADRYL) 25 MG tablet Take 25 mg by mouth daily as needed for itching.  (Patient not taking: Reported on 10/17/2021)    ? ?No current facility-administered medications for this visit.  ? ? ? ?Allergies:   Fentanyl, Prozac [fluoxetine hcl], and Statins  ? ?Social History:  The patient  reports that he quit smoking about 22 months ago. His smoking use included cigarettes. He has a 50.00 pack-year smoking history. He has never used smokeless tobacco. He reports that he does not currently use alcohol. He reports that he does not use drugs.  ? ?Family  History:   family history includes Dementia in his father; Heart disease in his brother and mother; Stroke in his father.  ? ? ?Review of Systems: ?Review of Systems  ?Constitutional: Negative.   ?HENT: Negative.    ?Respiratory: Negative.    ?Cardiovascular: Negative.   ?Gastrointestinal: Negative.   ?Musculoskeletal: Negative.   ?Neurological: Negative.   ?Psychiatric/Behavioral: Negative.    ?All other systems reviewed and are negative. ? ? ?PHYSICAL EXAM: ?VS:  BP (!) 150/92 (BP Location: Left Arm, Patient Position: Sitting, Cuff Size: Normal)   Pulse 83   Ht '6\' 4"'$  (1.93 m)   Wt 224 lb 8 oz (101.8 kg)   SpO2 98%   BMI 27.33 kg/m?  , BMI Body mass index is 27.33 kg/m?Marland Kitchen ?Constitutional:  oriented to person, place, and time. No distress.  ?HENT:  ?Head: Grossly normal ?Eyes:  no discharge. No scleral icterus.  ?Neck: No JVD, no carotid bruits  ?Cardiovascular: Regular rate and rhythm, no murmurs appreciated ?Pulmonary/Chest: Clear to auscultation bilaterally, no wheezes or rails ?Abdominal: Soft.  no distension.  no tenderness.  ?Musculoskeletal: Normal range of motion ?Neurological:  normal muscle tone. Coordination normal. No atrophy ?Skin: Skin warm and  dry ?Psychiatric: normal affect, pleasant ? ?Recent Labs: ?04/19/2021: ALT 18 ?05/04/2021: Potassium 4.3; Sodium 144 ?08/31/2021: BUN 20; Creatinine, Ser 1.17; Hemoglobin 12.2; Platelets 213  ? ? ?Lipid Panel ?Lab Results  ?Component Value Date  ? CHOL 207 (H) 04/19/2021  ? HDL 41.20 04/19/2021  ? LDLCALC 132 (H) 04/19/2021  ? TRIG 167.0 (H) 04/19/2021  ? ?  ? ?Wt Readings from Last 3 Encounters:  ?10/17/21 224 lb 8 oz (101.8 kg)  ?09/20/21 207 lb (93.9 kg)  ?08/30/21 207 lb (93.9 kg)  ?  ? ?ASSESSMENT AND PLAN: ? ?Problem List Items Addressed This Visit   ? ?  ? Cardiology Problems  ? Hyperlipidemia  ? Stenosis of carotid artery  ? Atherosclerosis of native arteries of extremity with intermittent claudication ([place]) - Primary  ?  ? Other  ? Tobacco abuse  ?  Centrilobular emphysema ([place])  ? Chest discomfort  ? ?Other Visit Diagnoses   ? ? PAD (peripheral artery disease) ([place])      ? 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 ?  ?Peripheral arterial disease ?Followed b

## 2021-10-17 ENCOUNTER — Ambulatory Visit (INDEPENDENT_AMBULATORY_CARE_PROVIDER_SITE_OTHER): Payer: Medicare Other | Admitting: Cardiovascular Disease

## 2021-10-17 ENCOUNTER — Other Ambulatory Visit: Payer: Self-pay

## 2021-10-17 ENCOUNTER — Encounter: Payer: Self-pay | Admitting: Cardiovascular Disease

## 2021-10-17 VITALS — BP 150/92 | HR 83 | Ht 76.0 in | Wt 224.5 lb

## 2021-10-17 DIAGNOSIS — R0789 Other chest pain: Secondary | ICD-10-CM | POA: Diagnosis not present

## 2021-10-17 DIAGNOSIS — I6523 Occlusion and stenosis of bilateral carotid arteries: Secondary | ICD-10-CM | POA: Diagnosis not present

## 2021-10-17 DIAGNOSIS — I739 Peripheral vascular disease, unspecified: Secondary | ICD-10-CM | POA: Diagnosis not present

## 2021-10-17 DIAGNOSIS — Z720 Tobacco use: Secondary | ICD-10-CM

## 2021-10-17 DIAGNOSIS — I201 Angina pectoris with documented spasm: Secondary | ICD-10-CM

## 2021-10-17 DIAGNOSIS — I6529 Occlusion and stenosis of unspecified carotid artery: Secondary | ICD-10-CM

## 2021-10-17 DIAGNOSIS — J432 Centrilobular emphysema: Secondary | ICD-10-CM | POA: Diagnosis not present

## 2021-10-17 DIAGNOSIS — I70212 Atherosclerosis of native arteries of extremities with intermittent claudication, left leg: Secondary | ICD-10-CM

## 2021-10-17 DIAGNOSIS — E785 Hyperlipidemia, unspecified: Secondary | ICD-10-CM

## 2021-10-17 NOTE — Patient Instructions (Addendum)
Read about Leqvio, cholesterol shot ? ?Monitor blood pressure 2 hours after meds ? ?Medication Instructions:  ?Your physician recommends that you continue on your current medications as directed. Please refer to the Current Medication list given to you today.  ? ?If you need a refill on your cardiac medications before your next appointment, please call your pharmacy.  ? ?Lab work: ?No new labs needed ? ?Testing/Procedures: ?No new testing needed ? ?Follow-Up: ?At [facility], you and your health needs are our priority.  As part of our continuing mission to provide you with exceptional heart care, we have created designated Provider Care Teams.  These Care Teams include your primary Cardiologist (physician) and Advanced Practice Providers (APPs -  Physician Assistants and Nurse Practitioners) who all work together to provide you with the care you need, when you need it. ? ?You will need a follow up appointment in 12 months ? ?Providers on your designated Care Team:   ?Murray Hodgkins, NP ?Christell Faith, PA-C ?Cadence Kathlen Mody, PA-C ? ?COVID-19 Vaccine Information can be found at: [email or web address].[email or web address] For questions related to vaccine distribution or appointments, please email [email or web address] or call [phone].  ? ?

## 2021-10-23 ENCOUNTER — Telehealth: Payer: Self-pay | Admitting: *Deleted

## 2021-10-23 DIAGNOSIS — Z006 Encounter for examination for normal comparison and control in clinical research program: Secondary | ICD-10-CM

## 2021-10-23 NOTE — Telephone Encounter (Signed)
I called patient to let him know about a new research study called [employer] which is looking at a new drug to lower LDL for patients on statin drug or intolerant to statin drugs. I discussed the study with the patient. He told me he was not interested because he does not like to drive to [place].I thanked him for his time. ?

## 2021-10-24 DIAGNOSIS — M62830 Muscle spasm of back: Secondary | ICD-10-CM | POA: Diagnosis not present

## 2021-10-24 DIAGNOSIS — M9903 Segmental and somatic dysfunction of lumbar region: Secondary | ICD-10-CM | POA: Diagnosis not present

## 2021-10-24 DIAGNOSIS — M9905 Segmental and somatic dysfunction of pelvic region: Secondary | ICD-10-CM | POA: Diagnosis not present

## 2021-10-24 DIAGNOSIS — M5136 Other intervertebral disc degeneration, lumbar region: Secondary | ICD-10-CM | POA: Diagnosis not present

## 2021-11-21 DIAGNOSIS — M9905 Segmental and somatic dysfunction of pelvic region: Secondary | ICD-10-CM | POA: Diagnosis not present

## 2021-11-21 DIAGNOSIS — M62830 Muscle spasm of back: Secondary | ICD-10-CM | POA: Diagnosis not present

## 2021-11-21 DIAGNOSIS — M9903 Segmental and somatic dysfunction of lumbar region: Secondary | ICD-10-CM | POA: Diagnosis not present

## 2021-11-21 DIAGNOSIS — M5136 Other intervertebral disc degeneration, lumbar region: Secondary | ICD-10-CM | POA: Diagnosis not present

## 2021-12-19 DIAGNOSIS — M5136 Other intervertebral disc degeneration, lumbar region: Secondary | ICD-10-CM | POA: Diagnosis not present

## 2021-12-19 DIAGNOSIS — M9905 Segmental and somatic dysfunction of pelvic region: Secondary | ICD-10-CM | POA: Diagnosis not present

## 2021-12-19 DIAGNOSIS — M9903 Segmental and somatic dysfunction of lumbar region: Secondary | ICD-10-CM | POA: Diagnosis not present

## 2021-12-19 DIAGNOSIS — M62830 Muscle spasm of back: Secondary | ICD-10-CM | POA: Diagnosis not present

## 2021-12-27 ENCOUNTER — Other Ambulatory Visit (INDEPENDENT_AMBULATORY_CARE_PROVIDER_SITE_OTHER): Payer: Self-pay | Admitting: Vascular Surgery

## 2022-01-15 DIAGNOSIS — M62830 Muscle spasm of back: Secondary | ICD-10-CM | POA: Diagnosis not present

## 2022-01-15 DIAGNOSIS — M5136 Other intervertebral disc degeneration, lumbar region: Secondary | ICD-10-CM | POA: Diagnosis not present

## 2022-01-15 DIAGNOSIS — M9905 Segmental and somatic dysfunction of pelvic region: Secondary | ICD-10-CM | POA: Diagnosis not present

## 2022-01-15 DIAGNOSIS — M9903 Segmental and somatic dysfunction of lumbar region: Secondary | ICD-10-CM | POA: Diagnosis not present

## 2022-02-12 DIAGNOSIS — M9903 Segmental and somatic dysfunction of lumbar region: Secondary | ICD-10-CM | POA: Diagnosis not present

## 2022-02-12 DIAGNOSIS — M5136 Other intervertebral disc degeneration, lumbar region: Secondary | ICD-10-CM | POA: Diagnosis not present

## 2022-02-12 DIAGNOSIS — M9905 Segmental and somatic dysfunction of pelvic region: Secondary | ICD-10-CM | POA: Diagnosis not present

## 2022-02-12 DIAGNOSIS — M62830 Muscle spasm of back: Secondary | ICD-10-CM | POA: Diagnosis not present

## 2022-03-09 ENCOUNTER — Ambulatory Visit
Admission: RE | Admit: 2022-03-09 | Discharge: 2022-03-09 | Disposition: A | Discharge status: Home / Self Care | Payer: Medicare Other | Source: Ambulatory Visit | Attending: Primary Care | Admitting: Primary Care

## 2022-03-09 DIAGNOSIS — Z87891 Personal history of nicotine dependence: Secondary | ICD-10-CM | POA: Insufficient documentation

## 2022-03-09 DIAGNOSIS — F17210 Nicotine dependence, cigarettes, uncomplicated: Secondary | ICD-10-CM | POA: Insufficient documentation

## 2022-03-13 ENCOUNTER — Other Ambulatory Visit: Payer: Self-pay | Admitting: Acute Care

## 2022-03-13 DIAGNOSIS — M9903 Segmental and somatic dysfunction of lumbar region: Secondary | ICD-10-CM | POA: Diagnosis not present

## 2022-03-13 DIAGNOSIS — Z87891 Personal history of nicotine dependence: Secondary | ICD-10-CM

## 2022-03-13 DIAGNOSIS — M62830 Muscle spasm of back: Secondary | ICD-10-CM | POA: Diagnosis not present

## 2022-03-13 DIAGNOSIS — M5136 Other intervertebral disc degeneration, lumbar region: Secondary | ICD-10-CM | POA: Diagnosis not present

## 2022-03-13 DIAGNOSIS — M9905 Segmental and somatic dysfunction of pelvic region: Secondary | ICD-10-CM | POA: Diagnosis not present

## 2022-03-13 DIAGNOSIS — F17210 Nicotine dependence, cigarettes, uncomplicated: Secondary | ICD-10-CM

## 2022-03-13 DIAGNOSIS — Z122 Encounter for screening for malignant neoplasm of respiratory organs: Secondary | ICD-10-CM

## 2022-04-01 ENCOUNTER — Other Ambulatory Visit: Payer: Self-pay | Admitting: Cardiovascular Disease

## 2022-04-10 DIAGNOSIS — M9905 Segmental and somatic dysfunction of pelvic region: Secondary | ICD-10-CM | POA: Diagnosis not present

## 2022-04-10 DIAGNOSIS — M9903 Segmental and somatic dysfunction of lumbar region: Secondary | ICD-10-CM | POA: Diagnosis not present

## 2022-04-10 DIAGNOSIS — M62830 Muscle spasm of back: Secondary | ICD-10-CM | POA: Diagnosis not present

## 2022-04-10 DIAGNOSIS — M5136 Other intervertebral disc degeneration, lumbar region: Secondary | ICD-10-CM | POA: Diagnosis not present

## 2022-05-08 DIAGNOSIS — M62830 Muscle spasm of back: Secondary | ICD-10-CM | POA: Diagnosis not present

## 2022-05-08 DIAGNOSIS — M5136 Other intervertebral disc degeneration, lumbar region: Secondary | ICD-10-CM | POA: Diagnosis not present

## 2022-05-08 DIAGNOSIS — M9903 Segmental and somatic dysfunction of lumbar region: Secondary | ICD-10-CM | POA: Diagnosis not present

## 2022-05-08 DIAGNOSIS — M9905 Segmental and somatic dysfunction of pelvic region: Secondary | ICD-10-CM | POA: Diagnosis not present

## 2022-05-14 ENCOUNTER — Other Ambulatory Visit (INDEPENDENT_AMBULATORY_CARE_PROVIDER_SITE_OTHER): Payer: Self-pay | Admitting: Nurse Practitioner

## 2022-05-21 ENCOUNTER — Encounter (INDEPENDENT_AMBULATORY_CARE_PROVIDER_SITE_OTHER): Payer: Self-pay

## 2022-06-01 ENCOUNTER — Other Ambulatory Visit (INDEPENDENT_AMBULATORY_CARE_PROVIDER_SITE_OTHER): Payer: Self-pay | Admitting: Vascular Surgery

## 2022-06-01 DIAGNOSIS — I739 Peripheral vascular disease, unspecified: Secondary | ICD-10-CM

## 2022-06-04 ENCOUNTER — Ambulatory Visit (INDEPENDENT_AMBULATORY_CARE_PROVIDER_SITE_OTHER): Payer: Medicare Other

## 2022-06-04 ENCOUNTER — Encounter (INDEPENDENT_AMBULATORY_CARE_PROVIDER_SITE_OTHER): Payer: Self-pay | Admitting: Nurse Practitioner

## 2022-06-04 ENCOUNTER — Ambulatory Visit (INDEPENDENT_AMBULATORY_CARE_PROVIDER_SITE_OTHER): Payer: Medicare Other | Admitting: Nurse Practitioner

## 2022-06-04 VITALS — BP 133/91 | HR 89 | Resp 16 | Wt 221.5 lb

## 2022-06-04 DIAGNOSIS — I70212 Atherosclerosis of native arteries of extremities with intermittent claudication, left leg: Secondary | ICD-10-CM

## 2022-06-04 DIAGNOSIS — I739 Peripheral vascular disease, unspecified: Secondary | ICD-10-CM

## 2022-06-04 DIAGNOSIS — Z720 Tobacco use: Secondary | ICD-10-CM

## 2022-06-04 DIAGNOSIS — J432 Centrilobular emphysema: Secondary | ICD-10-CM | POA: Diagnosis not present

## 2022-06-05 DIAGNOSIS — M9903 Segmental and somatic dysfunction of lumbar region: Secondary | ICD-10-CM | POA: Diagnosis not present

## 2022-06-05 DIAGNOSIS — M5136 Other intervertebral disc degeneration, lumbar region: Secondary | ICD-10-CM | POA: Diagnosis not present

## 2022-06-05 DIAGNOSIS — M62830 Muscle spasm of back: Secondary | ICD-10-CM | POA: Diagnosis not present

## 2022-06-05 DIAGNOSIS — M9905 Segmental and somatic dysfunction of pelvic region: Secondary | ICD-10-CM | POA: Diagnosis not present

## 2022-06-12 ENCOUNTER — Other Ambulatory Visit: Payer: Self-pay | Admitting: Primary Care

## 2022-06-12 DIAGNOSIS — J432 Centrilobular emphysema: Secondary | ICD-10-CM

## 2022-06-12 NOTE — Telephone Encounter (Signed)
Patient scheduled.

## 2022-06-12 NOTE — Telephone Encounter (Signed)
Patient is overdue for follow up and needs to be seen ASAP. This will be required prior to any further refills. Needs to be in person, not virtual.   Please schedule. Let me know once you've spoke with him. Thanks!

## 2022-06-18 NOTE — Progress Notes (Signed)
Subjective:    Patient ID: Nathaniel [name], male    DOB: 1950/09/14, 71 y.o.   MRN: [id number] Chief Complaint  Patient presents with   Follow-up    Ultrasound follow up    Nathaniel [name] is a 71 year old male who contacted our office letting us know that he had begun to experience worsening claudication-like symptoms.  Patient has a longstanding history of peripheral arterial disease with interventions bilaterally.  He has had multiple interventions on the left lower extremity with the most recent in February 2023.  He denies any rest pain like symptoms.  He denies any new open wounds or ulcerations.  Today the patient has ABI of 1.10 on the right and ABI 0.47 on the left.  Previous ABIs on 08/29/2021 (prior to intervention )showed an ABI 1.11 on the right and 0.47 on the left.  The patient has triphasic tibial artery waveforms on the right with monophasic tibial artery waveforms on the left.  There is a noted popliteal occlusion.    Review of Systems  Cardiovascular:        Claudication  All other systems reviewed and are negative.      Objective:   Physical Exam Vitals reviewed.  HENT:     Head: Normocephalic.  Cardiovascular:     Rate and Rhythm: Normal rate.     Pulses:          Dorsalis pedis pulses are detected w/ Doppler on the left side.       Posterior tibial pulses are detected w/ Doppler on the left side.  Pulmonary:     Effort: Pulmonary effort is normal.  Skin:    General: Skin is warm and dry.  Neurological:     Mental Status: He is alert and oriented to person, place, and time.  Psychiatric:        Mood and Affect: Mood normal.        Behavior: Behavior normal.        Thought Content: Thought content normal.        Judgment: Judgment normal.     BP (!) 133/91 (BP Location: Left Arm)   Pulse 89   Resp 16   Wt 221 lb 8 oz (100.5 kg)   BMI 26.96 kg/m   Past Medical History:  Diagnosis Date   Cataract    Chicken pox    GERD (gastroesophageal  reflux disease)    Hyperlipidemia    PAD (peripheral artery disease) (HCC)    Pain in both lower extremities 11/23/2016   Peripheral vascular disease (HCC)    Pre-diabetes    Seizures (HCC)    None since age 73    Social History   Socioeconomic History   Marital status: Married    Spouse name: Not on file   Number of children: Not on file   Years of education: Not on file   Highest education level: Not on file  Occupational History   Not on file  Tobacco Use   Smoking status: Former    Packs/day: 1.00    Years: 50.00    Total pack years: 50.00    Types: Cigarettes    Quit date: 11/21/2019    Years since quitting: 2.5   Smokeless tobacco: Never  Vaping Use   Vaping Use: Never used  Substance and Sexual Activity   Alcohol use: Not Currently    Alcohol/week: 0.0 standard drinks of alcohol   Drug use: No   Sexual activity: Not on file  Other Topics Concern   Not on file  Social History Narrative   Retired.   Stays busy with household work, Surveyor, mining.   Married for 3 years.   Grown children.   Enjoys reading.    Social Determinants of Health   Financial Resource Strain: Low Risk  (09/20/2021)   Overall Financial Resource Strain (CARDIA)    Difficulty of Paying Living Expenses: Not hard at all  Food Insecurity: No Food Insecurity (09/20/2021)   Hunger Vital Sign    Worried About Running Out of Food in the Last Year: Never true    Ran Out of Food in the Last Year: Never true  Transportation Needs: No Transportation Needs (09/20/2021)   PRAPARE - Hydrologist (Medical): No    Lack of Transportation (Non-Medical): No  Physical Activity: Inactive (09/20/2021)   Exercise Vital Sign    Days of Exercise per Week: 0 days    Minutes of Exercise per Session: 0 min  Stress: No Stress Concern Present (09/20/2021)   [place]    Feeling of Stress : Not at all  Social Connections:  Moderately Integrated (09/20/2021)   Social Connection and Isolation Panel [NHANES]    Frequency of Communication with Friends and Family: Once a week    Frequency of Social Gatherings with Friends and Family: Twice a week    Attends Religious Services: Never    Marine scientist or Organizations: Yes    Attends Music therapist: More than 4 times per year    Marital Status: Married  Human resources officer Violence: Not At Risk (09/20/2021)   Humiliation, Afraid, Rape, and Kick questionnaire    Fear of Current or Ex-Partner: No    Emotionally Abused: No    Physically Abused: No    Sexually Abused: No    Past Surgical History:  Procedure Laterality Date   BACK SURGERY     COLONOSCOPY WITH PROPOFOL N/A 02/07/2021   Procedure: COLONOSCOPY WITH PROPOFOL;  Surgeon: Jonathon Bellows, MD;  Location: [facility];  Service: Gastroenterology;  Laterality: N/A;   LAMINECTOMY  1987   LOWER EXTREMITY ANGIOGRAPHY Left 05/02/2017   Procedure: Lower Extremity Angiography;  Surgeon: Algernon Huxley, MD;  Location: [place] [facility];  Service: Cardiovascular;  Laterality: Left;   LOWER EXTREMITY ANGIOGRAPHY Left 11/16/2019   Procedure: LOWER EXTREMITY ANGIOGRAPHY;  Surgeon: Algernon Huxley, MD;  Location: [place] [facility];  Service: Cardiovascular;  Laterality: Left;   LOWER EXTREMITY ANGIOGRAPHY Left 04/11/2020   Procedure: LOWER EXTREMITY ANGIOGRAPHY;  Surgeon: Algernon Huxley, MD;  Location: [place] [facility];  Service: Cardiovascular;  Laterality: Left;   LOWER EXTREMITY ANGIOGRAPHY Left 04/12/2020   Procedure: Lower Extremity Angiography;  Surgeon: Algernon Huxley, MD;  Location: [place] [facility];  Service: Cardiovascular;  Laterality: Left;   LOWER EXTREMITY ANGIOGRAPHY Left 08/30/2021   Procedure: Lower Extremity Angiography;  Surgeon: Algernon Huxley, MD;  Location: [place] [facility];  Service: Cardiovascular;  Laterality: Left;   LOWER EXTREMITY INTERVENTION Left 08/31/2021   Procedure: LOWER  EXTREMITY INTERVENTION;  Surgeon: Algernon Huxley, MD;  Location: [place] [facility];  Service: Cardiovascular;  Laterality: Left;   TONSILLECTOMY      Family History  Problem Relation Age of Onset   Heart disease Mother        Arrthymia    Stroke Father        Deceased  Dementia Father    Heart disease Brother        Myocardial infarction   Colon cancer Neg Hx     Allergies  Allergen Reactions   Fentanyl Other (See Comments)    chills   Prozac [Fluoxetine Hcl] Other (See Comments)    Extreme depression   Statins Other (See Comments)    Flu like symptoms       Latest Ref Rng & Units 08/31/2021    4:28 PM 08/31/2021   10:36 AM 08/31/2021    3:01 AM  CBC  WBC 4.0 - 10.5 K/uL 11.1  13.3  13.3   Hemoglobin 13.0 - 17.0 g/dL 12.2  12.4  12.7   Hematocrit 39.0 - 52.0 % 36.6  37.1  38.3   Platelets 150 - 400 K/uL 213  218  197       CMP     Component Value Date/Time   NA 144 05/04/2021 1038   K 4.3 05/04/2021 1038   CL 105 05/04/2021 1038   CO2 22 05/04/2021 1038   GLUCOSE 93 05/04/2021 1038   GLUCOSE 97 04/19/2021 0846   BUN 20 08/31/2021 1036   BUN 10 05/04/2021 1038   CREATININE 1.17 08/31/2021 1036   CALCIUM 9.2 05/04/2021 1038   PROT 6.6 04/19/2021 0846   ALBUMIN 4.0 04/19/2021 0846   AST 18 04/19/2021 0846   ALT 18 04/19/2021 0846   ALKPHOS 93 04/19/2021 0846   BILITOT 0.4 04/19/2021 0846   GFRNONAA >60 08/31/2021 1036   GFRAA >60 04/13/2020 0723     VAS [place] ABI WITH/WO TBI  Result Date: 06/11/2022  LOWER EXTREMITY DOPPLER STUDY Patient Name:  Nathaniel [name]  Date of Exam:   06/04/2022 Medical Rec #: [id number]            Accession #:    [id number] Date of Birth: Nov 28, 1950             Patient Gender: M Patient Age:   35 years Exam Location:  [facility] Procedure:      VAS [place] ABI WITH/WO TBI Referring Phys: Corene Cornea DEW --------------------------------------------------------------------------------  Indications: Claudication, rest pain, and  peripheral artery disease.  Vascular Interventions: 05/02/17: Right EIA PTA with left distal SFA-proximal                         popliteal artery stent;                         11/16/2019: Right EIA angioplasty, left SFA/popliteal                         thrombectomy/stent & left PTA angioplasty;                         9/20-21/2021 left pta thrombectomy new stent                          08/30/2021: Aortogram and Selective Lt Lower Extremity                         Angiogram. Catheter directed thrombolytic therapy with 8                         mg of TPA to the Left SFA and Popliteal Artery.  Mechanical thrombectomy to the Left SFA and Popliteal                         Artery with the [place] device. PTA of the Left PTA and                         Tibioperoneal Trunk with 3 mm diameter angioplasty                         balloon. PTA of the Left SFA and Popliteal Artery with 2                         inflations with a 5 mm diameter by 30 cm length Lutonix                         drug coated angioplasty balloon. Placement of a                         thrombolytic catheter for continuos thrombolytic therapy                         in the Left SFA, Popliteal Artery, Tibioperoneal trunk                         and Proximal PTA.                          08/31/2021: LLE Angiogram. Stent placement to the Left                         SFA with 6 mm diameter by 7.5 cm length Viabahn stent.                         Mechanical thrombectomy using the penumbra CAT 6 device                         to the Left SFA for Thrombus within the stent. PTA of                         the Left PTA and Tibioperoneal trunk with 3 mm diameter                         by 15 cm length angioplasty balloon. Comparison Study: 08/29/2021 Performing Technologist: Almira Coaster RVS  Examination Guidelines: A complete evaluation includes at minimum, Doppler waveform signals and systolic blood pressure reading at the level of  bilateral brachial, anterior tibial, and posterior tibial arteries, when vessel segments are accessible. Bilateral testing is considered an integral part of a complete examination. Photoelectric Plethysmograph (PPG) waveforms and toe systolic pressure readings are included as required and additional duplex testing as needed. Limited examinations for reoccurring indications may be performed as noted.  ABI Findings: +---------+------------------+-----+---------+--------+ Right    Rt Pressure (mmHg)IndexWaveform Comment  +---------+------------------+-----+---------+--------+ Brachial 161                                      +---------+------------------+-----+---------+--------+  ATA      168               1.04 triphasic         +---------+------------------+-----+---------+--------+ PTA      177               1.10 triphasic         +---------+------------------+-----+---------+--------+ Great Toe164               1.02                   +---------+------------------+-----+---------+--------+ +---------+------------------+-----+-------------------+-------+ Left     Lt Pressure (mmHg)IndexWaveform           Comment +---------+------------------+-----+-------------------+-------+ Brachial 161                                               +---------+------------------+-----+-------------------+-------+ ATA      47                0.29 dampened monophasic        +---------+------------------+-----+-------------------+-------+ PTA      75                0.47 monophasic                 +---------+------------------+-----+-------------------+-------+ Great Toe71                0.44 Abnormal                   +---------+------------------+-----+-------------------+-------+ +-------+-----------+-----------+------------+------------+ ABI/TBIToday's ABIToday's TBIPrevious ABIPrevious TBI +-------+-----------+-----------+------------+------------+ Right  1.10        1.02       1.11        .82          +-------+-----------+-----------+------------+------------+ Left   .47        .44        .47         .35          +-------+-----------+-----------+------------+------------+ Bilateral ABIs appear essentially unchanged compared to prior study on 08/29/2021. Bilateral TBIs appear decreased compared to prior study on 08/29/2021.  Summary: Right: Resting right ankle-brachial index is within normal range. The right toe-brachial index is normal. Left: Resting left ankle-brachial index indicates severe left lower extremity arterial disease. The left toe-brachial index is abnormal. Imaging and Waveforms obtained of the Left Popliteal Artery, PTA, ATA and Peroneal Artery. No flow detected in the Left Popliteal Artery. *See table(s) above for measurements and observations.   Electronically signed by Leotis Pain MD on 06/11/2022 at 1:01:09 PM.    Final        Assessment & Plan:   1. 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  Despite this he still continues to have diminished flow in the left lower extremity.  We discussed extensively with the patient and wife his treatment options.  The patient underwent multiple revascularization episodes of the left lower extremity at this time the patient does not wish to proceed with any further intervention.  Given that the patient does not have rest pain or ulceration this is a reasonable option.  The patient will continue with current medications.  He will also attempt to become more active as this may help with his collateralization and  decrease in symptoms.  We will have the patient return in 3 months to reevaluate symptoms as well as if he begins to have any worsening symptoms he is advised to contact our office for further evaluation. - VAS [place] ABI WITH/WO TBI  2. Tobacco abuse The patient continues  to remain abstinent  3. Centrilobular emphysema (HCC) Continue pulmonary medications and aerosols as already ordered, these medications have been reviewed and there are no changes at this time.    Current Outpatient Medications on File Prior to Visit  Medication Sig Dispense Refill   acetaminophen (TYLENOL) 500 MG tablet Take 500 mg by mouth every 6 (six) hours as needed.     ASPIRIN LOW DOSE 81 MG tablet TAKE 1 TABLET BY MOUTH EVERY DAY 90 tablet 3   cyanocobalamin (VITAMIN B12) 500 MCG tablet Take 500 mcg by mouth daily.     doxylamine, Sleep, (UNISOM) 25 MG tablet Take 25 mg by mouth at bedtime as needed.     ELIQUIS 5 MG TABS tablet TAKE 1 TABLET BY MOUTH TWICE A DAY 60 tablet 11   esomeprazole (NEXIUM) 20 MG capsule Take 20 mg by mouth daily as needed (heartburn).      ibuprofen (ADVIL) 200 MG tablet Take 200-400 mg by mouth every 6 (six) hours as needed for fever or mild pain.     losartan (COZAAR) 50 MG tablet TAKE 1 TABLET BY MOUTH EVERY DAY 90 tablet 3   Multiple Vitamins-Minerals (MULTIVITAMIN WITH MINERALS) tablet Take 1 tablet by mouth daily.     albuterol (VENTOLIN HFA) 108 (90 Base) MCG/ACT inhaler Inhale 2 puffs into the lungs every 6 (six) hours as needed for shortness of breath. (Patient not taking: Reported on 09/20/2021) 8 g 0   diphenhydrAMINE (BENADRYL) 25 MG tablet Take 25 mg by mouth daily as needed for itching.  (Patient not taking: Reported on 10/17/2021)     No current facility-administered medications on file prior to visit.    There are no Patient Instructions on file for this visit. No follow-ups on file.   Kris Hartmann, NP

## 2022-06-21 ENCOUNTER — Encounter: Payer: Self-pay | Admitting: Primary Care

## 2022-06-21 ENCOUNTER — Other Ambulatory Visit: Payer: Self-pay | Admitting: Primary Care

## 2022-06-21 ENCOUNTER — Ambulatory Visit (INDEPENDENT_AMBULATORY_CARE_PROVIDER_SITE_OTHER): Payer: Medicare Other | Admitting: Primary Care

## 2022-06-21 VITALS — BP 144/82 | HR 82 | Temp 97.3°F | Ht 76.0 in | Wt 222.5 lb

## 2022-06-21 DIAGNOSIS — R7989 Other specified abnormal findings of blood chemistry: Secondary | ICD-10-CM | POA: Diagnosis not present

## 2022-06-21 DIAGNOSIS — K219 Gastro-esophageal reflux disease without esophagitis: Secondary | ICD-10-CM | POA: Diagnosis not present

## 2022-06-21 DIAGNOSIS — Z720 Tobacco use: Secondary | ICD-10-CM

## 2022-06-21 DIAGNOSIS — E785 Hyperlipidemia, unspecified: Secondary | ICD-10-CM | POA: Diagnosis not present

## 2022-06-21 DIAGNOSIS — R7303 Prediabetes: Secondary | ICD-10-CM

## 2022-06-21 DIAGNOSIS — I10 Essential (primary) hypertension: Secondary | ICD-10-CM

## 2022-06-21 DIAGNOSIS — I6523 Occlusion and stenosis of bilateral carotid arteries: Secondary | ICD-10-CM | POA: Diagnosis not present

## 2022-06-21 DIAGNOSIS — Z125 Encounter for screening for malignant neoplasm of prostate: Secondary | ICD-10-CM | POA: Diagnosis not present

## 2022-06-21 DIAGNOSIS — I70212 Atherosclerosis of native arteries of extremities with intermittent claudication, left leg: Secondary | ICD-10-CM | POA: Diagnosis not present

## 2022-06-21 DIAGNOSIS — R5383 Other fatigue: Secondary | ICD-10-CM | POA: Diagnosis not present

## 2022-06-21 DIAGNOSIS — J432 Centrilobular emphysema: Secondary | ICD-10-CM

## 2022-06-21 DIAGNOSIS — N289 Disorder of kidney and ureter, unspecified: Secondary | ICD-10-CM

## 2022-06-21 LAB — COMPREHENSIVE METABOLIC PANEL
ALT: 14 U/L (ref 0–53)
AST: 15 U/L (ref 0–37)
Albumin: 4.1 g/dL (ref 3.5–5.2)
Alkaline Phosphatase: 104 U/L (ref 39–117)
BUN: 14 mg/dL (ref 6–23)
CO2: 33 mEq/L — ABNORMAL HIGH (ref 19–32)
Calcium: 9.4 mg/dL (ref 8.4–10.5)
Chloride: 104 mEq/L (ref 96–112)
Creatinine, Ser: 1.43 mg/dL (ref 0.40–1.50)
GFR: 48.65 mL/min — ABNORMAL LOW (ref >60.00)
Glucose, Bld: 100 mg/dL — ABNORMAL HIGH (ref 70–99)
Potassium: 5.1 mEq/L (ref 3.5–5.1)
Sodium: 142 mEq/L (ref 135–145)
Total Bilirubin: 0.5 mg/dL (ref 0.2–1.2)
Total Protein: 6.6 g/dL (ref 6.0–8.3)

## 2022-06-21 LAB — LDL CHOLESTEROL, DIRECT: Direct LDL: 161 mg/dL

## 2022-06-21 LAB — LIPID PANEL
Cholesterol: 244 mg/dL — ABNORMAL HIGH (ref 0–200)
HDL: 42 mg/dL (ref >39.00)
NonHDL: 202.24
Total CHOL/HDL Ratio: 6
Triglycerides: 228 mg/dL — ABNORMAL HIGH (ref 0.0–149.0)
VLDL: 45.6 mg/dL — ABNORMAL HIGH (ref 0.0–40.0)

## 2022-06-21 LAB — CBC
HCT: 42.2 % (ref 39.0–52.0)
Hemoglobin: 14.3 g/dL (ref 13.0–17.0)
MCHC: 33.9 g/dL (ref 30.0–36.0)
MCV: 92.1 fl (ref 78.0–100.0)
Platelets: 282 10*3/uL (ref 150.0–400.0)
RBC: 4.58 Mil/uL (ref 4.22–5.81)
RDW: 13.9 % (ref 11.5–15.5)
WBC: 9 10*3/uL (ref 4.0–10.5)

## 2022-06-21 LAB — HEMOGLOBIN A1C: Hgb A1c MFr Bld: 6.2 % (ref 4.6–6.5)

## 2022-06-21 LAB — TSH: TSH: 1.06 u[IU]/mL (ref 0.35–5.50)

## 2022-06-21 LAB — VITAMIN B12: Vitamin B-12: 763 pg/mL (ref 211–911)

## 2022-06-21 LAB — PSA, MEDICARE: PSA: 1.4 ng/ml (ref 0.10–4.00)

## 2022-06-21 NOTE — Progress Notes (Signed)
Subjective:    Patient ID: Nathaniel [name], male    DOB: [birth year], 71 y.o.   MRN: [id number]  HPI  Nathaniel [name] is a very pleasant 71 y.o. male with a history of atherosclerosis of lower extremity, carotid artery stenosis, emphysema, hyperlipidemia, tobacco abuse, prediabetes, fatigue who presents today for chronic conditions.  1) Essential Hypertension: Currently managed on losartan 50 mg daily. He checks his BP at home which runs 140's/60's. He denies headaches, chest pain, dizziness.   BP Readings from Last 3 Encounters:  06/21/22 (!) 144/82  06/04/22 (!) 133/91  10/17/21 (!) 150/92    2) Atherosclerosis of Lower Extremity/Hyperlipidemia/PAD: Following with vascular services.  Last visit was on 06/04/2022 for follow-up from recent imaging to the peripheral lower extremity arteries.  ABIs that day revealed 1.10 on the right and 0.47 on the left. There was a new note of popliteal occlusion.  He continues to experience lower extremity pain with walking from the bilateral thighs to the ankles. Improved with rest. Also with constant calf pain with ambulation and applied pressure. He may need surgery in the near future.   He has become more immobile given his lower extremity pain.   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 He denies rectal bleeding.   3) COPD: Currently managed on Symbicort 160-4.5 mg, 2 puffs BID. He is only using the inhaler once daily due to infections that occur in his tongue despite rinsing his mouth. He has noticed improvement overall in breathing with Symbicort. He has not used albuterol inhaler in >1 year. He denies increased SOB, chest tightness, chest pain.   4) Low Vitamin B12: Chronic history of fatigue. History of low B12 in 2019. Currently managed on 2500 mcg of oral B12 daily. He has been taking B12 supplement since June 2023 and has noticed some improvement in fatigue.   Review of Systems  Constitutional:   Positive for fatigue.  Respiratory:         Improved SOB  Cardiovascular:  Negative for chest pain.  Gastrointestinal:  Negative for constipation and diarrhea.  Neurological:  Negative for headaches.  Psychiatric/Behavioral:  The patient is not nervous/anxious.          Past Medical History:  Diagnosis Date   Cataract    Chicken pox    GERD (gastroesophageal reflux disease)    Hyperlipidemia    PAD (peripheral artery disease) (HCC)    Pain in both lower extremities 11/23/2016   Peripheral vascular disease (HCC)    Pre-diabetes    Seizures (HCC)    None since age 1    Social History   Socioeconomic History   Marital status: Married    Spouse name: Not on file   Number of children: Not on file   Years of education: Not on file   Highest education level: Not on file  Occupational History   Not on file  Tobacco Use   Smoking status: Former    Packs/day: 1.00    Years: 50.00    Total pack years: 50.00    Types: Cigarettes    Quit date: 11/21/2019    Years since quitting: 2.5   Smokeless tobacco: Never  Vaping Use   Vaping Use: Never used  Substance and Sexual Activity   Alcohol use: Not Currently    Alcohol/week: 0.0 standard drinks of alcohol   Drug use: No   Sexual activity: Not on file  Other Topics Concern  Not on file  Social History Narrative   Retired.   Stays busy with household work, Surveyor, mining.   Married for 3 years.   Grown children.   Enjoys reading.    Social Determinants of Health   Financial Resource Strain: Low Risk  (09/20/2021)   Overall Financial Resource Strain (CARDIA)    Difficulty of Paying Living Expenses: Not hard at all  Food Insecurity: No Food Insecurity (09/20/2021)   Hunger Vital Sign    Worried About Running Out of Food in the Last Year: Never true    Ran Out of Food in the Last Year: Never true  Transportation Needs: No Transportation Needs (09/20/2021)   PRAPARE - Hydrologist (Medical): No     Lack of Transportation (Non-Medical): No  Physical Activity: Inactive (09/20/2021)   Exercise Vital Sign    Days of Exercise per Week: 0 days    Minutes of Exercise per Session: 0 min  Stress: No Stress Concern Present (09/20/2021)   [place]    Feeling of Stress : Not at all  Social Connections: Moderately Integrated (09/20/2021)   Social Connection and Isolation Panel [NHANES]    Frequency of Communication with Friends and Family: Once a week    Frequency of Social Gatherings with Friends and Family: Twice a week    Attends Religious Services: Never    Marine scientist or Organizations: Yes    Attends Music therapist: More than 4 times per year    Marital Status: Married  Human resources officer Violence: Not At Risk (09/20/2021)   Humiliation, Afraid, Rape, and Kick questionnaire    Fear of Current or Ex-Partner: No    Emotionally Abused: No    Physically Abused: No    Sexually Abused: No    Past Surgical History:  Procedure Laterality Date   BACK SURGERY     COLONOSCOPY WITH PROPOFOL N/A 02/07/2021   Procedure: COLONOSCOPY WITH PROPOFOL;  Surgeon: Jonathon Bellows, MD;  Location: [facility] ENDOSCOPY;  Service: Gastroenterology;  Laterality: N/A;   LAMINECTOMY  1987   LOWER EXTREMITY ANGIOGRAPHY Left 05/02/2017   Procedure: Lower Extremity Angiography;  Surgeon: Algernon Huxley, MD;  Location: [place] [facility];  Service: Cardiovascular;  Laterality: Left;   LOWER EXTREMITY ANGIOGRAPHY Left 11/16/2019   Procedure: LOWER EXTREMITY ANGIOGRAPHY;  Surgeon: Algernon Huxley, MD;  Location: [place] [facility];  Service: Cardiovascular;  Laterality: Left;   LOWER EXTREMITY ANGIOGRAPHY Left 04/11/2020   Procedure: LOWER EXTREMITY ANGIOGRAPHY;  Surgeon: Algernon Huxley, MD;  Location: [place] [facility];  Service: Cardiovascular;  Laterality: Left;   LOWER EXTREMITY ANGIOGRAPHY Left 04/12/2020   Procedure: Lower Extremity  Angiography;  Surgeon: Algernon Huxley, MD;  Location: [place] [facility];  Service: Cardiovascular;  Laterality: Left;   LOWER EXTREMITY ANGIOGRAPHY Left 08/30/2021   Procedure: Lower Extremity Angiography;  Surgeon: Algernon Huxley, MD;  Location: [place] [facility];  Service: Cardiovascular;  Laterality: Left;   LOWER EXTREMITY INTERVENTION Left 08/31/2021   Procedure: LOWER EXTREMITY INTERVENTION;  Surgeon: Algernon Huxley, MD;  Location: [place] [facility];  Service: Cardiovascular;  Laterality: Left;   TONSILLECTOMY      Family History  Problem Relation Age of Onset   Heart disease Mother        Arrthymia    Stroke Father        Deceased   Dementia Father  Heart disease Brother        Myocardial infarction   Colon cancer Neg Hx     Allergies  Allergen Reactions   Fentanyl Other (See Comments)    chills   Prozac [Fluoxetine Hcl] Other (See Comments)    Extreme depression   Statins Other (See Comments)    Flu like symptoms    Current Outpatient Medications on File Prior to Visit  Medication Sig Dispense Refill   acetaminophen (TYLENOL) 500 MG tablet Take 500 mg by mouth every 6 (six) hours as needed.     albuterol (VENTOLIN HFA) 108 (90 Base) MCG/ACT inhaler Inhale 2 puffs into the lungs every 6 (six) hours as needed for shortness of breath. 8 g 0   ASPIRIN LOW DOSE 81 MG tablet TAKE 1 TABLET BY MOUTH EVERY DAY 90 tablet 3   Cyanocobalamin 2500 MCG TABS Take 1 tablet by mouth daily.     doxylamine, Sleep, (UNISOM) 25 MG tablet Take 25 mg by mouth at bedtime as needed.     ELIQUIS 5 MG TABS tablet TAKE 1 TABLET BY MOUTH TWICE A DAY 60 tablet 11   esomeprazole (NEXIUM) 20 MG capsule Take 20 mg by mouth daily as needed (heartburn).      ibuprofen (ADVIL) 200 MG tablet Take 200-400 mg by mouth every 6 (six) hours as needed for fever or mild pain.     losartan (COZAAR) 50 MG tablet TAKE 1 TABLET BY MOUTH EVERY DAY 90 tablet 3   Multiple Vitamins-Minerals (MULTIVITAMIN WITH  MINERALS) tablet Take 1 tablet by mouth daily.     SYMBICORT 160-4.5 MCG/ACT inhaler TAKE 2 PUFFS BY MOUTH TWICE A DAY 10.2 each 0   No current facility-administered medications on file prior to visit.    BP (!) 144/82 (BP Location: Right Arm, Patient Position: Sitting, Cuff Size: Normal)   Pulse 82   Temp (!) 97.3 F (36.3 C) (Temporal)   Ht '6\' 4"'$  (1.93 m)   Wt 222 lb 8 oz (100.9 kg)   SpO2 97%   BMI 27.08 kg/m  Objective:   Physical Exam Cardiovascular:     Rate and Rhythm: Normal rate and regular rhythm.  Pulmonary:     Effort: Pulmonary effort is normal.     Breath sounds: Normal breath sounds. No wheezing or rales.  Musculoskeletal:     Cervical back: Neck supple.  Skin:    General: Skin is warm and dry.  Neurological:     Mental Status: He is alert and oriented to person, place, and time.  Psychiatric:        Mood and Affect: Mood normal.           Assessment & Plan:   Problem List Items Addressed This Visit       Cardiovascular and Mediastinum   Atherosclerosis of native arteries of extremity with intermittent claudication [facility])    Following with vascular services and cardiology. Office notes reviewed from vascular services from November 2023.  Continue aspirin 81 mg daily, Eliquis 5 mg twice daily.      Essential hypertension    Above goal today, has not had medications today.  Continue losartan 50 mg daily. CMP pending.      Relevant Orders   Comprehensive metabolic panel   CBC   TSH     Respiratory   Centrilobular emphysema (HCC)    Overall appears controlled.  Continue Symbicort 160-4.5 mcg, 2 puffs daily. Continue albuterol inhaler as needed.  Digestive   GERD (gastroesophageal reflux disease)    Controlled. Continue Nexium 20 mg daily.        Other   Tobacco abuse    Lung cancer screening up-to-date.        Hyperlipidemia - Primary    Remains statin and Zetia intolerant.  Repeat lipid panel pending. Continue  aspirin 81 mg daily.      Relevant Orders   Lipid panel   Comprehensive metabolic panel   Fatigue    Slight improvement.  Continue vitamin B12 2500 mcg supplements daily.  Repeat vitamin B12 level pending. Add TSH      Prediabetes    Discussed the importance of a healthy diet and regular exercise in order for weight loss, and to reduce the risk of further co-morbidity.  Repeat A1c pending.      Relevant Orders   Hemoglobin A1c   Other Visit Diagnoses     Low vitamin B12 level       Relevant Orders   Vitamin B12   Screening for prostate cancer       Relevant Orders   PSA, Medicare          Pleas Koch, NP

## 2022-06-21 NOTE — Assessment & Plan Note (Signed)
Controlled.  Continue Nexium 20 mg daily.  

## 2022-06-21 NOTE — Assessment & Plan Note (Signed)
Lung cancer screening up-to-date.

## 2022-06-21 NOTE — Assessment & Plan Note (Signed)
Overall appears controlled.  Continue Symbicort 160-4.5 mcg, 2 puffs daily. Continue albuterol inhaler as needed.

## 2022-06-21 NOTE — Assessment & Plan Note (Signed)
Discussed the importance of a healthy diet and regular exercise in order for weight loss, and to reduce the risk of further co-morbidity.  Repeat A1c pending.

## 2022-06-21 NOTE — Assessment & Plan Note (Signed)
Following with vascular services and cardiology. Office notes reviewed from vascular services from November 2023.  Continue aspirin 81 mg daily, Eliquis 5 mg twice daily.

## 2022-06-21 NOTE — Assessment & Plan Note (Signed)
Slight improvement.  Continue vitamin B12 2500 mcg supplements daily.  Repeat vitamin B12 level pending. Add TSH

## 2022-06-21 NOTE — Assessment & Plan Note (Signed)
Above goal today, has not had medications today.  Continue losartan 50 mg daily. CMP pending.

## 2022-06-21 NOTE — Assessment & Plan Note (Signed)
Remains statin and Zetia intolerant.  Repeat lipid panel pending. Continue aspirin 81 mg daily.

## 2022-07-03 DIAGNOSIS — M9903 Segmental and somatic dysfunction of lumbar region: Secondary | ICD-10-CM | POA: Diagnosis not present

## 2022-07-03 DIAGNOSIS — M62830 Muscle spasm of back: Secondary | ICD-10-CM | POA: Diagnosis not present

## 2022-07-03 DIAGNOSIS — M5136 Other intervertebral disc degeneration, lumbar region: Secondary | ICD-10-CM | POA: Diagnosis not present

## 2022-07-03 DIAGNOSIS — M9905 Segmental and somatic dysfunction of pelvic region: Secondary | ICD-10-CM | POA: Diagnosis not present

## 2022-07-26 ENCOUNTER — Other Ambulatory Visit (INDEPENDENT_AMBULATORY_CARE_PROVIDER_SITE_OTHER): Payer: Medicare Other

## 2022-07-26 DIAGNOSIS — N289 Disorder of kidney and ureter, unspecified: Secondary | ICD-10-CM | POA: Diagnosis not present

## 2022-07-26 LAB — BASIC METABOLIC PANEL
BUN: 18 mg/dL (ref 6–23)
CO2: 31 mEq/L (ref 19–32)
Calcium: 9.5 mg/dL (ref 8.4–10.5)
Chloride: 102 mEq/L (ref 96–112)
Creatinine, Ser: 1.24 mg/dL (ref 0.40–1.50)
GFR: 57.68 mL/min — ABNORMAL LOW (ref >60.00)
Glucose, Bld: 90 mg/dL (ref 70–99)
Potassium: 4.3 mEq/L (ref 3.5–5.1)
Sodium: 140 mEq/L (ref 135–145)

## 2022-07-31 ENCOUNTER — Encounter (INDEPENDENT_AMBULATORY_CARE_PROVIDER_SITE_OTHER): Payer: Self-pay | Admitting: Vascular Surgery

## 2022-07-31 ENCOUNTER — Ambulatory Visit (INDEPENDENT_AMBULATORY_CARE_PROVIDER_SITE_OTHER): Payer: Medicare Other | Admitting: Vascular Surgery

## 2022-07-31 VITALS — BP 168/90 | HR 73 | Resp 17 | Ht 76.0 in | Wt 224.0 lb

## 2022-07-31 DIAGNOSIS — I6523 Occlusion and stenosis of bilateral carotid arteries: Secondary | ICD-10-CM | POA: Diagnosis not present

## 2022-07-31 DIAGNOSIS — M5136 Other intervertebral disc degeneration, lumbar region: Secondary | ICD-10-CM | POA: Diagnosis not present

## 2022-07-31 DIAGNOSIS — M9905 Segmental and somatic dysfunction of pelvic region: Secondary | ICD-10-CM | POA: Diagnosis not present

## 2022-07-31 DIAGNOSIS — M62830 Muscle spasm of back: Secondary | ICD-10-CM | POA: Diagnosis not present

## 2022-07-31 DIAGNOSIS — I70212 Atherosclerosis of native arteries of extremities with intermittent claudication, left leg: Secondary | ICD-10-CM

## 2022-07-31 DIAGNOSIS — M9903 Segmental and somatic dysfunction of lumbar region: Secondary | ICD-10-CM | POA: Diagnosis not present

## 2022-07-31 DIAGNOSIS — I10 Essential (primary) hypertension: Secondary | ICD-10-CM

## 2022-07-31 NOTE — Assessment & Plan Note (Signed)
Claudication symptoms are roughly stable and may be slightly improved since his last visit.  Going to continue her current regimen of exercise and medications.  He will remain abstinent from tobacco.  Will reassess in about 3 months with ABIs or sooner if problems develop in the.

## 2022-07-31 NOTE — Assessment & Plan Note (Signed)
blood pressure control important in reducing the progression of atherosclerotic disease. On appropriate oral medications.  

## 2022-07-31 NOTE — Progress Notes (Signed)
MRN : [id number]  Nathaniel [name] is a 72 y.o. (01-27-1951) male who presents with chief complaint of  Chief Complaint  Patient presents with   Follow-up    8 weeks no studies  .  History of Present Illness: Patient returns today in follow up of his PAD with patient.  He continues to have left lower extremity claudication symptoms that are roughly stable from his last visit a couple of months ago.  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  He does not have ulceration.  He does not have rest pain.  He describes swelling which has been on and off but present most of the time.  No fevers or chills or signs of systemic infection.  Tolerating medications.  Continues to be abstinent from tobacco.  Current Outpatient Medications  Medication Sig Dispense Refill   acetaminophen (TYLENOL) 500 MG tablet Take 500 mg by mouth every 6 (six) hours as needed.     albuterol (VENTOLIN HFA) 108 (90 Base) MCG/ACT inhaler Inhale 2 puffs into the lungs every 6 (six) hours as needed for shortness of breath. 8 g 0   ASPIRIN LOW DOSE 81 MG tablet TAKE 1 TABLET BY MOUTH EVERY DAY 90 tablet 3   Cyanocobalamin 2500 MCG TABS Take 1 tablet by mouth daily.     doxylamine, Sleep, (UNISOM) 25 MG tablet Take 25 mg by mouth at bedtime as needed.     ELIQUIS 5 MG TABS tablet TAKE 1 TABLET BY MOUTH TWICE A DAY 60 tablet 11   esomeprazole (NEXIUM) 20 MG capsule Take 20 mg by mouth daily as needed (heartburn).      ibuprofen (ADVIL) 200 MG tablet Take 200-400 mg by mouth every 6 (six) hours as needed for fever or mild pain.     losartan (COZAAR) 50 MG tablet TAKE 1 TABLET BY MOUTH EVERY DAY 90 tablet 3   Multiple Vitamins-Minerals (MULTIVITAMIN WITH MINERALS) tablet Take 1 tablet by mouth daily.     SYMBICORT 160-4.5 MCG/ACT inhaler TAKE 2 PUFFS BY MOUTH TWICE A DAY 10.2 each 0   No current facility-administered medications for this visit.    Past Medical History:  Diagnosis  Date   Cataract    Chicken pox    GERD (gastroesophageal reflux disease)    Hyperlipidemia    PAD (peripheral artery disease) (HCC)    Pain in both lower extremities 11/23/2016   Peripheral vascular disease (HCC)    Pre-diabetes    Seizures (HCC)    None since age 24    Past Surgical History:  Procedure Laterality Date   BACK SURGERY     COLONOSCOPY WITH PROPOFOL N/A 02/07/2021   Procedure: COLONOSCOPY WITH PROPOFOL;  Surgeon: Jonathon Bellows, MD;  Location: [facility] ENDOSCOPY;  Service: Gastroenterology;  Laterality: N/A;   LAMINECTOMY  1987   LOWER EXTREMITY ANGIOGRAPHY Left 05/02/2017   Procedure: Lower Extremity Angiography;  Surgeon: Algernon Huxley, MD;  Location: [place] [facility];  Service: Cardiovascular;  Laterality: Left;   LOWER EXTREMITY ANGIOGRAPHY Left 11/16/2019   Procedure: LOWER EXTREMITY ANGIOGRAPHY;  Surgeon: Algernon Huxley, MD;  Location: [place] [facility];  Service: Cardiovascular;  Laterality: Left;   LOWER EXTREMITY ANGIOGRAPHY Left 04/11/2020   Procedure: LOWER EXTREMITY ANGIOGRAPHY;  Surgeon: Algernon Huxley, MD;  Location: [place] [facility];  Service: Cardiovascular;  Laterality: Left;   LOWER EXTREMITY ANGIOGRAPHY Left 04/12/2020   Procedure: Lower Extremity Angiography;  Surgeon: Algernon Huxley,  MD;  Location: [place] [facility];  Service: Cardiovascular;  Laterality: Left;   LOWER EXTREMITY ANGIOGRAPHY Left 08/30/2021   Procedure: Lower Extremity Angiography;  Surgeon: Algernon Huxley, MD;  Location: [place] [facility];  Service: Cardiovascular;  Laterality: Left;   LOWER EXTREMITY INTERVENTION Left 08/31/2021   Procedure: LOWER EXTREMITY INTERVENTION;  Surgeon: Algernon Huxley, MD;  Location: [place] [facility];  Service: Cardiovascular;  Laterality: Left;   TONSILLECTOMY       Social History   Tobacco Use   Smoking status: Former    Packs/day: 1.00    Years: 50.00    Total pack years: 50.00    Types: Cigarettes    Quit date: 11/21/2019    Years since quitting:  2.6   Smokeless tobacco: Never  Vaping Use   Vaping Use: Never used  Substance Use Topics   Alcohol use: Not Currently    Alcohol/week: 0.0 standard drinks of alcohol   Drug use: No       Family History  Problem Relation Age of Onset   Heart disease Mother        Arrthymia    Stroke Father        Deceased   Dementia Father    Heart disease Brother        Myocardial infarction   Colon cancer Neg Hx      Allergies  Allergen Reactions   Fentanyl Other (See Comments)    chills   Prozac [Fluoxetine Hcl] Other (See Comments)    Extreme depression   Statins Other (See Comments)    Flu like symptoms     REVIEW OF SYSTEMS (Negative unless checked)  Constitutional: '[]'$ Weight loss  '[]'$ Fever  '[]'$ Chills Cardiac: '[]'$ Chest pain   '[]'$ Chest pressure   '[]'$ Palpitations   '[]'$ Shortness of breath when laying flat   '[]'$ Shortness of breath at rest   '[]'$ Shortness of breath with exertion. Vascular:  '[x]'$ Pain in legs with walking   '[]'$ Pain in legs at rest   '[]'$ Pain in legs when laying flat   '[x]'$ Claudication   '[]'$ Pain in feet when walking  '[]'$ Pain in feet at rest  '[]'$ Pain in feet when laying flat   '[]'$ History of DVT   '[]'$ Phlebitis   '[]'$ Swelling in legs   '[]'$ Varicose veins   '[]'$ Non-healing ulcers Pulmonary:   '[]'$ Uses home oxygen   '[]'$ Productive cough   '[]'$ Hemoptysis   '[]'$ Wheeze  '[]'$ COPD   '[]'$ Asthma Neurologic:  '[]'$ Dizziness  '[]'$ Blackouts   '[x]'$ Seizures   '[]'$ History of stroke   '[]'$ History of TIA  '[]'$ Aphasia   '[]'$ Temporary blindness   '[]'$ Dysphagia   '[]'$ Weakness or numbness in arms   '[]'$ Weakness or numbness in legs Musculoskeletal:  '[x]'$ Arthritis   '[]'$ Joint swelling   '[]'$ Joint pain   '[]'$ Low back pain Hematologic:  '[]'$ Easy bruising  '[]'$ Easy bleeding   '[]'$ Hypercoagulable state   '[]'$ Anemic   Gastrointestinal:  '[]'$ Blood in stool   '[]'$ Vomiting blood  '[x]'$ Gastroesophageal reflux/heartburn   '[]'$ Abdominal pain Genitourinary:  '[]'$ Chronic kidney disease   '[]'$ Difficult urination  '[]'$ Frequent urination  '[]'$ Burning with urination   '[]'$ Hematuria Skin:  '[]'$ Rashes    '[]'$ Ulcers   '[]'$ Wounds Psychological:  '[]'$ History of anxiety   '[]'$  History of major depression.  Physical Examination  BP (!) 168/90 (BP Location: Right Arm)   Pulse 73   Resp 17   Ht '6\' 4"'$  (1.93 m)   Wt 224 lb (101.6 kg)   BMI 27.27 kg/m  Gen:  WD/WN, NAD Head: /AT, No temporalis wasting. Ear/Nose/Throat: Hearing grossly intact, nares w/o erythema  or drainage Eyes: Conjunctiva clear. Sclera non-icteric Neck: Supple.  Trachea midline Pulmonary:  Good air movement, no use of accessory muscles.  Cardiac: RRR, no JVD Vascular:  Vessel Right Left  Radial Palpable Palpable           Musculoskeletal: M/S 5/5 throughout.  No deformity or atrophy. 1+ BLE edema. Neurologic: Sensation grossly intact in extremities.  Symmetrical.  Speech is fluent.  Psychiatric: Judgment intact, Mood & affect appropriate for pt's clinical situation. Dermatologic: No rashes or ulcers noted.  No cellulitis or open wounds.      Labs Recent Results (from the past 2160 hour(s))  Lipid panel     Status: Abnormal   Collection Time: 06/21/22 10:04 AM  Result Value Ref Range   Cholesterol 244 (H) 0 - 200 mg/dL    Comment: ATP III Classification       Desirable:  < 200 mg/dL               Borderline High:  200 - 239 mg/dL          High:  > = 240 mg/dL   Triglycerides 228.0 (H) 0.0 - 149.0 mg/dL    Comment: Normal:  <150 mg/dLBorderline High:  150 - 199 mg/dL   HDL 42.00 >39.00 mg/dL   VLDL 45.6 (H) 0.0 - 40.0 mg/dL   Total CHOL/HDL Ratio 6     Comment:                Men          Women1/2 Average Risk     3.4          3.3Average Risk          5.0          4.42X Average Risk          9.6          7.13X Average Risk          15.0          11.0                       NonHDL 202.24     Comment: NOTE:  Non-HDL goal should be 30 mg/dL higher than patient's LDL goal (i.e. LDL goal of < 70 mg/dL, would have non-HDL goal of < 100 mg/dL)  Vitamin B12     Status: None   Collection Time: 06/21/22 10:04 AM  Result Value  Ref Range   Vitamin B-12 763 211 - 911 pg/mL  Hemoglobin A1c     Status: None   Collection Time: 06/21/22 10:04 AM  Result Value Ref Range   Hgb A1c MFr Bld 6.2 4.6 - 6.5 %    Comment: Glycemic Control Guidelines for People with Diabetes:Non Diabetic:  <6%Goal of Therapy: <7%Additional Action Suggested:  >8%   Comprehensive metabolic panel     Status: Abnormal   Collection Time: 06/21/22 10:04 AM  Result Value Ref Range   Sodium 142 135 - 145 mEq/L   Potassium 5.1 3.5 - 5.1 mEq/L   Chloride 104 96 - 112 mEq/L   CO2 33 (H) 19 - 32 mEq/L   Glucose, Bld 100 (H) 70 - 99 mg/dL   BUN 14 6 - 23 mg/dL   Creatinine, Ser 1.43 0.40 - 1.50 mg/dL   Total Bilirubin 0.5 0.2 - 1.2 mg/dL   Alkaline Phosphatase 104 39 - 117 U/L   AST 15 0 - 37 U/L  ALT 14 0 - 53 U/L   Total Protein 6.6 6.0 - 8.3 g/dL   Albumin 4.1 3.5 - 5.2 g/dL   GFR 48.65 (L) >60.00 mL/min    Comment: Calculated using the CKD-EPI Creatinine Equation (2021)   Calcium 9.4 8.4 - 10.5 mg/dL  CBC     Status: None   Collection Time: 06/21/22 10:04 AM  Result Value Ref Range   WBC 9.0 4.0 - 10.5 K/uL   RBC 4.58 4.22 - 5.81 Mil/uL   Platelets 282.0 Repeated and verified X2. 150.0 - 400.0 K/uL   Hemoglobin 14.3 13.0 - 17.0 g/dL   HCT 42.2 39.0 - 52.0 %   MCV 92.1 78.0 - 100.0 fl   MCHC 33.9 30.0 - 36.0 g/dL   RDW 13.9 11.5 - 15.5 %  PSA, Medicare     Status: None   Collection Time: 06/21/22 10:04 AM  Result Value Ref Range   PSA 1.40 0.10 - 4.00 ng/ml    Comment: Test performed using Access Hybritech PSA Assay, a parmagnetic partical, chemiluminecent immunoassay.  TSH     Status: None   Collection Time: 06/21/22 10:04 AM  Result Value Ref Range   TSH 1.06 0.35 - 5.50 uIU/mL  LDL cholesterol, direct     Status: None   Collection Time: 06/21/22 10:04 AM  Result Value Ref Range   Direct LDL 161.0 mg/dL    Comment: Optimal:  <100 mg/dLNear or Above Optimal:  100-129 mg/dLBorderline High:  130-159 mg/dLHigh:  160-189 mg/dLVery  High:  >190 mg/dL  Basic metabolic panel     Status: Abnormal   Collection Time: 07/26/22  8:35 AM  Result Value Ref Range   Sodium 140 135 - 145 mEq/L   Potassium 4.3 3.5 - 5.1 mEq/L   Chloride 102 96 - 112 mEq/L   CO2 31 19 - 32 mEq/L   Glucose, Bld 90 70 - 99 mg/dL   BUN 18 6 - 23 mg/dL   Creatinine, Ser 1.24 0.40 - 1.50 mg/dL   GFR 57.68 (L) >60.00 mL/min    Comment: Calculated using the CKD-EPI Creatinine Equation (2021)   Calcium 9.5 8.4 - 10.5 mg/dL    Radiology No results found.  Assessment/Plan Hyperlipidemia lipid control important in reducing the progression of atherosclerotic disease. Continue statin therapy     Stenosis of carotid artery Duplex last year showed 1 to 39% stenosis bilaterally without significant progression previous studies.  No change in medical regimen.  Recheck in 1 year.  Essential hypertension blood pressure control important in reducing the progression of atherosclerotic disease. On appropriate oral medications.   Atherosclerosis of native arteries of extremity with intermittent claudication (HCC) Claudication symptoms are roughly stable and may be slightly improved since his last visit.  Going to continue her current regimen of exercise and medications.  He will remain abstinent from tobacco.  Will reassess in about 3 months with ABIs or sooner if problems develop in the.    Leotis Pain, MD  07/31/2022 3:19 PM    This note was created with Dragon medical transcription system.  Any errors from dictation are purely unintentional

## 2022-08-28 DIAGNOSIS — M62830 Muscle spasm of back: Secondary | ICD-10-CM | POA: Diagnosis not present

## 2022-08-28 DIAGNOSIS — M9905 Segmental and somatic dysfunction of pelvic region: Secondary | ICD-10-CM | POA: Diagnosis not present

## 2022-08-28 DIAGNOSIS — M9903 Segmental and somatic dysfunction of lumbar region: Secondary | ICD-10-CM | POA: Diagnosis not present

## 2022-08-28 DIAGNOSIS — M5136 Other intervertebral disc degeneration, lumbar region: Secondary | ICD-10-CM | POA: Diagnosis not present

## 2022-09-25 DIAGNOSIS — M9905 Segmental and somatic dysfunction of pelvic region: Secondary | ICD-10-CM | POA: Diagnosis not present

## 2022-09-25 DIAGNOSIS — M5136 Other intervertebral disc degeneration, lumbar region: Secondary | ICD-10-CM | POA: Diagnosis not present

## 2022-09-25 DIAGNOSIS — M62830 Muscle spasm of back: Secondary | ICD-10-CM | POA: Diagnosis not present

## 2022-09-25 DIAGNOSIS — M9903 Segmental and somatic dysfunction of lumbar region: Secondary | ICD-10-CM | POA: Diagnosis not present

## 2022-09-26 ENCOUNTER — Telehealth: Payer: Self-pay | Admitting: Primary Care

## 2022-09-26 NOTE — Telephone Encounter (Signed)
Contacted Martyn Malay to schedule their annual wellness visit. Call back at later date: 10/02/2022  [place]: [phone]

## 2022-10-06 ENCOUNTER — Other Ambulatory Visit: Payer: Self-pay | Admitting: Primary Care

## 2022-10-06 DIAGNOSIS — J432 Centrilobular emphysema: Secondary | ICD-10-CM

## 2022-10-16 ENCOUNTER — Telehealth: Payer: Self-pay | Admitting: Primary Care

## 2022-10-16 NOTE — Telephone Encounter (Signed)
Contacted Nathaniel [name] to schedule their annual wellness visit. Appointment made for 11/07/2022.  [place] Direct Dial: [phone]

## 2022-10-23 DIAGNOSIS — M5136 Other intervertebral disc degeneration, lumbar region: Secondary | ICD-10-CM | POA: Diagnosis not present

## 2022-10-23 DIAGNOSIS — M62830 Muscle spasm of back: Secondary | ICD-10-CM | POA: Diagnosis not present

## 2022-10-23 DIAGNOSIS — M9903 Segmental and somatic dysfunction of lumbar region: Secondary | ICD-10-CM | POA: Diagnosis not present

## 2022-10-23 DIAGNOSIS — M9905 Segmental and somatic dysfunction of pelvic region: Secondary | ICD-10-CM | POA: Diagnosis not present

## 2022-10-26 ENCOUNTER — Other Ambulatory Visit (INDEPENDENT_AMBULATORY_CARE_PROVIDER_SITE_OTHER): Payer: Self-pay | Admitting: Nurse Practitioner

## 2022-10-26 DIAGNOSIS — I70212 Atherosclerosis of native arteries of extremities with intermittent claudication, left leg: Secondary | ICD-10-CM

## 2022-10-29 ENCOUNTER — Ambulatory Visit (INDEPENDENT_AMBULATORY_CARE_PROVIDER_SITE_OTHER): Payer: Medicare Other

## 2022-10-29 ENCOUNTER — Ambulatory Visit (INDEPENDENT_AMBULATORY_CARE_PROVIDER_SITE_OTHER): Payer: Medicare Other | Admitting: Nurse Practitioner

## 2022-10-29 ENCOUNTER — Encounter (INDEPENDENT_AMBULATORY_CARE_PROVIDER_SITE_OTHER): Payer: Self-pay | Admitting: Nurse Practitioner

## 2022-10-29 VITALS — BP 148/84 | HR 74 | Resp 16 | Wt 221.4 lb

## 2022-10-29 DIAGNOSIS — I70212 Atherosclerosis of native arteries of extremities with intermittent claudication, left leg: Secondary | ICD-10-CM

## 2022-10-29 DIAGNOSIS — J432 Centrilobular emphysema: Secondary | ICD-10-CM

## 2022-10-29 DIAGNOSIS — Z720 Tobacco use: Secondary | ICD-10-CM | POA: Diagnosis not present

## 2022-10-29 NOTE — Progress Notes (Signed)
Subjective:    Patient ID: Nathaniel [name], male    DOB: [birth year], 72 y.o.   MRN: [id number] Chief Complaint  Patient presents with   Follow-up    Ultrasound follow up    Nathaniel [name] is a 72 year old male that returns today for evaluation of his peripheral arterial disease.  We had multiple interventions in the left lower extremity with the most recent in February 2023.  He has a known occlusion of his left SFA.  He denies any rest pain like symptoms.  He notes that his claudication symptoms have improved.  Previously he was only able to walk about 10 feet before claudication he can walk up to about 150 feet.  The patient has a right ABI of 1.09 today and the ABI of 0.59 on the left.  This is an improvement from previous studies which showed an ABI of 1.10 on the right and 0.47 on the left.  The patient has triphasic tibial artery waveforms on the right and monophasic waveforms on the left.      Review of Systems  Cardiovascular:        Claudication  Skin:  Negative for wound.  All other systems reviewed and are negative.      Objective:   Physical Exam Vitals reviewed.  HENT:     Head: Normocephalic.  Cardiovascular:     Rate and Rhythm: Normal rate.     Pulses:          Dorsalis pedis pulses are detected w/ Doppler on the left side.       Posterior tibial pulses are detected w/ Doppler on the left side.  Pulmonary:     Effort: Pulmonary effort is normal.  Skin:    General: Skin is warm and dry.  Neurological:     Mental Status: He is alert and oriented to person, place, and time.  Psychiatric:        Mood and Affect: Mood normal.        Behavior: Behavior normal.        Thought Content: Thought content normal.        Judgment: Judgment normal.     BP (!) 148/84 (BP Location: Right Arm)   Pulse 74   Resp 16   Wt 221 lb 6.4 oz (100.4 kg)   BMI 26.95 kg/m   Past Medical History:  Diagnosis Date   Cataract    Chicken pox    GERD (gastroesophageal  reflux disease)    Hyperlipidemia    PAD (peripheral artery disease)    Pain in both lower extremities 11/23/2016   Peripheral vascular disease    Pre-diabetes    Seizures    None since age 72    Social History   Socioeconomic History   Marital status: Married    Spouse name: Not on file   Number of children: Not on file   Years of education: Not on file   Highest education level: Not on file  Occupational History   Not on file  Tobacco Use   Smoking status: Former    Packs/day: 1.00    Years: 50.00    Additional pack years: 0.00    Total pack years: 50.00    Types: Cigarettes    Quit date: 11/21/2019    Years since quitting: 2.9   Smokeless tobacco: Never  Vaping Use   Vaping Use: Never used  Substance and Sexual Activity   Alcohol use: Not Currently    Alcohol/week: 0.0  standard drinks of alcohol   Drug use: No   Sexual activity: Not on file  Other Topics Concern   Not on file  Social History Narrative   Retired.   Stays busy with household work, Programmer, applications.   Married for 3 years.   Grown children.   Enjoys reading.    Social Determinants of Health   Financial Resource Strain: Low Risk  (09/20/2021)   Overall Financial Resource Strain (CARDIA)    Difficulty of Paying Living Expenses: Not hard at all  Food Insecurity: No Food Insecurity (09/20/2021)   Hunger Vital Sign    Worried About Running Out of Food in the Last Year: Never true    Ran Out of Food in the Last Year: Never true  Transportation Needs: No Transportation Needs (09/20/2021)   PRAPARE - Administrator, Civil Service (Medical): No    Lack of Transportation (Non-Medical): No  Physical Activity: Inactive (09/20/2021)   Exercise Vital Sign    Days of Exercise per Week: 0 days    Minutes of Exercise per Session: 0 min  Stress: No Stress Concern Present (09/20/2021)   [employer] of Occupational Health - Occupational Stress Questionnaire    Feeling of Stress : Not at all  Social  Connections: Moderately Integrated (09/20/2021)   Social Connection and Isolation Panel [NHANES]    Frequency of Communication with Friends and Family: Once a week    Frequency of Social Gatherings with Friends and Family: Twice a week    Attends Religious Services: Never    Database administrator or Organizations: Yes    Attends Engineer, structural: More than 4 times per year    Marital Status: Married  Catering manager Violence: Not At Risk (09/20/2021)   Humiliation, Afraid, Rape, and Kick questionnaire    Fear of Current or Ex-Partner: No    Emotionally Abused: No    Physically Abused: No    Sexually Abused: No    Past Surgical History:  Procedure Laterality Date   BACK SURGERY     COLONOSCOPY WITH PROPOFOL N/A 02/07/2021   Procedure: COLONOSCOPY WITH PROPOFOL;  Surgeon: Wyline Mood, MD;  Location: [facility] ENDOSCOPY;  Service: Gastroenterology;  Laterality: N/A;   LAMINECTOMY  1987   LOWER EXTREMITY ANGIOGRAPHY Left 05/02/2017   Procedure: Lower Extremity Angiography;  Surgeon: Annice Needy, MD;  Location: [facility] INVASIVE CV LAB;  Service: Cardiovascular;  Laterality: Left;   LOWER EXTREMITY ANGIOGRAPHY Left 11/16/2019   Procedure: LOWER EXTREMITY ANGIOGRAPHY;  Surgeon: Annice Needy, MD;  Location: [facility] INVASIVE CV LAB;  Service: Cardiovascular;  Laterality: Left;   LOWER EXTREMITY ANGIOGRAPHY Left 04/11/2020   Procedure: LOWER EXTREMITY ANGIOGRAPHY;  Surgeon: Annice Needy, MD;  Location: [facility] INVASIVE CV LAB;  Service: Cardiovascular;  Laterality: Left;   LOWER EXTREMITY ANGIOGRAPHY Left 04/12/2020   Procedure: Lower Extremity Angiography;  Surgeon: Annice Needy, MD;  Location: [facility] INVASIVE CV LAB;  Service: Cardiovascular;  Laterality: Left;   LOWER EXTREMITY ANGIOGRAPHY Left 08/30/2021   Procedure: Lower Extremity Angiography;  Surgeon: Annice Needy, MD;  Location: [facility] INVASIVE CV LAB;  Service: Cardiovascular;  Laterality: Left;   LOWER EXTREMITY INTERVENTION Left 08/31/2021    Procedure: LOWER EXTREMITY INTERVENTION;  Surgeon: Annice Needy, MD;  Location: [facility] INVASIVE CV LAB;  Service: Cardiovascular;  Laterality: Left;   TONSILLECTOMY      Family History  Problem Relation Age of Onset   Heart disease Mother  Arrthymia    Stroke Father        Deceased   Dementia Father    Heart disease Brother        Myocardial infarction   Colon cancer Neg Hx     Allergies  Allergen Reactions   Fentanyl Other (See Comments)    chills   Prozac [Fluoxetine Hcl] Other (See Comments)    Extreme depression   Statins Other (See Comments)    Flu like symptoms       Latest Ref Rng & Units 06/21/2022   10:04 AM 08/31/2021    4:28 PM 08/31/2021   10:36 AM  CBC  WBC 4.0 - 10.5 K/uL 9.0  11.1  13.3   Hemoglobin 13.0 - 17.0 g/dL 16.114.3  09.612.2  04.512.4   Hematocrit 39.0 - 52.0 % 42.2  36.6  37.1   Platelets 150.0 - 400.0 K/uL 282.0 Repeated and verified X2.  213  218       CMP     Component Value Date/Time   NA 140 07/26/2022 0835   NA 144 05/04/2021 1038   K 4.3 07/26/2022 0835   CL 102 07/26/2022 0835   CO2 31 07/26/2022 0835   GLUCOSE 90 07/26/2022 0835   BUN 18 07/26/2022 0835   BUN 10 05/04/2021 1038   CREATININE 1.24 07/26/2022 0835   CALCIUM 9.5 07/26/2022 0835   PROT 6.6 06/21/2022 1004   ALBUMIN 4.1 06/21/2022 1004   AST 15 06/21/2022 1004   ALT 14 06/21/2022 1004   ALKPHOS 104 06/21/2022 1004   BILITOT 0.5 06/21/2022 1004   GFRNONAA >60 08/31/2021 1036   GFRAA >60 04/13/2020 0723     VAS US ABI WITH/WO TBI  Result Date: 06/11/2022  LOWER EXTREMITY DOPPLER STUDY Patient Name:  Shelbie Ammonshomas G Slimp  Date of Exam:   06/04/2022 Medical Rec #: [id number]            Accession #:    [id number] Date of Birth: 11/30/1950             Patient Gender: M Patient Age:   [age] years Exam Location:  [facility] Procedure:      VAS US ABI WITH/WO TBI Referring Phys: Barbara CowerJASON DEW --------------------------------------------------------------------------------   Indications: Claudication, rest pain, and peripheral artery disease.  Vascular Interventions: 05/02/17: Right EIA PTA with left distal SFA-proximal                         popliteal artery stent;                         11/16/2019: Right EIA angioplasty, left SFA/popliteal                         thrombectomy/stent & left PTA angioplasty;                         9/20-21/2021 left pta thrombectomy new stent                          08/30/2021: Aortogram and Selective Lt Lower Extremity                         Angiogram. Catheter directed thrombolytic therapy with 8  mg of TPA to the Left SFA and Popliteal Artery.                         Mechanical thrombectomy to the Left SFA and Popliteal                         Artery with the [place] Rex device. PTA of the Left PTA and                         Tibioperoneal Trunk with 3 mm diameter angioplasty                         balloon. PTA of the Left SFA and Popliteal Artery with 2                         inflations with a 5 mm diameter by 30 cm length Lutonix                         drug coated angioplasty balloon. Placement of a                         thrombolytic catheter for continuos thrombolytic therapy                         in the Left SFA, Popliteal Artery, Tibioperoneal trunk                         and Proximal PTA.                          08/31/2021: LLE Angiogram. Stent placement to the Left                         SFA with 6 mm diameter by 7.5 cm length Viabahn stent.                         Mechanical thrombectomy using the penumbra CAT 6 device                         to the Left SFA for Thrombus within the stent. PTA of                         the Left PTA and Tibioperoneal trunk with 3 mm diameter                         by 15 cm length angioplasty balloon. Comparison Study: 08/29/2021 Performing Technologist: Debbe Bales RVS  Examination Guidelines: A complete evaluation includes at minimum, Doppler waveform signals and systolic  blood pressure reading at the level of bilateral brachial, anterior tibial, and posterior tibial arteries, when vessel segments are accessible. Bilateral testing is considered an integral part of a complete examination. Photoelectric Plethysmograph (PPG) waveforms and toe systolic pressure readings are included as required and additional duplex testing as needed. Limited examinations for reoccurring indications may be performed as noted.  ABI Findings: +---------+------------------+-----+---------+--------+ Right    Rt Pressure (mmHg)IndexWaveform Comment  +---------+------------------+-----+---------+--------+ Brachial 161                                      +---------+------------------+-----+---------+--------+  ATA      168               1.04 triphasic         +---------+------------------+-----+---------+--------+ PTA      177               1.10 triphasic         +---------+------------------+-----+---------+--------+ Great Toe164               1.02                   +---------+------------------+-----+---------+--------+ +---------+------------------+-----+-------------------+-------+ Left     Lt Pressure (mmHg)IndexWaveform           Comment +---------+------------------+-----+-------------------+-------+ Brachial 161                                               +---------+------------------+-----+-------------------+-------+ ATA      47                0.29 dampened monophasic        +---------+------------------+-----+-------------------+-------+ PTA      75                0.47 monophasic                 +---------+------------------+-----+-------------------+-------+ Great Toe71                0.44 Abnormal                   +---------+------------------+-----+-------------------+-------+ +-------+-----------+-----------+------------+------------+ ABI/TBIToday's ABIToday's TBIPrevious ABIPrevious TBI  +-------+-----------+-----------+------------+------------+ Right  1.10       1.02       1.11        .82          +-------+-----------+-----------+------------+------------+ Left   .47        .44        .47         .35          +-------+-----------+-----------+------------+------------+ Bilateral ABIs appear essentially unchanged compared to prior study on 08/29/2021. Bilateral TBIs appear decreased compared to prior study on 08/29/2021.  Summary: Right: Resting right ankle-brachial index is within normal range. The right toe-brachial index is normal. Left: Resting left ankle-brachial index indicates severe left lower extremity arterial disease. The left toe-brachial index is abnormal. Imaging and Waveforms obtained of the Left Popliteal Artery, PTA, ATA and Peroneal Artery. No flow detected in the Left Popliteal Artery. *See table(s) above for measurements and observations.   Electronically signed by Festus Barren MD on 06/11/2022 at 1:01:09 PM.    Final        Assessment & Plan:   1. 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  He still has diminished flow in the left lower extremity but is somewhat improved.  He also notes that his claudication distance has improved.  Because he is continuing to do well he will continue with being active and walking.  We will not proceed with intervention at this time.  Given that the patient does not have rest pain or ulceration this is a reasonable option.  The patient will continue with current medications.  He will also attempt to become more active as this may help with his collateralization and decrease in  symptoms.  We will have the patient return in 6 months to reevaluate symptoms as well as if he begins to have any worsening symptoms he is advised to contact our office for further evaluation. - VAS [place] ABI WITH/WO TBI  2.  Tobacco abuse The patient continues to remain abstinent  3. Centrilobular emphysema (HCC) Continue pulmonary medications and aerosols as already ordered, these medications have been reviewed and there are no changes at this time.    Current Outpatient Medications on File Prior to Visit  Medication Sig Dispense Refill   acetaminophen (TYLENOL) 500 MG tablet Take 500 mg by mouth every 6 (six) hours as needed.     albuterol (VENTOLIN HFA) 108 (90 Base) MCG/ACT inhaler Inhale 2 puffs into the lungs every 6 (six) hours as needed for shortness of breath. 8 g 0   ASPIRIN LOW DOSE 81 MG tablet TAKE 1 TABLET BY MOUTH EVERY DAY 90 tablet 3   budesonide-formoterol (SYMBICORT) 160-4.5 MCG/ACT inhaler TAKE 2 PUFFS BY MOUTH TWICE A DAY 30.6 each 2   Cyanocobalamin 2500 MCG TABS Take 1 tablet by mouth daily.     doxylamine, Sleep, (UNISOM) 25 MG tablet Take 25 mg by mouth at bedtime as needed.     ELIQUIS 5 MG TABS tablet TAKE 1 TABLET BY MOUTH TWICE A DAY 60 tablet 11   esomeprazole (NEXIUM) 20 MG capsule Take 20 mg by mouth daily as needed (heartburn).      ibuprofen (ADVIL) 200 MG tablet Take 200-400 mg by mouth every 6 (six) hours as needed for fever or mild pain.     losartan (COZAAR) 50 MG tablet TAKE 1 TABLET BY MOUTH EVERY DAY 90 tablet 3   Multiple Vitamins-Minerals (MULTIVITAMIN WITH MINERALS) tablet Take 1 tablet by mouth daily.     No current facility-administered medications on file prior to visit.    There are no Patient Instructions on file for this visit. No follow-ups on file.   Georgiana Spinner, NP

## 2022-11-01 LAB — VAS US ABI WITH/WO TBI
Left ABI: 0.59
Right ABI: 1.09

## 2022-11-07 ENCOUNTER — Ambulatory Visit (INDEPENDENT_AMBULATORY_CARE_PROVIDER_SITE_OTHER): Payer: Medicare Other

## 2022-11-07 VITALS — Ht 76.0 in | Wt 221.0 lb

## 2022-11-07 DIAGNOSIS — Z0000 Encounter for general adult medical examination without abnormal findings: Secondary | ICD-10-CM | POA: Diagnosis not present

## 2022-11-07 NOTE — Patient Instructions (Signed)
[name] , Thank you for taking time to come for your Medicare Wellness Visit. I appreciate your ongoing commitment to your health goals. Please review the following plan we discussed and let me know if I can assist you in the future.   These are the goals we discussed:  Goals      Increase physical activity     Starting 02/10/2018, I will continue to walk at least 3/4 mile daily.      Patient Stated     02/23/2020, I will maintain and continue medications as prescribed.      Patient Stated     Would like to maintain current routine      Patient Stated     Walk more        This is a list of the screening recommended for you and due dates:  Health Maintenance  Topic Date Due   COVID-19 Vaccine (1) Never done   Pneumonia Vaccine (1 of 2 - PCV) Never done   DTaP/Tdap/Td vaccine (1 - Tdap) Never done   Zoster (Shingles) Vaccine (1 of 2) Never done   Flu Shot  02/21/2023   Screening for Lung Cancer  03/10/2023   Medicare Annual Wellness Visit  11/07/2023   Colon Cancer Screening  02/08/2024   Hepatitis C Screening: USPSTF Recommendation to screen - Ages 18-79 yo.  Completed   HPV Vaccine  Aged Out    Advanced directives: none  Conditions/risks identified: Aim for 30 minutes of exercise or brisk walking, 6-8 glasses of water, and 5 servings of fruits and vegetables each day.   Next appointment: Follow up in one year for your annual wellness visit. 11/11/23 @ 2:30   Preventive Care 65 Years and Older, Male  Preventive care refers to lifestyle choices and visits with your health care provider that can promote health and wellness. What does preventive care include? A yearly physical exam. This is also called an annual well check. Dental exams once or twice a year. Routine eye exams. Ask your health care provider how often you should have your eyes checked. Personal lifestyle choices, including: Daily care of your teeth and gums. Regular physical activity. Eating a  healthy diet. Avoiding tobacco and drug use. Limiting alcohol use. Practicing safe sex. Taking low doses of aspirin every day. Taking vitamin and mineral supplements as recommended by your health care provider. What happens during an annual well check? The services and screenings done by your health care provider during your annual well check will depend on your age, overall health, lifestyle risk factors, and family history of disease. Counseling  Your health care provider may ask you questions about your: Alcohol use. Tobacco use. Drug use. Emotional well-being. Home and relationship well-being. Sexual activity. Eating habits. History of falls. Memory and ability to understand (cognition). Work and work Astronomer. Screening  You may have the following tests or measurements: Height, weight, and BMI. Blood pressure. Lipid and cholesterol levels. These may be checked every 5 years, or more frequently if you are over 53 years old. Skin check. Lung cancer screening. You may have this screening every year starting at age 41 if you have a 30-pack-year history of smoking and currently smoke or have quit within the past 15 years. Fecal occult blood test (FOBT) of the stool. You may have this test every year starting at age 45. Flexible sigmoidoscopy or colonoscopy. You may have a sigmoidoscopy every 5 years or a colonoscopy every 10 years starting at age 44. Prostate  cancer screening. Recommendations will vary depending on your family history and other risks. Hepatitis C blood test. Hepatitis B blood test. Sexually transmitted disease (STD) testing. Diabetes screening. This is done by checking your blood sugar (glucose) after you have not eaten for a while (fasting). You may have this done every 1-3 years. Abdominal aortic aneurysm (AAA) screening. You may need this if you are a current or former smoker. Osteoporosis. You may be screened starting at age 53 if you are at high risk. Talk  with your health care provider about your test results, treatment options, and if necessary, the need for more tests. Vaccines  Your health care provider may recommend certain vaccines, such as: Influenza vaccine. This is recommended every year. Tetanus, diphtheria, and acellular pertussis (Tdap, Td) vaccine. You may need a Td booster every 10 years. Zoster vaccine. You may need this after age 78. Pneumococcal 13-valent conjugate (PCV13) vaccine. One dose is recommended after age 29. Pneumococcal polysaccharide (PPSV23) vaccine. One dose is recommended after age 29. Talk to your health care provider about which screenings and vaccines you need and how often you need them. This information is not intended to replace advice given to you by your health care provider. Make sure you discuss any questions you have with your health care provider. Document Released: 08/05/2015 Document Revised: 03/28/2016 Document Reviewed: 05/10/2015 Elsevier Interactive Patient Education  2017 [employer].  Fall Prevention in the Home Falls can cause injuries. They can happen to people of all ages. There are many things you can do to make your home safe and to help prevent falls. What can I do on the outside of my home? Regularly fix the edges of walkways and driveways and fix any cracks. Remove anything that might make you trip as you walk through a door, such as a raised step or threshold. Trim any bushes or trees on the path to your home. Use bright outdoor lighting. Clear any walking paths of anything that might make someone trip, such as rocks or tools. Regularly check to see if handrails are loose or broken. Make sure that both sides of any steps have handrails. Any raised decks and porches should have guardrails on the edges. Have any leaves, snow, or ice cleared regularly. Use sand or salt on walking paths during winter. Clean up any spills in your garage right away. This includes oil or grease  spills. What can I do in the bathroom? Use night lights. Install grab bars by the toilet and in the tub and shower. Do not use towel bars as grab bars. Use non-skid mats or decals in the tub or shower. If you need to sit down in the shower, use a plastic, non-slip stool. Keep the floor dry. Clean up any water that spills on the floor as soon as it happens. Remove soap buildup in the tub or shower regularly. Attach bath mats securely with double-sided non-slip rug tape. Do not have throw rugs and other things on the floor that can make you trip. What can I do in the bedroom? Use night lights. Make sure that you have a light by your bed that is easy to reach. Do not use any sheets or blankets that are too big for your bed. They should not hang down onto the floor. Have a firm chair that has side arms. You can use this for support while you get dressed. Do not have throw rugs and other things on the floor that can make you trip. What  can I do in the kitchen? Clean up any spills right away. Avoid walking on wet floors. Keep items that you use a lot in easy-to-reach places. If you need to reach something above you, use a strong step stool that has a grab bar. Keep electrical cords out of the way. Do not use floor polish or wax that makes floors slippery. If you must use wax, use non-skid floor wax. Do not have throw rugs and other things on the floor that can make you trip. What can I do with my stairs? Do not leave any items on the stairs. Make sure that there are handrails on both sides of the stairs and use them. Fix handrails that are broken or loose. Make sure that handrails are as long as the stairways. Check any carpeting to make sure that it is firmly attached to the stairs. Fix any carpet that is loose or worn. Avoid having throw rugs at the top or bottom of the stairs. If you do have throw rugs, attach them to the floor with carpet tape. Make sure that you have a light switch at the  top of the stairs and the bottom of the stairs. If you do not have them, ask someone to add them for you. What else can I do to help prevent falls? Wear shoes that: Do not have high heels. Have rubber bottoms. Are comfortable and fit you well. Are closed at the toe. Do not wear sandals. If you use a stepladder: Make sure that it is fully opened. Do not climb a closed stepladder. Make sure that both sides of the stepladder are locked into place. Ask someone to hold it for you, if possible. Clearly mark and make sure that you can see: Any grab bars or handrails. First and last steps. Where the edge of each step is. Use tools that help you move around (mobility aids) if they are needed. These include: Canes. Walkers. Scooters. Crutches. Turn on the lights when you go into a dark area. Replace any light bulbs as soon as they burn out. Set up your furniture so you have a clear path. Avoid moving your furniture around. If any of your floors are uneven, fix them. If there are any pets around you, be aware of where they are. Review your medicines with your doctor. Some medicines can make you feel dizzy. This can increase your chance of falling. Ask your doctor what other things that you can do to help prevent falls. This information is not intended to replace advice given to you by your health care provider. Make sure you discuss any questions you have with your health care provider. Document Released: 05/05/2009 Document Revised: 12/15/2015 Document Reviewed: 08/13/2014 Elsevier Interactive Patient Education  2017 [employer].

## 2022-11-07 NOTE — Progress Notes (Signed)
I connected with  Shelbie Ammons on 11/07/22 by a audio enabled telemedicine application and verified that I am speaking with the correct person using two identifiers.  Patient Location: Home  Provider Location: Office/Clinic  I discussed the limitations of evaluation and management by telemedicine. The patient expressed understanding and agreed to proceed.  Subjective:   Nathaniel [name] is a 72 y.o. male who presents for Medicare Annual/Subsequent preventive examination.  Review of Systems     Cardiac Risk Factors include: advanced age (>57men, >57 women)     Objective:    Today's Vitals   11/07/22 1345  Weight: 221 lb (100.2 kg)  Height: 6\' 4"  (1.93 m)   Body mass index is 26.9 kg/m.     11/07/2022    2:02 PM 09/20/2021   12:07 PM 08/31/2021   12:30 PM 08/30/2021   10:37 AM 02/07/2021    7:33 AM 04/11/2020    9:00 PM 04/11/2020    2:12 PM  Advanced Directives  Does Patient Have a Medical Advance Directive?  No  No No No No  Would patient like information on creating a medical advance directive? No - Patient declined Yes (MAU/Ambulatory/Procedural Areas - Information given) No - Patient declined  No - Patient declined No - Patient declined     Current Medications (verified) Outpatient Encounter Medications as of 11/07/2022  Medication Sig   acetaminophen (TYLENOL) 500 MG tablet Take 500 mg by mouth every 6 (six) hours as needed.   albuterol (VENTOLIN HFA) 108 (90 Base) MCG/ACT inhaler Inhale 2 puffs into the lungs every 6 (six) hours as needed for shortness of breath.   ASPIRIN LOW DOSE 81 MG tablet TAKE 1 TABLET BY MOUTH EVERY DAY   budesonide-formoterol (SYMBICORT) 160-4.5 MCG/ACT inhaler TAKE 2 PUFFS BY MOUTH TWICE A DAY   Cyanocobalamin 2500 MCG TABS Take 1 tablet by mouth daily.   doxylamine, Sleep, (UNISOM) 25 MG tablet Take 25 mg by mouth at bedtime as needed.   ELIQUIS 5 MG TABS tablet TAKE 1 TABLET BY MOUTH TWICE A DAY   esomeprazole (NEXIUM) 20 MG capsule  Take 20 mg by mouth daily as needed (heartburn).    ibuprofen (ADVIL) 200 MG tablet Take 200-400 mg by mouth every 6 (six) hours as needed for fever or mild pain.   losartan (COZAAR) 50 MG tablet TAKE 1 TABLET BY MOUTH EVERY DAY   Multiple Vitamins-Minerals (MULTIVITAMIN WITH MINERALS) tablet Take 1 tablet by mouth daily.   No facility-administered encounter medications on file as of 11/07/2022.    Allergies (verified) Fentanyl, Prozac [fluoxetine hcl], and Statins   History: Past Medical History:  Diagnosis Date   Cataract    Chicken pox    GERD (gastroesophageal reflux disease)    Hyperlipidemia    PAD (peripheral artery disease)    Pain in both lower extremities 11/23/2016   Peripheral vascular disease    Pre-diabetes    Seizures    None since age 5   Past Surgical History:  Procedure Laterality Date   BACK SURGERY     COLONOSCOPY WITH PROPOFOL N/A 02/07/2021   Procedure: COLONOSCOPY WITH PROPOFOL;  Surgeon: Wyline Mood, MD;  Location: [facility];  Service: Gastroenterology;  Laterality: N/A;   LAMINECTOMY  1987   LOWER EXTREMITY ANGIOGRAPHY Left 05/02/2017   Procedure: Lower Extremity Angiography;  Surgeon: Annice Needy, MD;  Location: [facility] INVASIVE CV LAB;  Service: Cardiovascular;  Laterality: Left;   LOWER EXTREMITY ANGIOGRAPHY Left 11/16/2019   Procedure: LOWER EXTREMITY  ANGIOGRAPHY;  Surgeon: Annice Needy, MD;  Location: [facility] CV [facility];  Service: Cardiovascular;  Laterality: Left;   LOWER EXTREMITY ANGIOGRAPHY Left 04/11/2020   Procedure: LOWER EXTREMITY ANGIOGRAPHY;  Surgeon: Annice Needy, MD;  Location: [facility];  Service: Cardiovascular;  Laterality: Left;   LOWER EXTREMITY ANGIOGRAPHY Left 04/12/2020   Procedure: Lower Extremity Angiography;  Surgeon: Annice Needy, MD;  Location: [facility];  Service: Cardiovascular;  Laterality: Left;   LOWER EXTREMITY ANGIOGRAPHY Left 08/30/2021   Procedure: Lower Extremity Angiography;  Surgeon:  Annice Needy, MD;  Location: [facility];  Service: Cardiovascular;  Laterality: Left;   LOWER EXTREMITY INTERVENTION Left 08/31/2021   Procedure: LOWER EXTREMITY INTERVENTION;  Surgeon: Annice Needy, MD;  Location: [facility];  Service: Cardiovascular;  Laterality: Left;   TONSILLECTOMY     TOOTH EXTRACTION  2023   Family History  Problem Relation Age of Onset   Heart disease Mother        Arrthymia    Stroke Father        Deceased   Dementia Father    Heart disease Brother        Myocardial infarction   Colon cancer Neg Hx    Social History   Socioeconomic History   Marital status: Married    Spouse name: Not on file   Number of children: Not on file   Years of education: Not on file   Highest education level: Not on file  Occupational History   Not on file  Tobacco Use   Smoking status: Former    Packs/day: 1.00    Years: 50.00    Additional pack years: 0.00    Total pack years: 50.00    Types: Cigarettes    Quit date: 11/21/2019    Years since quitting: 2.9   Smokeless tobacco: Never  Vaping Use   Vaping Use: Never used  Substance and Sexual Activity   Alcohol use: Not Currently    Alcohol/week: 0.0 standard drinks of alcohol   Drug use: No   Sexual activity: Not on file  Other Topics Concern   Not on file  Social History Narrative   Retired.   Stays busy with household work, Programmer, applications.   Married for 3 years.   Grown children.   Enjoys reading.    Social Determinants of Health   Financial Resource Strain: Low Risk  (11/07/2022)   Overall Financial Resource Strain (CARDIA)    Difficulty of Paying Living Expenses: Not hard at all  Food Insecurity: No Food Insecurity (11/07/2022)   Hunger Vital Sign    Worried About Running Out of Food in the Last Year: Never true    Ran Out of Food in the Last Year: Never true  Transportation Needs: No Transportation Needs (11/07/2022)   PRAPARE - Administrator, Civil Service (Medical):  No    Lack of Transportation (Non-Medical): No  Physical Activity: Inactive (11/07/2022)   Exercise Vital Sign    Days of Exercise per Week: 0 days    Minutes of Exercise per Session: 0 min  Stress: No Stress Concern Present (11/07/2022)   [employer] of Occupational Health - Occupational Stress Questionnaire    Feeling of Stress : Not at all  Social Connections: Moderately Isolated (11/07/2022)   Social Connection and Isolation Panel [NHANES]    Frequency of Communication with Friends and Family: Once a week    Frequency of Social  Gatherings with Friends and Family: Once a week    Attends Religious Services: Never    Database administrator or Organizations: Yes    Attends Engineer, structural: More than 4 times per year    Marital Status: Married    Tobacco Counseling Counseling given: Not Answered   Clinical Intake:  Pre-visit preparation completed: Yes  Pain : No/denies pain     Nutritional Risks: None Diabetes: No  How often do you need to have someone help you when you read instructions, pamphlets, or other written materials from your doctor or pharmacy?: 1 - Never  Diabetic?no  Interpreter Needed?: No  Information entered by :: P.Foy CMA   Activities of Daily Living    11/07/2022    2:02 PM 11/07/2022   12:32 PM  In your present state of health, do you have any difficulty performing the following activities:  Hearing? 0 1  Vision? 1 1  Comment cataract right eye   Difficulty concentrating or making decisions? 0 0  Walking or climbing stairs? 1 1  Comment left leg pain   Dressing or bathing? 0 0  Doing errands, shopping? 0 0  Preparing Food and eating ? N N  Using the Toilet? N N  In the past six months, have you accidently leaked urine? N N  Do you have problems with loss of bowel control? N N  Managing your Medications? N N  Managing your Finances? N N  Housekeeping or managing your Housekeeping? N N    Patient Care Team: Doreene Nest, NP as PCP - General (Nurse Practitioner)  Indicate any recent Medical Services you may have received from other than [facility] providers in the past year (date may be approximate).     Assessment:   This is a routine wellness examination for [facility].  Hearing/Vision screen Hearing Screening - Comments:: Has aids Vision Screening - Comments:: Glasses-Dr. Dava Najjar  Dietary issues and exercise activities discussed: Current Exercise Habits: The patient does not participate in regular exercise at present, Exercise limited by: Other - see comments (left leg pain, difficulty walking distances)   Goals Addressed             This Visit's Progress    Patient Stated       Walk more       Depression Screen    11/07/2022    2:00 PM 09/20/2021   12:10 PM 02/23/2020    9:07 AM 02/20/2019    9:25 AM 02/10/2018    8:58 AM 01/16/2016   11:19 AM 01/17/2015   11:44 AM  PHQ 2/9 Scores  PHQ - 2 Score 0 0 0 0 1 0 4  PHQ- 9 Score   0  3  7    Fall Risk    11/07/2022    1:51 PM 11/07/2022   12:32 PM 09/20/2022   12:15 PM 09/20/2021   12:08 PM 02/23/2020    9:07 AM  Fall Risk   Falls in the past year? 0 0 0 0 0  Number falls in past yr: 0 0 0 0 0  Injury with Fall? 0 0 0 0 0  Risk for fall due to : No Fall Risks   Impaired balance/gait Medication side effect  Follow up Falls prevention discussed;Falls evaluation completed   Falls prevention discussed Falls evaluation completed;Falls prevention discussed    FALL RISK PREVENTION PERTAINING TO THE HOME:  Any stairs in or around the home? Yes  If so, are  there any without handrails? No  Home free of loose throw rugs in walkways, pet beds, electrical cords, etc? No  Adequate lighting in your home to reduce risk of falls? Yes   ASSISTIVE DEVICES UTILIZED TO PREVENT FALLS:  Life alert? No  Use of a cane, walker or w/c? No  Grab bars in the bathroom? Yes  Shower chair or bench in shower? No  Elevated toilet seat or a handicapped toilet? Yes     Cognitive Function:    02/23/2020    9:11 AM 02/10/2018    8:57 AM  MMSE - Mini Mental State Exam  Orientation to time 5 5  Orientation to Place 5 5  Registration 3 3  Attention/ Calculation 5 0  Recall 3 3  Language- name 2 objects  0  Language- repeat 1 1  Language- follow 3 step command  3  Language- read & follow direction  0  Write a sentence  0  Copy design  0  Total score  20        11/07/2022    2:05 PM 09/20/2021   12:16 PM  6CIT Screen  What Year? 0 points 0 points  What month? 0 points 0 points  What time? 0 points 0 points  Count back from 20 0 points 2 points  Months in reverse 0 points 4 points  Repeat phrase 4 points 0 points  Total Score 4 points 6 points    Immunizations Immunization History  Administered Date(s) Administered   Influenza-Unspecified 05/23/2018    TDAP status: Due, Education has been provided regarding the importance of this vaccine. Advised may receive this vaccine at local pharmacy or Health Dept. Aware to provide a copy of the vaccination record if obtained from local pharmacy or Health Dept. Verbalized acceptance and understanding.  Flu Vaccine status: Declined, Education has been provided regarding the importance of this vaccine but patient still declined. Advised may receive this vaccine at local pharmacy or Health Dept. Aware to provide a copy of the vaccination record if obtained from local pharmacy or Health Dept. Verbalized acceptance and understanding.  Pneumococcal vaccine status: Declined,  Education has been provided regarding the importance of this vaccine but patient still declined. Advised may receive this vaccine at local pharmacy or Health Dept. Aware to provide a copy of the vaccination record if obtained from local pharmacy or Health Dept. Verbalized acceptance and understanding.   Covid-19 vaccine status: Declined, Education has been provided regarding the importance of this vaccine but patient still declined.  Advised may receive this vaccine at local pharmacy or Health Dept.or vaccine clinic. Aware to provide a copy of the vaccination record if obtained from local pharmacy or Health Dept. Verbalized acceptance and understanding.  Qualifies for Shingles Vaccine? Yes   Zostavax completed No   Shingrix Completed?: No.    Education has been provided regarding the importance of this vaccine. Patient has been advised to call insurance company to determine out of pocket expense if they have not yet received this vaccine. Advised may also receive vaccine at local pharmacy or Health Dept. Verbalized acceptance and understanding.  Screening Tests Health Maintenance  Topic Date Due   COVID-19 Vaccine (1) Never done   Pneumonia Vaccine 43+ Years old (1 of 2 - PCV) Never done   DTaP/Tdap/Td (1 - Tdap) Never done   Zoster Vaccines- Shingrix (1 of 2) Never done   INFLUENZA VACCINE  02/21/2023   Lung Cancer Screening  03/10/2023   Medicare Annual Wellness (AWV)  11/07/2023   COLONOSCOPY (Pts 45-61yrs Insurance coverage will need to be confirmed)  02/08/2024   Hepatitis C Screening  Completed   HPV VACCINES  Aged Out    Health Maintenance  Health Maintenance Due  Topic Date Due   COVID-19 Vaccine (1) Never done   Pneumonia Vaccine 34+ Years old (1 of 2 - PCV) Never done   DTaP/Tdap/Td (1 - Tdap) Never done   Zoster Vaccines- Shingrix (1 of 2) Never done    Colorectal cancer screening: Type of screening: Colonoscopy. Completed 02/07/21. Repeat every 3 years  Lung Cancer Screening: (Low Dose CT Chest recommended if Age 1-80 years, 30 pack-year currently smoking OR have quit w/in 15years.) does qualify.   Lung Cancer Screening Referral:ordered 03/13/22  Additional Screening:  Hepatitis C Screening: does qualify; Completed 03/09/2022  Vision Screening: Recommended annual ophthalmology exams for early detection of glaucoma and other disorders of the eye. Is the patient up to date with their annual  eye exam?  Yes  Who is the provider or what is the name of the office in which the patient attends annual eye exams? Dr.Minton If pt is not established with a provider, would they like to be referred to a provider to establish care? No .   Dental Screening: Recommended annual dental exams for proper oral hygiene  Community Resource Referral / Chronic Care Management: CRR required this visit?  No   CCM required this visit?  No      Plan:     I have personally reviewed and noted the following in the patient's chart:   Medical and social history Use of alcohol, tobacco or illicit drugs  Current medications and supplements including opioid prescriptions. Patient is not currently taking opioid prescriptions. Functional ability and status Nutritional status Physical activity Advanced directives List of other physicians Hospitalizations, surgeries, and ER visits in previous 12 months Vitals Screenings to include cognitive, depression, and falls Referrals and appointments  In addition, I have reviewed and discussed with patient certain preventive protocols, quality metrics, and best practice recommendations. A written personalized care plan for preventive services as well as general preventive health recommendations were provided to patient.     Maryan Puls, LPN   1/61/0960   Nurse Notes: Vaccinations: declines all Influenza vaccine: recommend every Fall Pneumococcal vaccine: recommend once per lifetime Prevnar-20 Tdap vaccine: recommend every 10 years Shingles vaccine: recommend Shingrix which is 2 doses 2-6 months apart and over 90% effective     Covid-19: recommend 2 doses one month apart with a booster 6 months later

## 2022-11-20 DIAGNOSIS — M9905 Segmental and somatic dysfunction of pelvic region: Secondary | ICD-10-CM | POA: Diagnosis not present

## 2022-11-20 DIAGNOSIS — M62830 Muscle spasm of back: Secondary | ICD-10-CM | POA: Diagnosis not present

## 2022-11-20 DIAGNOSIS — M9903 Segmental and somatic dysfunction of lumbar region: Secondary | ICD-10-CM | POA: Diagnosis not present

## 2022-11-20 DIAGNOSIS — M5136 Other intervertebral disc degeneration, lumbar region: Secondary | ICD-10-CM | POA: Diagnosis not present

## 2022-12-18 DIAGNOSIS — M9905 Segmental and somatic dysfunction of pelvic region: Secondary | ICD-10-CM | POA: Diagnosis not present

## 2022-12-18 DIAGNOSIS — M9903 Segmental and somatic dysfunction of lumbar region: Secondary | ICD-10-CM | POA: Diagnosis not present

## 2022-12-18 DIAGNOSIS — M5136 Other intervertebral disc degeneration, lumbar region: Secondary | ICD-10-CM | POA: Diagnosis not present

## 2022-12-18 DIAGNOSIS — M62830 Muscle spasm of back: Secondary | ICD-10-CM | POA: Diagnosis not present

## 2022-12-29 ENCOUNTER — Other Ambulatory Visit (INDEPENDENT_AMBULATORY_CARE_PROVIDER_SITE_OTHER): Payer: Self-pay | Admitting: Nurse Practitioner

## 2023-01-15 DIAGNOSIS — M5136 Other intervertebral disc degeneration, lumbar region: Secondary | ICD-10-CM | POA: Diagnosis not present

## 2023-01-15 DIAGNOSIS — M62830 Muscle spasm of back: Secondary | ICD-10-CM | POA: Diagnosis not present

## 2023-01-15 DIAGNOSIS — M9903 Segmental and somatic dysfunction of lumbar region: Secondary | ICD-10-CM | POA: Diagnosis not present

## 2023-01-15 DIAGNOSIS — M9905 Segmental and somatic dysfunction of pelvic region: Secondary | ICD-10-CM | POA: Diagnosis not present

## 2023-02-12 DIAGNOSIS — M62830 Muscle spasm of back: Secondary | ICD-10-CM | POA: Diagnosis not present

## 2023-02-12 DIAGNOSIS — M9905 Segmental and somatic dysfunction of pelvic region: Secondary | ICD-10-CM | POA: Diagnosis not present

## 2023-02-12 DIAGNOSIS — M9903 Segmental and somatic dysfunction of lumbar region: Secondary | ICD-10-CM | POA: Diagnosis not present

## 2023-02-12 DIAGNOSIS — M5136 Other intervertebral disc degeneration, lumbar region: Secondary | ICD-10-CM | POA: Diagnosis not present

## 2023-03-11 ENCOUNTER — Ambulatory Visit
Admission: RE | Admit: 2023-03-11 | Discharge: 2023-03-11 | Disposition: A | Discharge status: Home / Self Care | Payer: Medicare Other | Source: Ambulatory Visit | Attending: Internal Medicine | Admitting: Internal Medicine

## 2023-03-11 DIAGNOSIS — Z122 Encounter for screening for malignant neoplasm of respiratory organs: Secondary | ICD-10-CM | POA: Insufficient documentation

## 2023-03-11 DIAGNOSIS — Z87891 Personal history of nicotine dependence: Secondary | ICD-10-CM | POA: Insufficient documentation

## 2023-03-11 DIAGNOSIS — F17210 Nicotine dependence, cigarettes, uncomplicated: Secondary | ICD-10-CM | POA: Insufficient documentation

## 2023-03-12 DIAGNOSIS — M9903 Segmental and somatic dysfunction of lumbar region: Secondary | ICD-10-CM | POA: Diagnosis not present

## 2023-03-12 DIAGNOSIS — M62830 Muscle spasm of back: Secondary | ICD-10-CM | POA: Diagnosis not present

## 2023-03-12 DIAGNOSIS — M9905 Segmental and somatic dysfunction of pelvic region: Secondary | ICD-10-CM | POA: Diagnosis not present

## 2023-03-12 DIAGNOSIS — M5136 Other intervertebral disc degeneration, lumbar region: Secondary | ICD-10-CM | POA: Diagnosis not present

## 2023-03-18 ENCOUNTER — Other Ambulatory Visit: Payer: Self-pay | Admitting: Primary Care

## 2023-03-18 ENCOUNTER — Telehealth: Payer: Self-pay | Admitting: Primary Care

## 2023-03-18 ENCOUNTER — Telehealth: Payer: Self-pay | Admitting: Acute Care

## 2023-03-18 DIAGNOSIS — Z122 Encounter for screening for malignant neoplasm of respiratory organs: Secondary | ICD-10-CM

## 2023-03-18 DIAGNOSIS — Z87891 Personal history of nicotine dependence: Secondary | ICD-10-CM

## 2023-03-18 DIAGNOSIS — E042 Nontoxic multinodular goiter: Secondary | ICD-10-CM

## 2023-03-18 NOTE — Telephone Encounter (Addendum)
Please call patient:  Notify him that we need to obtain an ultrasound of this thyroid gland which shows 2 nodules. Is he willing to have the ultrasound? The thyroid gland sits at the base of the front of the neck/throat.   Also needs follow up scheduled for late November.     ----- Message from Nurse Earnie Larsson sent at 03/18/2023  4:20 PM EDT ----- Discussed results with patient's wife (on DPR) with specific mention of thyroid nodule in comparison to last years LDCT and radiologists recommendations to have thyroid US. Thanks!  Stable bilateral thyroid nodules, largest a 1.8 cm hypodense left thyroid nodule. Recommend thyroid US (ref: J Am Coll Radiol. 2015 Feb;12(2): 143-50).

## 2023-03-18 NOTE — Telephone Encounter (Signed)
Called and spoke to pt's wife, Harriett Sine, on [place]. Informed her of the results from recent LDCT. We discussed the results of the LDCT in its entirety but discussed the 1.8cm thyroid nodule in more detail. Advised her it was reccommended that pt follow up with PCP and discuss a thyroid ultrasound. Results sent to PCP. Harriett Sine verbalized understanding. New order placed for next years LDCT.    IMPRESSION: 1. Lung-RADS 2, benign appearance or behavior. Continue annual screening with low-dose chest CT without contrast in 12 months. 2. Two-vessel coronary atherosclerosis. 3. Small hiatal hernia. 4. Stable bilateral thyroid nodules, largest a 1.8 cm hypodense left thyroid nodule. Recommend thyroid US (ref: J Am Coll Radiol. 2015 Feb;12(2): 143-50). 5. Aortic Atherosclerosis (ICD10-I70.0) and Emphysema (ICD10-J43.9).     Electronically Signed   By: Delbert Phenix M.D.   On: 03/18/2023 11:46

## 2023-03-19 NOTE — Telephone Encounter (Signed)
Noted, orders placed. 

## 2023-03-19 NOTE — Telephone Encounter (Signed)
Called patient and reviewed all information. Patient verbalized understanding. He is agreeable to thyroid ultrasound, prefers order to go to [facility].  Scheduled f/u for Nov.

## 2023-03-20 ENCOUNTER — Encounter: Payer: Self-pay | Admitting: *Deleted

## 2023-03-26 ENCOUNTER — Ambulatory Visit (INDEPENDENT_AMBULATORY_CARE_PROVIDER_SITE_OTHER): Payer: Medicare Other | Admitting: Gastroenterology

## 2023-03-26 ENCOUNTER — Encounter: Payer: Self-pay | Admitting: Gastroenterology

## 2023-03-26 VITALS — BP 160/95 | HR 85 | Temp 98.1°F | Ht 76.0 in | Wt 209.0 lb

## 2023-03-26 DIAGNOSIS — K648 Other hemorrhoids: Secondary | ICD-10-CM | POA: Diagnosis not present

## 2023-03-26 NOTE — Progress Notes (Signed)
Wyline Mood MD, MRCP(U.K) [address]  [address]  [place], [place] [address]  Main: [phone]  Fax: [phone]   Gastroenterology Consultation  Referring Provider:     Doreene Nest, NP Primary Care Physician:  Doreene Nest, NP Primary Gastroenterologist:  Dr. Wyline Mood  Reason for Consultation:    Internal hemorrhoids        HPI:   Nathaniel [name] is a 72 y.o. y/o male referred for consultation & management  by Doreene Nest, NP.     He has been referred for internal MRIs.  Last colonoscopy performed by myself 2 years back showed 4 sessile polyps subcentimeter internal hemorrhoids were noted.  The polyps were tubular adenomas.  Hemoglobin 14.3 g.  He says that when he has a hard bowel movement he passes some blood in the stool and has perianal discomfort he has tried high-fiber diet perianal toileting and hygiene still has recurrence of the same issue.  Would like to get his hemorrhoids banded.  Last dose of Eliquis was yesterday  Past Medical History:  Diagnosis Date   Cataract    Chicken pox    GERD (gastroesophageal reflux disease)    Hyperlipidemia    PAD (peripheral artery disease) (HCC)    Pain in both lower extremities 11/23/2016   Peripheral vascular disease (HCC)    Pre-diabetes    Seizures (HCC)    None since age 56    Past Surgical History:  Procedure Laterality Date   BACK SURGERY     COLONOSCOPY WITH PROPOFOL N/A 02/07/2021   Procedure: COLONOSCOPY WITH PROPOFOL;  Surgeon: Wyline Mood, MD;  Location: [facility] ENDOSCOPY;  Service: Gastroenterology;  Laterality: N/A;   LAMINECTOMY  1987   LOWER EXTREMITY ANGIOGRAPHY Left 05/02/2017   Procedure: Lower Extremity Angiography;  Surgeon: Annice Needy, MD;  Location: [facility] INVASIVE CV LAB;  Service: Cardiovascular;  Laterality: Left;   LOWER EXTREMITY ANGIOGRAPHY Left 11/16/2019   Procedure: LOWER EXTREMITY ANGIOGRAPHY;  Surgeon: Annice Needy, MD;  Location: [facility] INVASIVE CV LAB;  Service:  Cardiovascular;  Laterality: Left;   LOWER EXTREMITY ANGIOGRAPHY Left 04/11/2020   Procedure: LOWER EXTREMITY ANGIOGRAPHY;  Surgeon: Annice Needy, MD;  Location: [facility] INVASIVE CV LAB;  Service: Cardiovascular;  Laterality: Left;   LOWER EXTREMITY ANGIOGRAPHY Left 04/12/2020   Procedure: Lower Extremity Angiography;  Surgeon: Annice Needy, MD;  Location: [facility] INVASIVE CV LAB;  Service: Cardiovascular;  Laterality: Left;   LOWER EXTREMITY ANGIOGRAPHY Left 08/30/2021   Procedure: Lower Extremity Angiography;  Surgeon: Annice Needy, MD;  Location: [facility] INVASIVE CV LAB;  Service: Cardiovascular;  Laterality: Left;   LOWER EXTREMITY INTERVENTION Left 08/31/2021   Procedure: LOWER EXTREMITY INTERVENTION;  Surgeon: Annice Needy, MD;  Location: [facility] INVASIVE CV LAB;  Service: Cardiovascular;  Laterality: Left;   TONSILLECTOMY     TOOTH EXTRACTION  2023    Prior to Admission medications   Medication Sig Start Date End Date Taking? Authorizing Provider  acetaminophen (TYLENOL) 500 MG tablet Take 500 mg by mouth every 6 (six) hours as needed.    [provider]  albuterol (VENTOLIN HFA) 108 (90 Base) MCG/ACT inhaler Inhale 2 puffs into the lungs every 6 (six) hours as needed for shortness of breath. 03/09/20   Doreene Nest, NP  ASPIRIN LOW DOSE 81 MG tablet TAKE 1 TABLET BY MOUTH EVERY DAY 12/31/22   Georgiana Spinner, NP  budesonide-formoterol (SYMBICORT) 160-4.5 MCG/ACT inhaler TAKE 2 PUFFS BY MOUTH  TWICE A DAY 10/07/22   Doreene Nest, NP  Cyanocobalamin 2500 MCG TABS Take 1 tablet by mouth daily.    [provider]  doxylamine, Sleep, (UNISOM) 25 MG tablet Take 25 mg by mouth at bedtime as needed.    [provider]  ELIQUIS 5 MG TABS tablet TAKE 1 TABLET BY MOUTH TWICE A DAY 05/14/22   Georgiana Spinner, NP  esomeprazole (NEXIUM) 20 MG capsule Take 20 mg by mouth daily as needed (heartburn).     [provider]  ibuprofen (ADVIL) 200 MG tablet Take 200-400 mg  by mouth every 6 (six) hours as needed for fever or mild pain.    [provider]  losartan (COZAAR) 50 MG tablet TAKE 1 TABLET BY MOUTH EVERY DAY 04/02/22   Antonieta Iba, MD  Multiple Vitamins-Minerals (MULTIVITAMIN WITH MINERALS) tablet Take 1 tablet by mouth daily.    [provider]    Family History  Problem Relation Age of Onset   Heart disease Mother        Arrthymia    Stroke Father        Deceased   Dementia Father    Heart disease Brother        Myocardial infarction   Colon cancer Neg Hx      Social History   Tobacco Use   Smoking status: Former    Current packs/day: 0.00    Average packs/day: 1 pack/day for 50.0 years (50.0 ttl pk-yrs)    Types: Cigarettes    Start date: 11/20/1969    Quit date: 11/21/2019    Years since quitting: 3.3   Smokeless tobacco: Never  Vaping Use   Vaping status: Never Used  Substance Use Topics   Alcohol use: Not Currently    Alcohol/week: 0.0 standard drinks of alcohol   Drug use: No    Allergies as of 03/26/2023 - Review Complete 03/26/2023  Allergen Reaction Noted   Fentanyl Other (See Comments) 04/08/2020   Prozac [fluoxetine hcl] Other (See Comments) 11/16/2014   Statins Other (See Comments) 11/14/2017    Review of Systems:    All systems reviewed and negative except where noted in HPI.   Physical Exam:  BP (!) 167/100   Pulse 93   Temp 98.1 F (36.7 C) (Oral)   Ht 6\' 4"  (1.93 m)   Wt 209 lb (94.8 kg)   BMI 25.44 kg/m  No LMP for male patient. Psych:  Alert and cooperative. Normal mood and affect. General:   Alert,  Well-developed, well-nourished, pleasant and cooperative in NAD Head:  Normocephalic and atraumatic. Neurologic:  Alert and oriented x3;  grossly normal neurologically. Psych:  Alert and cooperative. Normal mood and affect.  Imaging Studies: CT CHEST LUNG CA SCREEN LOW DOSE W/O CM  Result Date: 03/18/2023 CLINICAL DATA:  72 year old male former smoker with 56 pack-year smoking  history, quit smoking 2021. EXAM: CT CHEST WITHOUT CONTRAST LOW-DOSE FOR LUNG CANCER SCREENING TECHNIQUE: Multidetector CT imaging of the chest was performed following the standard protocol without IV contrast. RADIATION DOSE REDUCTION: This exam was performed according to the departmental dose-optimization program which includes automated exposure control, adjustment of the mA and/or kV according to patient size and/or use of iterative reconstruction technique. COMPARISON:  03/09/2022 screening chest CT. FINDINGS: Cardiovascular: Normal heart size. No significant pericardial effusion/thickening. Left anterior descending and right coronary atherosclerosis. Atherosclerotic nonaneurysmal thoracic aorta. Normal caliber pulmonary arteries. Mediastinum/Nodes: Stable bilateral thyroid nodules, largest a hypodense 1.8 cm left thyroid  nodule. Unremarkable esophagus. No pathologically enlarged axillary, mediastinal or hilar lymph nodes, noting limited sensitivity for the detection of hilar adenopathy on this noncontrast study. Lungs/Pleura: No pneumothorax. No pleural effusion. Mild centrilobular emphysema with diffuse bronchial wall thickening. No acute consolidative airspace disease or lung masses. No significant growth of previously visualized tiny scattered right pulmonary nodules. No new significant pulmonary nodules. Upper abdomen: Small hiatal hernia. Musculoskeletal: No aggressive appearing focal osseous lesions. Moderate thoracic spondylosis. IMPRESSION: 1. Lung-RADS 2, benign appearance or behavior. Continue annual screening with low-dose chest CT without contrast in 12 months. 2. Two-vessel coronary atherosclerosis. 3. Small hiatal hernia. 4. Stable bilateral thyroid nodules, largest a 1.8 cm hypodense left thyroid nodule. Recommend thyroid US (ref: J Am Coll Radiol. 2015 Feb;12(2): 143-50). 5. Aortic Atherosclerosis (ICD10-I70.0) and Emphysema (ICD10-J43.9). Electronically Signed   By: Delbert Phenix M.D.   On:  03/18/2023 11:46    Assessment and Plan:   Nathaniel [name] is a 72 y.o. y/o male has been referred for banding last dose of Eliquis was taken yesterday.  Will need to get holding instructions for 3 days for Eliquis to be held following which we can perform hemorrhoidal banding  Follow up in 2 weeks  Dr Wyline Mood MD,MRCP(U.K)

## 2023-03-26 NOTE — Patient Instructions (Addendum)
We will get your vascular surgeon-Dr. Wyn Quaker to give [place] the okay to hold your eliquis for 3 days prior to having the hemorrhoid banding. Then we will call you as in when to schedule your fist banding.

## 2023-03-27 ENCOUNTER — Telehealth: Payer: Self-pay

## 2023-03-27 NOTE — Telephone Encounter (Signed)
Called patient to let him know that Dr. Wyn Quaker reached out to me and let me know that he is able to hold his blood thinner 2 days before his hemorrhoid banding. Patient understood and had no further questions. Appointment 04/09/2023 at 1:45 PM.

## 2023-03-27 NOTE — Telephone Encounter (Signed)
-----   Message from Bassett sent at 03/27/2023 10:10 AM EDT ----- May stop two days before the procedure and and resume the following day. ----- Message ----- From: Adela Ports, CMA Sent: 03/26/2023   4:35 PM EDT To: Annice Needy, MD

## 2023-03-28 ENCOUNTER — Other Ambulatory Visit: Payer: Self-pay | Admitting: Cardiovascular Disease

## 2023-03-28 NOTE — Telephone Encounter (Signed)
Hi,  Could you please schedule this patient a 12 month follow up visit? The patient was last seen by Dr. Mariah Milling on 10-17-21. Thank you so much.

## 2023-04-01 ENCOUNTER — Telehealth: Payer: Self-pay | Admitting: Cardiovascular Disease

## 2023-04-01 NOTE — Telephone Encounter (Signed)
Left voice mail, pt needs 12 mo appt scheduled from recall.

## 2023-04-01 NOTE — Telephone Encounter (Signed)
Left voice mail to schedule appt

## 2023-04-04 NOTE — Telephone Encounter (Signed)
Pt scheduled on 9/10

## 2023-04-09 ENCOUNTER — Ambulatory Visit (INDEPENDENT_AMBULATORY_CARE_PROVIDER_SITE_OTHER): Payer: Medicare Other | Admitting: Gastroenterology

## 2023-04-09 ENCOUNTER — Encounter: Payer: Self-pay | Admitting: Gastroenterology

## 2023-04-09 VITALS — BP 159/88 | HR 78 | Temp 98.0°F | Ht 76.0 in | Wt 206.5 lb

## 2023-04-09 DIAGNOSIS — K648 Other hemorrhoids: Secondary | ICD-10-CM

## 2023-04-09 DIAGNOSIS — M9903 Segmental and somatic dysfunction of lumbar region: Secondary | ICD-10-CM | POA: Diagnosis not present

## 2023-04-09 DIAGNOSIS — M5136 Other intervertebral disc degeneration, lumbar region: Secondary | ICD-10-CM | POA: Diagnosis not present

## 2023-04-09 DIAGNOSIS — M62830 Muscle spasm of back: Secondary | ICD-10-CM | POA: Diagnosis not present

## 2023-04-09 DIAGNOSIS — M9905 Segmental and somatic dysfunction of pelvic region: Secondary | ICD-10-CM | POA: Diagnosis not present

## 2023-04-09 NOTE — Progress Notes (Unsigned)
Patient follow-ups today for banding of hemorrhoids    Summary of history :  Last colonoscopy performed by myself 2 years back showed 4 sessile polyps subcentimeter internal hemorrhoids were noted. The polyps were tubular adenomas. Hemoglobin 14.3 g. He says that when he has a hard bowel movement he passes some blood in the stool and has perianal discomfort he has tried high-fiber diet perianal toileting and hygiene still has recurrence of the same issue. Would like to get his hemorrhoids banded   He is on Eliquis which has been held.  Discussed the risk versus benefits of hemorrhoidal banding including but not limited to infection bleeding pelvic sepsis syndrome and pain.  Digital rectal exam performed in the presence of a chaperone. External anal findings: normal  Internal findings:  , No masses, no blood on glove noticed.  Chaperone present in the room  PROCEDURE NOTE: The patient presents with symptomatic grade 1 hemorrhoids, unresponsive to maximal medical therapy, requesting rubber band ligation of his/her hemorrhoidal disease.  All risks, benefits and alternative forms of therapy were described and informed consent was obtained.  In the Left Lateral Decubitus position (if anoscopy is performed) anoscopic examination revealed grade 1 hemorrhoids in the all position(s).   The decision was made to band the RA internal hemorrhoid, and the [facility]'Regan System was used to perform band ligation without complication.  Digital anorectal examination was then performed to assure proper positioning of the band, and to adjust the banded tissue as required.  The patient was discharged home without pain or other issues.  Dietary and behavioral recommendations were given and (if necessary - prescriptions were given), along with follow-up instructions.  The patient will return 4 weeks for follow-up and possible additional banding as required.  No complications were encountered and the patient tolerated  the procedure well.   Plan:  Avoid constipation.  Restart blood thinner after skipping 2 doses  Follow-up:4 weeks  Dr Wyline Mood MD,MRCP [facility]) Gastroenterology/Hepatology Pager: [phone]

## 2023-04-21 NOTE — Progress Notes (Unsigned)
Cardiology Office Note  Date:  04/22/2023   ID:  Nathaniel [name], DOB 1951/01/25, MRN [id number]  PCP:  Doreene Nest, NP   Chief Complaint  Patient presents with   12 month follow up     Patient c/o shortness of breath with walking and chest tightness at times. Medications reviewed by the patient verbally.     HPI:  Nathaniel [name] is a 72 year old gentleman with past medical history of PAD, carotid stenosis, subclavian stenosis status post extensive left lower extremity revascularization for repeat thrombosis.   multiple interventions on that left leg and  right external iliac artery. 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  No plan for surgery  Reports statin intolerance Previously tried Zetia, unclear side effect Previously discussed PCSK9 inhibitor  EKG personally reviewed by myself on todays visit EKG Interpretation Date/Time:  Monday April 22 2023 11:43:21 EDT Ventricular Rate:  96 PR Interval:  158 QRS Duration:  84 QT Interval:  362 QTC Calculation: 457 R Axis:   -21  Text Interpretation: Normal sinus rhythm Cannot rule out Inferior infarct , age undetermined No previous ECGs available Confirmed by Julien Nordmann [phone]) on 04/22/2023 11:56:47 AM    Echocardiogram Low normal ejection fraction estimated 50% Normal RV function no significant valve disease  Stress test 9/22   Normal pharmacologic myocardial perfusion stress test without significant ischemia or scar.    Left ventricular function is normal (55-65%).   There is no significant coronary artery calcification.  Mild aortic atherosclerosis is noted on the attenuation correction CT.   This is a low risk study.   02/2020: chest CT, Minimal aortic athero, no significant coronary calcification noted  noninvasive studies on 03/21/2020 showed 1 to 39% stenosis of the right carotid normal in the left carotid.  However the right subclavian was noted to be stenotic with turbulent flow.  noninvasive study 6/22 showed only 39% internal carotid artery stenosis bilaterally.  The left subclavian artery is stenotic although the velocities are consistent with previous studies.  The right clavian artery had normal flow hemodynamics.    PMH:   has a past medical history of Cataract, Chicken pox, GERD (gastroesophageal reflux disease), Hyperlipidemia, PAD (peripheral artery disease) (HCC), Pain in both lower extremities (11/23/2016), Peripheral vascular disease (HCC), Pre-diabetes, and Seizures (HCC).  PSH:    Past Surgical History:  Procedure Laterality Date   BACK SURGERY     COLONOSCOPY WITH PROPOFOL N/A 02/07/2021   Procedure: COLONOSCOPY WITH PROPOFOL;  Surgeon: Wyline Mood, MD;  Location: [facility] ENDOSCOPY;  Service: Gastroenterology;  Laterality: N/A;   LAMINECTOMY  1987   LOWER EXTREMITY ANGIOGRAPHY Left 05/02/2017   Procedure: Lower Extremity Angiography;  Surgeon: Annice Needy, MD;  Location: [facility] INVASIVE CV LAB;  Service: Cardiovascular;  Laterality: Left;   LOWER EXTREMITY ANGIOGRAPHY Left 11/16/2019   Procedure: LOWER EXTREMITY ANGIOGRAPHY;  Surgeon: Annice Needy, MD;  Location: [facility] INVASIVE CV LAB;  Service: Cardiovascular;  Laterality: Left;   LOWER EXTREMITY ANGIOGRAPHY Left 04/11/2020   Procedure: LOWER EXTREMITY ANGIOGRAPHY;  Surgeon: Festus Barren  S, MD;  Location: [facility] INVASIVE CV LAB;  Service: Cardiovascular;  Laterality: Left;   LOWER EXTREMITY ANGIOGRAPHY Left 04/12/2020   Procedure: Lower  Extremity Angiography;  Surgeon: Annice Needy, MD;  Location: [facility] INVASIVE CV LAB;  Service: Cardiovascular;  Laterality: Left;   LOWER EXTREMITY ANGIOGRAPHY Left 08/30/2021   Procedure: Lower Extremity Angiography;  Surgeon: Annice Needy, MD;  Location: [facility] INVASIVE CV LAB;  Service: Cardiovascular;  Laterality: Left;   LOWER EXTREMITY INTERVENTION Left 08/31/2021   Procedure: LOWER EXTREMITY INTERVENTION;  Surgeon: Annice Needy, MD;  Location: [facility] INVASIVE CV LAB;  Service: Cardiovascular;  Laterality: Left;   TONSILLECTOMY     TOOTH EXTRACTION  2023    Current Outpatient Medications  Medication Sig Dispense Refill   acetaminophen (TYLENOL) 500 MG tablet Take 500 mg by mouth every 6 (six) hours as needed.     albuterol (VENTOLIN HFA) 108 (90 Base) MCG/ACT inhaler Inhale 2 puffs into the lungs every 6 (six) hours as needed for shortness of breath. 8 g 0   ASPIRIN LOW DOSE 81 MG tablet TAKE 1 TABLET BY MOUTH EVERY DAY 90 tablet 3   budesonide-formoterol (SYMBICORT) 160-4.5 MCG/ACT inhaler TAKE 2 PUFFS BY MOUTH TWICE A DAY 30.6 each 2   Cyanocobalamin 2500 MCG TABS Take 1 tablet by mouth daily.     doxylamine, Sleep, (UNISOM) 25 MG tablet Take 25 mg by mouth at bedtime as needed.     ELIQUIS 5 MG TABS tablet TAKE 1 TABLET BY MOUTH TWICE A DAY 60 tablet 11   esomeprazole (NEXIUM) 20 MG capsule Take 20 mg by mouth daily as needed (heartburn).      losartan (COZAAR) 50 MG tablet TAKE 1 TABLET BY MOUTH EVERY DAY 30 tablet 0   Multiple Vitamins-Minerals (MULTIVITAMIN WITH MINERALS) tablet Take 1 tablet by mouth daily.     No current facility-administered medications for this visit.    Allergies:   Fentanyl, Prozac [fluoxetine hcl], and Statins   Social History:  The patient  reports that Nathaniel [name] quit smoking about 3 years ago. His smoking use included cigarettes. Nathaniel [name] started smoking about 53 years ago. Nathaniel [name] has a 50 pack-year smoking history. Nathaniel [name] has never used smokeless tobacco. Nathaniel [name] reports that Nathaniel [name]  does not currently use alcohol. Nathaniel [name] reports that Nathaniel [name] does not use drugs.   Family History:   family history includes Dementia in his father; Heart disease in his brother and mother; Stroke in his father.    Review of Systems: Review of Systems  Constitutional: Negative.   HENT: Negative.    Respiratory: Negative.    Cardiovascular: Negative.   Gastrointestinal: Negative.   Musculoskeletal: Negative.   Neurological: Negative.   Psychiatric/Behavioral: Negative.    All other systems reviewed and are negative.   PHYSICAL EXAM: VS:  BP (!) 150/80 (BP Location: Left Arm, Patient Position: Sitting, Cuff Size: Normal)   Pulse 96   Ht 6\' 4"  (1.93 m)   Wt 208 lb (94.3 kg)   SpO2 97%   BMI 25.32 kg/m  , BMI Body mass index is 25.32 kg/m. Constitutional:  oriented to person, place, and time. No distress.  HENT:  Head: Grossly normal Eyes:  no discharge. No scleral icterus.  Neck: No JVD, no carotid bruits  Cardiovascular: Regular rate and rhythm, no murmurs appreciated Pulmonary/Chest: Clear to auscultation bilaterally, no wheezes or rails Abdominal: Soft.  no distension.  no tenderness.  Musculoskeletal: Normal range of motion Neurological:  normal muscle tone. Coordination normal. No  atrophy Skin: Skin warm and dry Psychiatric: normal affect, pleasant  Recent Labs: 06/21/2022: ALT 14; Hemoglobin 14.3; Platelets 282.0 Repeated and verified X2.; TSH 1.06 07/26/2022: BUN 18; Creatinine, Ser 1.24; Potassium 4.3; Sodium 140    Lipid Panel Lab Results  Component Value Date   CHOL 244 (H) 06/21/2022   HDL 42.00 06/21/2022   LDLCALC 132 (H) 04/19/2021   TRIG 228.0 (H) 06/21/2022    Wt Readings from Last 3 Encounters:  04/22/23 208 lb (94.3 kg)  04/09/23 206 lb 8 oz (93.7 kg)  03/26/23 209 lb (94.8 kg)     ASSESSMENT AND PLAN:  Problem List Items Addressed This Visit       Cardiology Problems   Atherosclerosis of native arteries of extremity with intermittent claudication  (HCC) - Primary   Relevant Orders   EKG 12-Lead (Completed)   Essential hypertension   Relevant Orders   EKG 12-Lead (Completed)   Hyperlipidemia   Stenosis of carotid artery   Relevant Orders   EKG 12-Lead (Completed)     Other   Tobacco abuse   Centrilobular emphysema (HCC)   Chest discomfort   Relevant Orders   EKG 12-Lead (Completed)   Other Visit Diagnoses     PAD (peripheral artery disease) (HCC)          Peripheral arterial disease Followed by Dr. Wyn Quaker,  Prior smoking history A1c at goal reports statin myalgias, has tried 3, not interested in retrying Nathaniel [name] is willing to retry Zetia 10 mg daily Declining PCSK9 inhibitor in the past, previously discussed Leqvio  COPD Uses inhaler as needed, long smoking history No longer smoking Denies significant COPD complications  Essential hypertension Blood pressure elevated in the office today, recommend Nathaniel [name] monitor at home, may need losartan 50 twice daily Reports Nathaniel [name] checks blood pressure at home, does not feel that Nathaniel [name] needs more losartan on a regular basis  Shortness of breath Secondary to smoking history, COPD, deconditioning Prior stress test and echocardiogram reviewed, no ischemia, normal ejection fraction No significant change in symptoms  Hyperlipidemia Previously declining statins Retrial of Zetia,  Consider PCSK9 inhibitor   Total encounter time more than 30 minutes  Greater than 50% was spent in counseling and coordination of care with the patient    Signed, Dossie Arbour, M.D., Ph.D. [facility] [place], [place] [phone]

## 2023-04-22 ENCOUNTER — Ambulatory Visit: Payer: Medicare Other | Attending: Cardiovascular Disease | Admitting: Cardiovascular Disease

## 2023-04-22 ENCOUNTER — Encounter: Payer: Self-pay | Admitting: Cardiovascular Disease

## 2023-04-22 ENCOUNTER — Other Ambulatory Visit: Payer: Self-pay | Admitting: Cardiovascular Disease

## 2023-04-22 ENCOUNTER — Other Ambulatory Visit (INDEPENDENT_AMBULATORY_CARE_PROVIDER_SITE_OTHER): Payer: Self-pay | Admitting: Nurse Practitioner

## 2023-04-22 VITALS — BP 150/80 | HR 96 | Ht 76.0 in | Wt 208.0 lb

## 2023-04-22 DIAGNOSIS — J432 Centrilobular emphysema: Secondary | ICD-10-CM | POA: Insufficient documentation

## 2023-04-22 DIAGNOSIS — E785 Hyperlipidemia, unspecified: Secondary | ICD-10-CM | POA: Diagnosis not present

## 2023-04-22 DIAGNOSIS — R0789 Other chest pain: Secondary | ICD-10-CM | POA: Insufficient documentation

## 2023-04-22 DIAGNOSIS — I739 Peripheral vascular disease, unspecified: Secondary | ICD-10-CM

## 2023-04-22 DIAGNOSIS — I70212 Atherosclerosis of native arteries of extremities with intermittent claudication, left leg: Secondary | ICD-10-CM | POA: Insufficient documentation

## 2023-04-22 DIAGNOSIS — Z720 Tobacco use: Secondary | ICD-10-CM | POA: Diagnosis not present

## 2023-04-22 DIAGNOSIS — I10 Essential (primary) hypertension: Secondary | ICD-10-CM | POA: Diagnosis not present

## 2023-04-22 DIAGNOSIS — I6529 Occlusion and stenosis of unspecified carotid artery: Secondary | ICD-10-CM | POA: Insufficient documentation

## 2023-04-22 MED ORDER — EZETIMIBE 10 MG PO TABS: 10.0000 mg | ORAL_TABLET | Freq: Every day | ORAL | 11 refills | Status: DC

## 2023-04-22 NOTE — Patient Instructions (Signed)
Medication Instructions:  Zetia 10 mg daily for cholesterol  If you need a refill on your cardiac medications before your next appointment, please call your pharmacy.   Lab work: No new labs needed  Testing/Procedures: No new testing needed  Follow-Up: At [facility], you and your health needs are our priority.  As part of our continuing mission to provide you with exceptional heart care, we have created designated Provider Care Teams.  These Care Teams include your primary Cardiologist (physician) and Advanced Practice Providers (APPs -  Physician Assistants and Nurse Practitioners) who all work together to provide you with the care you need, when you need it.  You will need a follow up appointment in 12 months  Providers on your designated Care Team:   Nicolasa Ducking, NP Eula Listen, PA-C Cadence Fransico Michael, [place]  COVID-19 Vaccine Information can be found at: [email or web address] For questions related to vaccine distribution or appointments, please email [email or web address] or call [phone].

## 2023-04-30 ENCOUNTER — Ambulatory Visit (INDEPENDENT_AMBULATORY_CARE_PROVIDER_SITE_OTHER): Payer: Medicare Other | Admitting: Vascular Surgery

## 2023-04-30 ENCOUNTER — Encounter (INDEPENDENT_AMBULATORY_CARE_PROVIDER_SITE_OTHER): Payer: Self-pay

## 2023-04-30 ENCOUNTER — Ambulatory Visit (INDEPENDENT_AMBULATORY_CARE_PROVIDER_SITE_OTHER): Payer: Medicare Other

## 2023-04-30 DIAGNOSIS — I739 Peripheral vascular disease, unspecified: Secondary | ICD-10-CM | POA: Diagnosis not present

## 2023-04-30 DIAGNOSIS — Z9889 Other specified postprocedural states: Secondary | ICD-10-CM | POA: Diagnosis not present

## 2023-05-01 LAB — VAS US ABI WITH/WO TBI
Left ABI: 0.57
Right ABI: 0.98

## 2023-05-07 DIAGNOSIS — M62830 Muscle spasm of back: Secondary | ICD-10-CM | POA: Diagnosis not present

## 2023-05-07 DIAGNOSIS — M9905 Segmental and somatic dysfunction of pelvic region: Secondary | ICD-10-CM | POA: Diagnosis not present

## 2023-05-07 DIAGNOSIS — M5136 Other intervertebral disc degeneration, lumbar region with discogenic back pain only: Secondary | ICD-10-CM | POA: Diagnosis not present

## 2023-05-07 DIAGNOSIS — M9903 Segmental and somatic dysfunction of lumbar region: Secondary | ICD-10-CM | POA: Diagnosis not present

## 2023-05-15 DIAGNOSIS — H52221 Regular astigmatism, right eye: Secondary | ICD-10-CM | POA: Diagnosis not present

## 2023-05-15 DIAGNOSIS — H2513 Age-related nuclear cataract, bilateral: Secondary | ICD-10-CM | POA: Diagnosis not present

## 2023-05-15 DIAGNOSIS — H2511 Age-related nuclear cataract, right eye: Secondary | ICD-10-CM | POA: Diagnosis not present

## 2023-05-15 DIAGNOSIS — Z01818 Encounter for other preprocedural examination: Secondary | ICD-10-CM | POA: Diagnosis not present

## 2023-05-27 ENCOUNTER — Ambulatory Visit (INDEPENDENT_AMBULATORY_CARE_PROVIDER_SITE_OTHER): Payer: Medicare Other | Admitting: Gastroenterology

## 2023-05-27 ENCOUNTER — Encounter: Payer: Self-pay | Admitting: Gastroenterology

## 2023-05-27 VITALS — BP 149/93 | HR 89 | Temp 98.1°F | Wt 209.0 lb

## 2023-05-27 DIAGNOSIS — K648 Other hemorrhoids: Secondary | ICD-10-CM

## 2023-05-27 DIAGNOSIS — K640 First degree hemorrhoids: Secondary | ICD-10-CM | POA: Diagnosis not present

## 2023-05-27 NOTE — Progress Notes (Signed)
Patient follow-ups today for banding of hemorrhoids      Summary of history :   Last colonoscopy performed by myself 2 years back showed 4 sessile polyps subcentimeter internal hemorrhoids were noted. The polyps were tubular adenomas. Hemoglobin 14.3 g. He says that when he has a hard bowel movement he passes some blood in the stool and has perianal discomfort he has tried high-fiber diet perianal toileting and hygiene still has recurrence of the same issue. Would like to get his hemorrhoids banded    He is on Eliquis which has been held.  Discussed the risk versus benefits of hemorrhoidal banding including but not limited to infection bleeding pelvic sepsis syndrome and pain.   First round:04/09/2023:  RA column banded   Interval history  04/09/2023-05/27/2023  Did very well after the last round of hemorrhoidal banding.  Feels it has helped him tremendously much easier to clean up.  Digital rectal exam performed in the presence of a chaperone. External anal findings: Normal  Internal findings: Normal, No masses, no blood on glove noticed.  Chaperone present in the room  PROCEDURE NOTE: The patient presents with symptomatic grade 1 hemorrhoids, unresponsive to maximal medical therapy, requesting rubber band ligation of his/her hemorrhoidal disease.  All risks, benefits and alternative forms of therapy were described and informed consent was obtained.  In the Left Lateral Decubitus position (if anoscopy is performed) anoscopic examination revealed grade 1 hemorrhoids in the RP and LL position(s).   The decision was made to band the LL internal hemorrhoid, and the [facility] O'Regan System was used to perform band ligation without complication.  Digital anorectal examination was then performed to assure proper positioning of the band, and to adjust the banded tissue as required.  The patient was discharged home without pain or other issues.  Dietary and behavioral recommendations were given and (if  necessary - prescriptions were given), along with follow-up instructions.  The patient will return 4 weeks for follow-up and possible additional banding as required.  No complications were encountered and the patient tolerated the procedure well.   Plan:  Avoid constipation.    Follow-up: 4 weeks  Dr Wyline Mood MD,MRCP [facility]) Gastroenterology/Hepatology Pager: [phone]

## 2023-06-04 DIAGNOSIS — M9903 Segmental and somatic dysfunction of lumbar region: Secondary | ICD-10-CM | POA: Diagnosis not present

## 2023-06-04 DIAGNOSIS — M62830 Muscle spasm of back: Secondary | ICD-10-CM | POA: Diagnosis not present

## 2023-06-04 DIAGNOSIS — M5136 Other intervertebral disc degeneration, lumbar region with discogenic back pain only: Secondary | ICD-10-CM | POA: Diagnosis not present

## 2023-06-04 DIAGNOSIS — M9905 Segmental and somatic dysfunction of pelvic region: Secondary | ICD-10-CM | POA: Diagnosis not present

## 2023-06-06 ENCOUNTER — Other Ambulatory Visit (INDEPENDENT_AMBULATORY_CARE_PROVIDER_SITE_OTHER): Payer: Self-pay | Admitting: Nurse Practitioner

## 2023-06-07 DIAGNOSIS — H2511 Age-related nuclear cataract, right eye: Secondary | ICD-10-CM | POA: Diagnosis not present

## 2023-06-14 ENCOUNTER — Encounter: Payer: Self-pay | Admitting: Primary Care

## 2023-06-14 ENCOUNTER — Ambulatory Visit (INDEPENDENT_AMBULATORY_CARE_PROVIDER_SITE_OTHER): Payer: Medicare Other | Admitting: Primary Care

## 2023-06-14 VITALS — BP 138/80 | HR 96 | Temp 97.6°F | Ht 76.0 in | Wt 209.0 lb

## 2023-06-14 DIAGNOSIS — I10 Essential (primary) hypertension: Secondary | ICD-10-CM | POA: Diagnosis not present

## 2023-06-14 DIAGNOSIS — K219 Gastro-esophageal reflux disease without esophagitis: Secondary | ICD-10-CM

## 2023-06-14 DIAGNOSIS — R7303 Prediabetes: Secondary | ICD-10-CM | POA: Diagnosis not present

## 2023-06-14 DIAGNOSIS — Z125 Encounter for screening for malignant neoplasm of prostate: Secondary | ICD-10-CM | POA: Diagnosis not present

## 2023-06-14 DIAGNOSIS — J432 Centrilobular emphysema: Secondary | ICD-10-CM

## 2023-06-14 DIAGNOSIS — I70212 Atherosclerosis of native arteries of extremities with intermittent claudication, left leg: Secondary | ICD-10-CM | POA: Diagnosis not present

## 2023-06-14 DIAGNOSIS — E785 Hyperlipidemia, unspecified: Secondary | ICD-10-CM | POA: Diagnosis not present

## 2023-06-14 DIAGNOSIS — Z720 Tobacco use: Secondary | ICD-10-CM

## 2023-06-14 LAB — HEMOGLOBIN A1C: Hgb A1c MFr Bld: 6 % (ref 4.6–6.5)

## 2023-06-14 LAB — COMPREHENSIVE METABOLIC PANEL
ALT: 13 U/L (ref 0–53)
AST: 14 U/L (ref 0–37)
Albumin: 4.2 g/dL (ref 3.5–5.2)
Alkaline Phosphatase: 117 U/L (ref 39–117)
BUN: 17 mg/dL (ref 6–23)
CO2: 31 meq/L (ref 19–32)
Calcium: 9.6 mg/dL (ref 8.4–10.5)
Chloride: 100 meq/L (ref 96–112)
Creatinine, Ser: 1.36 mg/dL (ref 0.40–1.50)
GFR: 51.31 mL/min — ABNORMAL LOW (ref >60.00)
Glucose, Bld: 90 mg/dL (ref 70–99)
Potassium: 4 meq/L (ref 3.5–5.1)
Sodium: 138 meq/L (ref 135–145)
Total Bilirubin: 0.6 mg/dL (ref 0.2–1.2)
Total Protein: 6.9 g/dL (ref 6.0–8.3)

## 2023-06-14 LAB — LIPID PANEL
Cholesterol: 221 mg/dL — ABNORMAL HIGH (ref 0–200)
HDL: 41.4 mg/dL (ref >39.00)
LDL Cholesterol: 129 mg/dL — ABNORMAL HIGH (ref 0–99)
NonHDL: 179.76
Total CHOL/HDL Ratio: 5
Triglycerides: 254 mg/dL — ABNORMAL HIGH (ref 0.0–149.0)
VLDL: 50.8 mg/dL — ABNORMAL HIGH (ref 0.0–40.0)

## 2023-06-14 LAB — PSA, MEDICARE: PSA: 1.33 ng/mL (ref 0.10–4.00)

## 2023-06-14 NOTE — Assessment & Plan Note (Signed)
Repeat A1c pending

## 2023-06-14 NOTE — Assessment & Plan Note (Signed)
Following with vascular surgery, reviewed office notes from April 2024.  Continue Eliquis 5 mg BID, aspirin 81 mg daily, Zetia 10 mg daily. Lipid panel pending.

## 2023-06-14 NOTE — Assessment & Plan Note (Signed)
Improved.  Continue losartan 50 mg daily. CMP pending.

## 2023-06-14 NOTE — Assessment & Plan Note (Signed)
Lung cancer screening CT scan reviewed from August 2024. Continue annual screenings.

## 2023-06-14 NOTE — Progress Notes (Signed)
Subjective:    Patient ID: Nathaniel [name], male    DOB: August 11, 1950, 72 y.o.   MRN: [id number]  HPI  Nathaniel [name] is a very pleasant 72 y.o. male with a history of atherosclerosis with claudication, left lower extremity ischemia, carotid artery stenosis, hypertension, emphysema, GERD, tremor of hands, hyperlipidemia, prediabetes who presents today for follow-up of chronic conditions.  1) Atherosclerosis/Hypertension/Hyperlipidemia: Currently managed on aspirin 81 mg daily, Zetia 10 mg daily, losartan 50 mg daily, Eliquis 5 mg twice daily.  Following with cardiology and vascular surgery.  Last office visit with cardiology was in September 2024, Zetia 10 mg daily was restarted.  Blood pressure was above goal in the office that day. Today he mentions that he is taking Zetia daily and has no side effects  Last office visit with vascular surgery was in April 2024.  During this visit ABIs had improved bilaterally.  No changes made to regimen.  He has noticed headaches but suspects this is from eye strain. Had cataracts removed last week.   BP Readings from Last 3 Encounters:  06/14/23 138/80  05/27/23 (!) 149/93  04/22/23 (!) 150/80   2) COPD/Emphysema: Currently managed on Symbicort 160-4.5 mcg, 2 puffs BID and albuterol inhaler PRN. Overall feels well managed on his regimen. Is actually taking Symbicort 2 puffs once daily.   3) GERD: Currently managed on Nexium 20 mg daily. Feels well managed on this regimen.    Immunizations: -Influenza: Declines today -Shingles: never completed -Pneumonia: never completed   Colonoscopy: Completed in 2022, due 2025 Lung Cancer Screening: Completed in August 2024  PSA: Due      Review of Systems  Eyes:  Positive for visual disturbance.  Respiratory:  Negative for shortness of breath.   Cardiovascular:  Negative for chest pain.  Gastrointestinal:  Negative for constipation and diarrhea.  Neurological:  Positive for headaches.  Negative for numbness.  Psychiatric/Behavioral:  The patient is not nervous/anxious.          Past Medical History:  Diagnosis Date   Cataract    Chicken pox    GERD (gastroesophageal reflux disease)    Hyperlipidemia    PAD (peripheral artery disease) (HCC)    Pain in both lower extremities 11/23/2016   Peripheral vascular disease (HCC)    Pre-diabetes    Seizures (HCC)    None since age 49    Social History   Socioeconomic History   Marital status: Married    Spouse name: Not on file   Number of children: Not on file   Years of education: Not on file   Highest education level: Associate degree: occupational, Scientist, product/process development, or vocational program  Occupational History   Not on file  Tobacco Use   Smoking status: Former    Current packs/day: 0.00    Average packs/day: 1 pack/day for 50.0 years (50.0 ttl pk-yrs)    Types: Cigarettes    Start date: 11/20/1969    Quit date: 11/21/2019    Years since quitting: 3.5   Smokeless tobacco: Never  Vaping Use   Vaping status: Never Used  Substance and Sexual Activity   Alcohol use: Not Currently    Alcohol/week: 0.0 standard drinks of alcohol   Drug use: No   Sexual activity: Not on file  Other Topics Concern   Not on file  Social History Narrative   Retired.   Stays busy with household work, Programmer, applications.   Married for 3 years.   Grown children.   Enjoys  reading.    Social Determinants of Health   Financial Resource Strain: Low Risk  (06/13/2023)   Overall Financial Resource Strain (CARDIA)    Difficulty of Paying Living Expenses: Not hard at all  Food Insecurity: No Food Insecurity (06/13/2023)   Hunger Vital Sign    Worried About Running Out of Food in the Last Year: Never true    Ran Out of Food in the Last Year: Never true  Transportation Needs: No Transportation Needs (06/13/2023)   PRAPARE - Administrator, Civil Service (Medical): No    Lack of Transportation (Non-Medical): No  Physical Activity:  Sufficiently Active (06/13/2023)   Exercise Vital Sign    Days of Exercise per Week: 6 days    Minutes of Exercise per Session: 40 min  Stress: No Stress Concern Present (06/13/2023)   [employer] of Occupational Health - Occupational Stress Questionnaire    Feeling of Stress : Not at all  Social Connections: Moderately Integrated (06/13/2023)   Social Connection and Isolation Panel [NHANES]    Frequency of Communication with Friends and Family: Twice a week    Frequency of Social Gatherings with Friends and Family: Once a week    Attends Religious Services: Never    Database administrator or Organizations: No    Attends Engineer, structural: More than 4 times per year    Marital Status: Married  Catering manager Violence: Not At Risk (11/07/2022)   Humiliation, Afraid, Rape, and Kick questionnaire    Fear of Current or Ex-Partner: No    Emotionally Abused: No    Physically Abused: No    Sexually Abused: No    Past Surgical History:  Procedure Laterality Date   BACK SURGERY     COLONOSCOPY WITH PROPOFOL N/A 02/07/2021   Procedure: COLONOSCOPY WITH PROPOFOL;  Surgeon: Wyline Mood, MD;  Location: [facility] ENDOSCOPY;  Service: Gastroenterology;  Laterality: N/A;   LAMINECTOMY  1987   LOWER EXTREMITY ANGIOGRAPHY Left 05/02/2017   Procedure: Lower Extremity Angiography;  Surgeon: Annice Needy, MD;  Location: [facility] INVASIVE CV LAB;  Service: Cardiovascular;  Laterality: Left;   LOWER EXTREMITY ANGIOGRAPHY Left 11/16/2019   Procedure: LOWER EXTREMITY ANGIOGRAPHY;  Surgeon: Annice Needy, MD;  Location: [facility] INVASIVE CV LAB;  Service: Cardiovascular;  Laterality: Left;   LOWER EXTREMITY ANGIOGRAPHY Left 04/11/2020   Procedure: LOWER EXTREMITY ANGIOGRAPHY;  Surgeon: Annice Needy, MD;  Location: [facility] INVASIVE CV LAB;  Service: Cardiovascular;  Laterality: Left;   LOWER EXTREMITY ANGIOGRAPHY Left 04/12/2020   Procedure: Lower Extremity Angiography;  Surgeon: Annice Needy, MD;   Location: [facility] INVASIVE CV LAB;  Service: Cardiovascular;  Laterality: Left;   LOWER EXTREMITY ANGIOGRAPHY Left 08/30/2021   Procedure: Lower Extremity Angiography;  Surgeon: Annice Needy, MD;  Location: [facility] INVASIVE CV LAB;  Service: Cardiovascular;  Laterality: Left;   LOWER EXTREMITY INTERVENTION Left 08/31/2021   Procedure: LOWER EXTREMITY INTERVENTION;  Surgeon: Annice Needy, MD;  Location: [facility] INVASIVE CV LAB;  Service: Cardiovascular;  Laterality: Left;   TONSILLECTOMY     TOOTH EXTRACTION  2023    Family History  Problem Relation Age of Onset   Heart disease Mother        Arrthymia    Stroke Father        Deceased   Dementia Father    Heart disease Brother        Myocardial infarction   Colon cancer Neg Hx     Allergies  Allergen Reactions   Fentanyl Other (See Comments)    chills   Prozac [Fluoxetine Hcl] Other (See Comments)    Extreme depression   Statins Other (See Comments)    Flu like symptoms    Current Outpatient Medications on File Prior to Visit  Medication Sig Dispense Refill   acetaminophen (TYLENOL) 500 MG tablet Take 500 mg by mouth every 6 (six) hours as needed.     albuterol (VENTOLIN HFA) 108 (90 Base) MCG/ACT inhaler Inhale 2 puffs into the lungs every 6 (six) hours as needed for shortness of breath. 8 g 0   ASPIRIN LOW DOSE 81 MG tablet TAKE 1 TABLET BY MOUTH EVERY DAY 90 tablet 3   budesonide-formoterol (SYMBICORT) 160-4.5 MCG/ACT inhaler TAKE 2 PUFFS BY MOUTH TWICE A DAY 30.6 each 2   Cyanocobalamin 2500 MCG TABS Take 1 tablet by mouth daily.     doxylamine, Sleep, (UNISOM) 25 MG tablet Take 25 mg by mouth at bedtime as needed.     ELIQUIS 5 MG TABS tablet TAKE 1 TABLET BY MOUTH TWICE A DAY 60 tablet 11   esomeprazole (NEXIUM) 20 MG capsule Take 20 mg by mouth daily as needed (heartburn).      ezetimibe (ZETIA) 10 MG tablet Take 1 tablet (10 mg total) by mouth daily. 30 tablet 11   losartan (COZAAR) 50 MG tablet TAKE 1 TABLET BY MOUTH EVERY DAY  90 tablet 1   Multiple Vitamins-Minerals (MULTIVITAMIN WITH MINERALS) tablet Take 1 tablet by mouth daily.     No current facility-administered medications on file prior to visit.    BP 138/80 (BP Location: Left Arm, Patient Position: Sitting, Cuff Size: Normal)   Pulse 96   Temp 97.6 F (36.4 C) (Oral)   Ht 6\' 4"  (1.93 m)   Wt 209 lb (94.8 kg)   SpO2 97%   BMI 25.44 kg/m  Objective:   Physical Exam Cardiovascular:     Rate and Rhythm: Normal rate and regular rhythm.  Pulmonary:     Effort: Pulmonary effort is normal.     Breath sounds: Normal breath sounds.  Musculoskeletal:     Cervical back: Neck supple.  Skin:    General: Skin is warm and dry.  Neurological:     Mental Status: He is alert and oriented to person, place, and time.  Psychiatric:        Mood and Affect: Mood normal.           Assessment & Plan:  Essential hypertension Assessment & Plan: Improved.  Continue losartan 50 mg daily. CMP pending.   Orders: -     Comprehensive metabolic panel  Atherosclerosis of native artery of left lower extremity with intermittent claudication [facility]) Assessment & Plan: Following with vascular surgery, reviewed office notes from April 2024.  Continue Eliquis 5 mg BID, aspirin 81 mg daily, Zetia 10 mg daily. Lipid panel pending.   Centrilobular emphysema (HCC) Assessment & Plan: Stable.  Continue Symbicort 160-4.5 mcg, 2 puffs 1-2 times daily. Continue albuterol inhaler as needed.   Gastroesophageal reflux disease, unspecified whether esophagitis present Assessment & Plan: Controlled.  Continue Nexium 20 mg daily.   Hyperlipidemia, unspecified hyperlipidemia type Assessment & Plan: Repeat lipid panel pending.  Continue Zetia 10 mg daily.  Orders: -     Lipid panel  Prediabetes Assessment & Plan: Repeat A1c pending.  Orders: -     Hemoglobin A1c  Tobacco abuse Assessment & Plan: Lung cancer screening CT scan reviewed from August  2024.  Continue annual screenings.   Screening for prostate cancer -     PSA, Medicare        Doreene Nest, NP

## 2023-06-14 NOTE — Assessment & Plan Note (Signed)
Controlled.  Continue Nexium 20 mg daily.

## 2023-06-14 NOTE — Patient Instructions (Signed)
Stop by the lab prior to leaving today. I will notify you of your results once received.   It was a pleasure to see you today!  

## 2023-06-14 NOTE — Assessment & Plan Note (Signed)
Repeat lipid panel pending.  Continue Zetia 10 mg daily.

## 2023-06-14 NOTE — Assessment & Plan Note (Signed)
Stable.  Continue Symbicort 160-4.5 mcg, 2 puffs 1-2 times daily. Continue albuterol inhaler as needed.

## 2023-06-28 DIAGNOSIS — Z87891 Personal history of nicotine dependence: Secondary | ICD-10-CM | POA: Diagnosis not present

## 2023-06-28 DIAGNOSIS — I10 Essential (primary) hypertension: Secondary | ICD-10-CM | POA: Diagnosis not present

## 2023-06-28 DIAGNOSIS — H52222 Regular astigmatism, left eye: Secondary | ICD-10-CM | POA: Diagnosis not present

## 2023-06-28 DIAGNOSIS — H2512 Age-related nuclear cataract, left eye: Secondary | ICD-10-CM | POA: Diagnosis not present

## 2023-06-28 DIAGNOSIS — H25812 Combined forms of age-related cataract, left eye: Secondary | ICD-10-CM | POA: Diagnosis not present

## 2023-07-01 ENCOUNTER — Ambulatory Visit (INDEPENDENT_AMBULATORY_CARE_PROVIDER_SITE_OTHER): Payer: Medicare Other | Admitting: Gastroenterology

## 2023-07-01 ENCOUNTER — Encounter: Payer: Self-pay | Admitting: Gastroenterology

## 2023-07-01 VITALS — BP 152/72 | HR 87 | Temp 98.7°F | Wt 207.4 lb

## 2023-07-01 DIAGNOSIS — K648 Other hemorrhoids: Secondary | ICD-10-CM

## 2023-07-01 DIAGNOSIS — K640 First degree hemorrhoids: Secondary | ICD-10-CM

## 2023-07-01 DIAGNOSIS — Z860101 Personal history of adenomatous and serrated colon polyps: Secondary | ICD-10-CM | POA: Diagnosis not present

## 2023-07-01 DIAGNOSIS — K644 Residual hemorrhoidal skin tags: Secondary | ICD-10-CM | POA: Diagnosis not present

## 2023-07-01 NOTE — Progress Notes (Signed)
Patient follow-ups today for banding of hemorrhoids    Summary of history : Summary of history :   Last colonoscopy performed by myself 2 years back showed 4 sessile polyps subcentimeter internal hemorrhoids were noted. The polyps were tubular adenomas. Hemoglobin 14.3 g. He says that when he has a hard bowel movement he passes some blood in the stool and has perianal discomfort he has tried high-fiber diet perianal toileting and hygiene still has recurrence of the same issue. Would like to get his hemorrhoids banded    He is on Eliquis which has been held.  Discussed the risk versus benefits of hemorrhoidal banding including but not limited to infection bleeding pelvic sepsis syndrome and pain.   First round:04/09/2023:  RA column banded  Second round: 05/27/2023: LL column banded  Did well after last episode of hemorrhoidal banding no complaints    Digital rectal exam performed in the presence of a chaperone. External anal findings: Skin tag present chaperone present in the room CMA Maritza Internal findings: , No masses, no blood on glove noticed.    PROCEDURE NOTE: The patient presents with symptomatic grade 1 hemorrhoids, unresponsive to maximal medical therapy, requesting rubber band ligation of his/her hemorrhoidal disease.  All risks, benefits and alternative forms of therapy were described and informed consent was obtained.  In the Left Lateral Decubitus position (if anoscopy is performed) anoscopic examination revealed grade 1 hemorrhoids in the RP position(s).   The decision was made to band the RP internal hemorrhoid, and the [facility] O'Regan System was used to perform band ligation without complication.  Digital anorectal examination was then performed to assure proper positioning of the band, and to adjust the banded tissue as required.  The patient was discharged home without pain or other issues.  Dietary and behavioral recommendations were given and (if necessary -  prescriptions were given), along with follow-up instructions.  The patient will return     as needed for follow-up and possible additional banding as required.  No complications were encountered and the patient tolerated the procedure well.   Plan:  Avoid constipation.    Follow-up:as needed  Dr Wyline Mood MD,MRCP [facility]) Gastroenterology/Hepatology Pager: [phone]

## 2023-07-02 DIAGNOSIS — M9905 Segmental and somatic dysfunction of pelvic region: Secondary | ICD-10-CM | POA: Diagnosis not present

## 2023-07-02 DIAGNOSIS — M5136 Other intervertebral disc degeneration, lumbar region with discogenic back pain only: Secondary | ICD-10-CM | POA: Diagnosis not present

## 2023-07-02 DIAGNOSIS — M62830 Muscle spasm of back: Secondary | ICD-10-CM | POA: Diagnosis not present

## 2023-07-02 DIAGNOSIS — M9903 Segmental and somatic dysfunction of lumbar region: Secondary | ICD-10-CM | POA: Diagnosis not present

## 2023-07-08 DIAGNOSIS — B9689 Other specified bacterial agents as the cause of diseases classified elsewhere: Secondary | ICD-10-CM | POA: Diagnosis not present

## 2023-07-08 DIAGNOSIS — R051 Acute cough: Secondary | ICD-10-CM | POA: Diagnosis not present

## 2023-07-08 DIAGNOSIS — J029 Acute pharyngitis, unspecified: Secondary | ICD-10-CM | POA: Diagnosis not present

## 2023-07-08 DIAGNOSIS — Z03818 Encounter for observation for suspected exposure to other biological agents ruled out: Secondary | ICD-10-CM | POA: Diagnosis not present

## 2023-07-08 DIAGNOSIS — J441 Chronic obstructive pulmonary disease with (acute) exacerbation: Secondary | ICD-10-CM | POA: Diagnosis not present

## 2023-07-08 DIAGNOSIS — R509 Fever, unspecified: Secondary | ICD-10-CM | POA: Diagnosis not present

## 2023-07-08 DIAGNOSIS — J188 Other pneumonia, unspecified organism: Secondary | ICD-10-CM | POA: Diagnosis not present

## 2023-07-08 DIAGNOSIS — J019 Acute sinusitis, unspecified: Secondary | ICD-10-CM | POA: Diagnosis not present

## 2023-07-30 DIAGNOSIS — M5136 Other intervertebral disc degeneration, lumbar region with discogenic back pain only: Secondary | ICD-10-CM | POA: Diagnosis not present

## 2023-07-30 DIAGNOSIS — M62830 Muscle spasm of back: Secondary | ICD-10-CM | POA: Diagnosis not present

## 2023-07-30 DIAGNOSIS — M9903 Segmental and somatic dysfunction of lumbar region: Secondary | ICD-10-CM | POA: Diagnosis not present

## 2023-07-30 DIAGNOSIS — M9905 Segmental and somatic dysfunction of pelvic region: Secondary | ICD-10-CM | POA: Diagnosis not present

## 2023-08-27 DIAGNOSIS — M5136 Other intervertebral disc degeneration, lumbar region with discogenic back pain only: Secondary | ICD-10-CM | POA: Diagnosis not present

## 2023-08-27 DIAGNOSIS — M9905 Segmental and somatic dysfunction of pelvic region: Secondary | ICD-10-CM | POA: Diagnosis not present

## 2023-08-27 DIAGNOSIS — M62830 Muscle spasm of back: Secondary | ICD-10-CM | POA: Diagnosis not present

## 2023-08-27 DIAGNOSIS — M9903 Segmental and somatic dysfunction of lumbar region: Secondary | ICD-10-CM | POA: Diagnosis not present

## 2023-09-24 DIAGNOSIS — M62830 Muscle spasm of back: Secondary | ICD-10-CM | POA: Diagnosis not present

## 2023-09-24 DIAGNOSIS — M9905 Segmental and somatic dysfunction of pelvic region: Secondary | ICD-10-CM | POA: Diagnosis not present

## 2023-09-24 DIAGNOSIS — M9903 Segmental and somatic dysfunction of lumbar region: Secondary | ICD-10-CM | POA: Diagnosis not present

## 2023-09-24 DIAGNOSIS — M5136 Other intervertebral disc degeneration, lumbar region with discogenic back pain only: Secondary | ICD-10-CM | POA: Diagnosis not present

## 2023-10-09 DIAGNOSIS — H43812 Vitreous degeneration, left eye: Secondary | ICD-10-CM | POA: Diagnosis not present

## 2023-10-15 ENCOUNTER — Other Ambulatory Visit: Payer: Self-pay | Admitting: Cardiovascular Disease

## 2023-10-22 DIAGNOSIS — M9905 Segmental and somatic dysfunction of pelvic region: Secondary | ICD-10-CM | POA: Diagnosis not present

## 2023-10-22 DIAGNOSIS — M62830 Muscle spasm of back: Secondary | ICD-10-CM | POA: Diagnosis not present

## 2023-10-22 DIAGNOSIS — M5136 Other intervertebral disc degeneration, lumbar region with discogenic back pain only: Secondary | ICD-10-CM | POA: Diagnosis not present

## 2023-10-22 DIAGNOSIS — M9903 Segmental and somatic dysfunction of lumbar region: Secondary | ICD-10-CM | POA: Diagnosis not present

## 2023-10-25 ENCOUNTER — Other Ambulatory Visit (INDEPENDENT_AMBULATORY_CARE_PROVIDER_SITE_OTHER): Payer: Self-pay | Admitting: Nurse Practitioner

## 2023-10-25 DIAGNOSIS — I70212 Atherosclerosis of native arteries of extremities with intermittent claudication, left leg: Secondary | ICD-10-CM

## 2023-10-29 ENCOUNTER — Ambulatory Visit (INDEPENDENT_AMBULATORY_CARE_PROVIDER_SITE_OTHER): Payer: Medicare Other | Admitting: Vascular Surgery

## 2023-10-29 ENCOUNTER — Encounter (INDEPENDENT_AMBULATORY_CARE_PROVIDER_SITE_OTHER): Payer: Self-pay | Admitting: Vascular Surgery

## 2023-10-29 ENCOUNTER — Ambulatory Visit (INDEPENDENT_AMBULATORY_CARE_PROVIDER_SITE_OTHER): Payer: Medicare Other

## 2023-10-29 VITALS — BP 144/83 | HR 82 | Resp 18 | Ht 76.0 in | Wt 211.0 lb

## 2023-10-29 DIAGNOSIS — I6523 Occlusion and stenosis of bilateral carotid arteries: Secondary | ICD-10-CM

## 2023-10-29 DIAGNOSIS — E785 Hyperlipidemia, unspecified: Secondary | ICD-10-CM

## 2023-10-29 DIAGNOSIS — I70212 Atherosclerosis of native arteries of extremities with intermittent claudication, left leg: Secondary | ICD-10-CM

## 2023-10-29 DIAGNOSIS — I10 Essential (primary) hypertension: Secondary | ICD-10-CM

## 2023-10-29 NOTE — Assessment & Plan Note (Signed)
 His ABIs today are stable and normal on the right 1.17 with triphasic waveforms.  His ABI on the left is improved to 0.65 with biphasic waveforms.  His previous ABI was 0.57.  His TBI on the left is now 0.49 which is also improved from 0.38 previously. Symptoms of gotten much better with smoking cessation and exercise regimen.  I would continue these.  Follow-up in 6 months with noninvasive studies.

## 2023-10-29 NOTE — Progress Notes (Signed)
 MRN : [id number]  Nathaniel [name] is a 73 y.o. (1951/03/21) male who presents with chief complaint of  Chief Complaint  Patient presents with   Follow-up    6 month ABI + see jd (JD in surgery at last visit)  .  History of Present Illness: Patient returns today in follow up of his PAD.  He is doing quite well.  He has stopped smoking and has been walking diligently.  He can now walk over 1000 feet without having to stop due to pain in his legs.  He is in good spirits today.  His ABIs today are stable and normal on the right 1.17 with triphasic waveforms.  His ABI on the left is improved to 0.65 with biphasic waveforms.  His previous ABI was 0.57.  His TBI on the left is now 0.49 which is also improved from 0.38 previously.  Current Outpatient Medications  Medication Sig Dispense Refill   acetaminophen (TYLENOL) 500 MG tablet Take 500 mg by mouth every 6 (six) hours as needed.     albuterol (VENTOLIN HFA) 108 (90 Base) MCG/ACT inhaler Inhale 2 puffs into the lungs every 6 (six) hours as needed for shortness of breath. 8 g 0   ASPIRIN LOW DOSE 81 MG tablet TAKE 1 TABLET BY MOUTH EVERY DAY 90 tablet 3   budesonide-formoterol (SYMBICORT) 160-4.5 MCG/ACT inhaler TAKE 2 PUFFS BY MOUTH TWICE A DAY 30.6 each 2   Cyanocobalamin 2500 MCG TABS Take 1 tablet by mouth daily.     doxylamine, Sleep, (UNISOM) 25 MG tablet Take 25 mg by mouth at bedtime as needed.     ELIQUIS 5 MG TABS tablet TAKE 1 TABLET BY MOUTH TWICE A DAY 60 tablet 11   esomeprazole (NEXIUM) 20 MG capsule Take 20 mg by mouth daily as needed (heartburn).      ezetimibe (ZETIA) 10 MG tablet Take 1 tablet (10 mg total) by mouth daily. 30 tablet 11   losartan (COZAAR) 50 MG tablet TAKE 1 TABLET BY MOUTH EVERY DAY 90 tablet 3   Multiple Vitamins-Minerals (MULTIVITAMIN WITH MINERALS) tablet Take 1 tablet by mouth daily.     No current facility-administered medications for this visit.    Past Medical History:  Diagnosis Date    Cataract    Chicken pox    GERD (gastroesophageal reflux disease)    Hyperlipidemia    PAD (peripheral artery disease) (HCC)    Pain in both lower extremities 11/23/2016   Peripheral vascular disease (HCC)    Pre-diabetes    Seizures (HCC)    None since age 30    Past Surgical History:  Procedure Laterality Date   BACK SURGERY     COLONOSCOPY WITH PROPOFOL N/A 02/07/2021   Procedure: COLONOSCOPY WITH PROPOFOL;  Surgeon: Wyline Mood, MD;  Location: [facility] ENDOSCOPY;  Service: Gastroenterology;  Laterality: N/A;   LAMINECTOMY  1987   LOWER EXTREMITY ANGIOGRAPHY Left 05/02/2017   Procedure: Lower Extremity Angiography;  Surgeon: Annice Needy, MD;  Location: [facility] INVASIVE CV LAB;  Service: Cardiovascular;  Laterality: Left;   LOWER EXTREMITY ANGIOGRAPHY Left 11/16/2019   Procedure: LOWER EXTREMITY ANGIOGRAPHY;  Surgeon: Annice Needy, MD;  Location: [facility] INVASIVE CV LAB;  Service: Cardiovascular;  Laterality: Left;   LOWER EXTREMITY ANGIOGRAPHY Left 04/11/2020   Procedure: LOWER EXTREMITY ANGIOGRAPHY;  Surgeon: Annice Needy, MD;  Location: [facility] INVASIVE CV LAB;  Service: Cardiovascular;  Laterality: Left;   LOWER EXTREMITY ANGIOGRAPHY Left 04/12/2020   Procedure: Lower Extremity  Angiography;  Surgeon: Annice Needy, MD;  Location: [facility] INVASIVE CV LAB;  Service: Cardiovascular;  Laterality: Left;   LOWER EXTREMITY ANGIOGRAPHY Left 08/30/2021   Procedure: Lower Extremity Angiography;  Surgeon: Annice Needy, MD;  Location: [facility] INVASIVE CV LAB;  Service: Cardiovascular;  Laterality: Left;   LOWER EXTREMITY INTERVENTION Left 08/31/2021   Procedure: LOWER EXTREMITY INTERVENTION;  Surgeon: Annice Needy, MD;  Location: [facility] INVASIVE CV LAB;  Service: Cardiovascular;  Laterality: Left;   TONSILLECTOMY     TOOTH EXTRACTION  2023     Social History   Tobacco Use   Smoking status: Former    Current packs/day: 0.00    Average packs/day: 1 pack/day for 50.0 years (50.0 ttl pk-yrs)    Types:  Cigarettes    Start date: 11/20/1969    Quit date: 11/21/2019    Years since quitting: 3.9   Smokeless tobacco: Never  Vaping Use   Vaping status: Never Used  Substance Use Topics   Alcohol use: Not Currently    Alcohol/week: 0.0 standard drinks of alcohol   Drug use: No       Family History  Problem Relation Age of Onset   Heart disease Mother        Arrthymia    Stroke Father        Deceased   Dementia Father    Heart disease Brother        Myocardial infarction   Colon cancer Neg Hx      Allergies  Allergen Reactions   Fentanyl Other (See Comments)    chills   Prozac [Fluoxetine Hcl] Other (See Comments)    Extreme depression   Statins Other (See Comments)    Flu like symptoms     REVIEW OF SYSTEMS (Negative unless checked)   Constitutional: [] Weight loss  [] Fever  [] Chills Cardiac: [] Chest pain   [] Chest pressure   [] Palpitations   [] Shortness of breath when laying flat   [] Shortness of breath at rest   [] Shortness of breath with exertion. Vascular:  [x] Pain in legs with walking   [] Pain in legs at rest   [] Pain in legs when laying flat   [x] Claudication   [] Pain in feet when walking  [] Pain in feet at rest  [] Pain in feet when laying flat   [] History of DVT   [] Phlebitis   [] Swelling in legs   [] Varicose veins   [] Non-healing ulcers Pulmonary:   [] Uses home oxygen   [] Productive cough   [] Hemoptysis   [] Wheeze  [] COPD   [] Asthma Neurologic:  [] Dizziness  [] Blackouts   [x] Seizures   [] History of stroke   [] History of TIA  [] Aphasia   [] Temporary blindness   [] Dysphagia   [] Weakness or numbness in arms   [] Weakness or numbness in legs Musculoskeletal:  [x] Arthritis   [] Joint swelling   [] Joint pain   [] Low back pain Hematologic:  [] Easy bruising  [] Easy bleeding   [] Hypercoagulable state   [] Anemic   Gastrointestinal:  [] Blood in stool   [] Vomiting blood  [x] Gastroesophageal reflux/heartburn   [] Abdominal pain Genitourinary:  [] Chronic kidney disease   [] Difficult  urination  [] Frequent urination  [] Burning with urination   [] Hematuria Skin:  [] Rashes   [] Ulcers   [] Wounds Psychological:  [] History of anxiety   []  History of major depression.  Physical Examination  BP (!) 144/83   Pulse 82   Resp 18   Ht 6\' 4"  (1.93 m)   Wt 211 lb (95.7 kg)   BMI 25.68 kg/m  Gen:  WD/WN, NAD Head: /AT, No temporalis wasting. Ear/Nose/Throat: Hearing grossly intact, nares w/o erythema or drainage Eyes: Conjunctiva clear. Sclera non-icteric Neck: Supple.  Trachea midline Pulmonary:  Good air movement, no use of accessory muscles.  Cardiac: RRR, no JVD Vascular:  Vessel Right Left  Radial Palpable Palpable                          PT 2+ Palpable 1+ Palpable  DP 2+ Palpable 1+ Palpable   Gastrointestinal: soft, non-tender/non-distended. No guarding/reflex.  Musculoskeletal: M/S 5/5 throughout.  No deformity or atrophy. No edema. Neurologic: Sensation grossly intact in extremities.  Symmetrical.  Speech is fluent.  Psychiatric: Judgment intact, Mood & affect appropriate for pt's clinical situation. Dermatologic: No rashes or ulcers noted.  No cellulitis or open wounds.      Labs No results found for this or any previous visit (from the past 2160 hours).  Radiology No results found.  Assessment/Plan  Atherosclerosis of native arteries of extremity with intermittent claudication (HCC) His ABIs today are stable and normal on the right 1.17 with triphasic waveforms.  His ABI on the left is improved to 0.65 with biphasic waveforms.  His previous ABI was 0.57.  His TBI on the left is now 0.49 which is also improved from 0.38 previously. Symptoms of gotten much better with smoking cessation and exercise regimen.  I would continue these.  Follow-up in 6 months with noninvasive studies.  Hyperlipidemia lipid control important in reducing the progression of atherosclerotic disease. Continue statin therapy     Stenosis of carotid artery Very mild 2  years ago.     Essential hypertension blood pressure control important in reducing the progression of atherosclerotic disease. On appropriate oral medications.  Festus Barren, MD  10/29/2023 12:38 PM    This note was created with Dragon medical transcription system.  Any errors from dictation are purely unintentional

## 2023-10-31 LAB — VAS US ABI WITH/WO TBI
Left ABI: 0.65
Right ABI: 1.17

## 2023-11-11 ENCOUNTER — Ambulatory Visit (INDEPENDENT_AMBULATORY_CARE_PROVIDER_SITE_OTHER): Payer: Medicare Other

## 2023-11-11 VITALS — BP 144/83 | Ht 76.0 in | Wt 220.0 lb

## 2023-11-11 DIAGNOSIS — Z0000 Encounter for general adult medical examination without abnormal findings: Secondary | ICD-10-CM

## 2023-11-11 NOTE — Progress Notes (Signed)
 Because this visit was a virtual/telehealth visit,  certain criteria was not obtained, such a blood pressure, CBG if applicable, and timed get up and go. Any medications not marked as "taking" were not mentioned during the medication reconciliation part of the visit. Any vitals not documented were not able to be obtained due to this being a telehealth visit or patient was unable to self-report a recent blood pressure reading due to a lack of equipment at home via telehealth. Vitals that have been documented are verbally provided by the patient.   This visit was performed by a medical professional under my direct supervision. I was immediately available for consultation/collaboration. I have reviewed and agree with the Annual Wellness Visit documentation.  Subjective:   Nathaniel [name] is a 73 y.o. who presents for a Medicare Wellness preventive visit.  Visit Complete: Virtual I connected with  Nathaniel [name] on 11/11/23 by a audio enabled telemedicine application and verified that I am speaking with the correct person using two identifiers.  Patient Location: Home  Provider Location: Home Office  I discussed the limitations of evaluation and management by telemedicine. The patient expressed understanding and agreed to proceed.  Vital Signs: Because this visit was a virtual/telehealth visit, some criteria may be missing or patient reported. Any vitals not documented were not able to be obtained and vitals that have been documented are patient reported.  VideoDeclined- This patient declined Librarian, academic. Therefore the visit was completed with audio only.  Persons Participating in Visit: Patient.  AWV Questionnaire: Yes: Patient Medicare AWV questionnaire was completed by the patient on 11/04/2023; I have confirmed that all information answered by patient is correct and no changes since this date.  Cardiac Risk Factors include: advanced age (>34men, >28  women);male gender;hypertension;dyslipidemia     Objective:    Today's Vitals   11/11/23 1515  BP: (!) 144/83  Weight: 220 lb (99.8 kg)  Height: 6\' 4"  (1.93 m)   Body mass index is 26.78 kg/m.     11/11/2023    3:22 PM 11/07/2022    2:02 PM 09/20/2021   12:07 PM 08/31/2021   12:30 PM 08/30/2021   10:37 AM 02/07/2021    7:33 AM 04/11/2020    9:00 PM  Advanced Directives  Does Patient Have a Medical Advance Directive? No  No  No No No  Would patient like information on creating a medical advance directive? No - Patient declined No - Patient declined Yes (MAU/Ambulatory/Procedural Areas - Information given) No - Patient declined  No - Patient declined No - Patient declined    Current Medications (verified) Outpatient Encounter Medications as of 11/11/2023  Medication Sig   acetaminophen  (TYLENOL ) 500 MG tablet Take 500 mg by mouth every 6 (six) hours as needed.   albuterol  (VENTOLIN  HFA) 108 (90 Base) MCG/ACT inhaler Inhale 2 puffs into the lungs every 6 (six) hours as needed for shortness of breath.   ASPIRIN  LOW DOSE 81 MG tablet TAKE 1 TABLET BY MOUTH EVERY DAY   budesonide -formoterol  (SYMBICORT ) 160-4.5 MCG/ACT inhaler TAKE 2 PUFFS BY MOUTH TWICE A DAY   doxylamine, Sleep, (UNISOM) 25 MG tablet Take 25 mg by mouth at bedtime as needed.   ELIQUIS  5 MG TABS tablet TAKE 1 TABLET BY MOUTH TWICE A DAY   esomeprazole (NEXIUM) 20 MG capsule Take 20 mg by mouth daily as needed (heartburn).    losartan  (COZAAR ) 50 MG tablet TAKE 1 TABLET BY MOUTH EVERY DAY   Cyanocobalamin  2500  MCG TABS Take 1 tablet by mouth daily.   ezetimibe  (ZETIA ) 10 MG tablet Take 1 tablet (10 mg total) by mouth daily.   Multiple Vitamins-Minerals (MULTIVITAMIN WITH MINERALS) tablet Take 1 tablet by mouth daily.   No facility-administered encounter medications on file as of 11/11/2023.    Allergies (verified) Fentanyl , Prozac [fluoxetine hcl], and Statins   History: Past Medical History:  Diagnosis Date    Cataract    Chicken pox    GERD (gastroesophageal reflux disease)    Hyperlipidemia    PAD (peripheral artery disease) (HCC)    Pain in both lower extremities 11/23/2016   Peripheral vascular disease (HCC)    Pre-diabetes    Seizures (HCC)    None since age 31   Past Surgical History:  Procedure Laterality Date   BACK SURGERY     COLONOSCOPY WITH PROPOFOL  N/A 02/07/2021   Procedure: COLONOSCOPY WITH PROPOFOL ;  Surgeon: Luke Salaam, MD;  Location: [facility] ENDOSCOPY;  Service: Gastroenterology;  Laterality: N/A;   LAMINECTOMY  1987   LOWER EXTREMITY ANGIOGRAPHY Left 05/02/2017   Procedure: Lower Extremity Angiography;  Surgeon: Celso College, MD;  Location: [facility] INVASIVE CV LAB;  Service: Cardiovascular;  Laterality: Left;   LOWER EXTREMITY ANGIOGRAPHY Left 11/16/2019   Procedure: LOWER EXTREMITY ANGIOGRAPHY;  Surgeon: Celso College, MD;  Location: [facility] INVASIVE CV LAB;  Service: Cardiovascular;  Laterality: Left;   LOWER EXTREMITY ANGIOGRAPHY Left 04/11/2020   Procedure: LOWER EXTREMITY ANGIOGRAPHY;  Surgeon: Celso College, MD;  Location: [facility] INVASIVE CV LAB;  Service: Cardiovascular;  Laterality: Left;   LOWER EXTREMITY ANGIOGRAPHY Left 04/12/2020   Procedure: Lower Extremity Angiography;  Surgeon: Celso College, MD;  Location: [facility] INVASIVE CV LAB;  Service: Cardiovascular;  Laterality: Left;   LOWER EXTREMITY ANGIOGRAPHY Left 08/30/2021   Procedure: Lower Extremity Angiography;  Surgeon: Celso College, MD;  Location: [facility] INVASIVE CV LAB;  Service: Cardiovascular;  Laterality: Left;   LOWER EXTREMITY INTERVENTION Left 08/31/2021   Procedure: LOWER EXTREMITY INTERVENTION;  Surgeon: Celso College, MD;  Location: [facility] INVASIVE CV LAB;  Service: Cardiovascular;  Laterality: Left;   TONSILLECTOMY     TOOTH EXTRACTION  2023   Family History  Problem Relation Age of Onset   Heart disease Mother        Arrthymia    Stroke Father        Deceased   Dementia Father    Heart disease Brother         Myocardial infarction   Colon cancer Neg Hx    Social History   Socioeconomic History   Marital status: Married    Spouse name: Not on file   Number of children: Not on file   Years of education: Not on file   Highest education level: Associate degree: occupational, Scientist, product/process development, or vocational program  Occupational History   Not on file  Tobacco Use   Smoking status: Former    Current packs/day: 0.00    Average packs/day: 1 pack/day for 50.0 years (50.0 ttl pk-yrs)    Types: Cigarettes    Start date: 11/20/1969    Quit date: 11/21/2019    Years since quitting: 3.9   Smokeless tobacco: Never  Vaping Use   Vaping status: Never Used  Substance and Sexual Activity   Alcohol use: Not Currently    Alcohol/week: 0.0 standard drinks of alcohol   Drug use: No   Sexual activity: Not on file  Other Topics Concern   Not on file  Social History Narrative   Retired.   Stays busy with household work, Programmer, applications.   Married for 3 years.   Grown children.   Enjoys reading.    Social Drivers of Corporate investment banker Strain: Low Risk  (11/11/2023)   Overall Financial Resource Strain (CARDIA)    Difficulty of Paying Living Expenses: Not hard at all  Food Insecurity: No Food Insecurity (11/11/2023)   Hunger Vital Sign    Worried About Running Out of Food in the Last Year: Never true    Ran Out of Food in the Last Year: Never true  Transportation Needs: No Transportation Needs (11/11/2023)   PRAPARE - Administrator, Civil Service (Medical): No    Lack of Transportation (Non-Medical): No  Physical Activity: Sufficiently Active (11/11/2023)   Exercise Vital Sign    Days of Exercise per Week: 6 days    Minutes of Exercise per Session: 40 min  Stress: No Stress Concern Present (11/11/2023)   [employer] of Occupational Health - Occupational Stress Questionnaire    Feeling of Stress : Not at all  Social Connections: Moderately Integrated (11/11/2023)   Social Connection  and Isolation Panel [NHANES]    Frequency of Communication with Friends and Family: Twice a week    Frequency of Social Gatherings with Friends and Family: Once a week    Attends Religious Services: Never    Database administrator or Organizations: No    Attends Engineer, structural: More than 4 times per year    Marital Status: Married    Tobacco Counseling Counseling given: Not Answered    Clinical Intake:  Pre-visit preparation completed: Yes  Pain : No/denies pain     BMI - recorded: 26.78 Nutritional Status: BMI 25 -29 Overweight Nutritional Risks: None Diabetes: No  Lab Results  Component Value Date   HGBA1C 6.0 06/14/2023   HGBA1C 6.2 06/21/2022   HGBA1C 6.1 04/19/2021     What is the last grade level you completed in school?: some college     Information entered by :: [employer]   Activities of Daily Living     11/04/2023   10:46 AM  In your present state of health, do you have any difficulty performing the following activities:  Hearing? 1  Vision? 0  Difficulty concentrating or making decisions? 0  Walking or climbing stairs? 1  Dressing or bathing? 0  Doing errands, shopping? 0  Preparing Food and eating ? N  Using the Toilet? N  In the past six months, have you accidently leaked urine? N  Do you have problems with loss of bowel control? N  Managing your Medications? N  Managing your Finances? N  Housekeeping or managing your Housekeeping? N    Patient Care Team: Gabriel John, NP as PCP - General (Nurse Practitioner)  Indicate any recent Medical Services you may have received from other than [facility] providers in the past year (date may be approximate).     Assessment:   This is a routine wellness examination for [facility].  Hearing/Vision screen Hearing Screening - Comments:: Patient has hearing aids. 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  Depression Screen     11/11/2023    3:24 PM 11/07/2022    2:00 PM 09/20/2021   12:10 PM 02/23/2020    9:07 AM 02/20/2019    9:25 AM 02/10/2018    8:58 AM 01/16/2016   11:19 AM  PHQ 2/9 Scores  PHQ - 2 Score 0 0 0 0 0 1 0  PHQ- 9 Score 3   0  3     Fall Risk     11/04/2023   10:46 AM 11/07/2022    1:51 PM 11/07/2022   12:32 PM 09/20/2022   12:15 PM 09/20/2021   12:08 PM  Fall Risk   Falls in the past year? 0 0 0 0 0  Number falls in past yr: 0 0 0 0 0  Injury with Fall? 0 0 0 0 0  Risk for fall due to :  No Fall Risks   Impaired balance/gait  Follow up Falls evaluation completed;Falls prevention discussed Falls prevention discussed;Falls evaluation completed   Falls prevention discussed    MEDICARE RISK AT HOME:  Medicare Risk at Home Any stairs in or around the home?: (Patient-Rptd) No If so, are there any without handrails?: (Patient-Rptd) No Home free of loose throw rugs in walkways, pet beds, electrical cords, etc?: (Patient-Rptd) Yes Adequate lighting in your home to reduce risk of falls?: (Patient-Rptd) Yes Life alert?: (Patient-Rptd) No Use of a cane, walker or w/c?: (Patient-Rptd) No Grab bars in the bathroom?: (Patient-Rptd) Yes Shower chair or bench in shower?: (Patient-Rptd) No Elevated toilet seat or a handicapped toilet?: (Patient-Rptd) No  TIMED UP AND GO:  Was the test performed?  No  Cognitive Function: 6CIT completed    02/23/2020    9:11 AM 02/10/2018    8:57 AM  MMSE - Mini Mental State Exam  Orientation to time 5 5  Orientation to Place 5 5  Registration 3 3  Attention/ Calculation 5 0  Recall 3 3  Language- name 2 objects  0  Language- repeat 1 1  Language- follow 3 step command  3  Language- read & follow direction  0  Write a sentence  0  Copy design  0  Total score  20        11/11/2023     3:18 PM 11/07/2022    2:05 PM 09/20/2021   12:16 PM  6CIT Screen  What Year? 0 points 0 points 0 points  What month? 0 points 0 points 0 points  What time? 0 points 0 points 0 points  Count back from 20 0 points 0 points 2 points  Months in reverse 4 points 0 points 4 points  Repeat phrase 0 points 4 points 0 points  Total Score 4 points 4 points 6 points    Immunizations Immunization History  Administered Date(s) Administered   Influenza-Unspecified 05/23/2018   Td 03/08/2009   Tdap 10/16/2010    Screening Tests Health Maintenance  Topic Date Due   COVID-19 Vaccine (1) Never done   Pneumonia Vaccine 50+ Years old (1 of 2 - PCV) Never done   Zoster Vaccines- Shingrix (1 of 2) Never done   DTaP/Tdap/Td (3 - Td or Tdap) 10/15/2020   Colonoscopy  02/08/2024   INFLUENZA VACCINE  02/21/2024   Lung Cancer Screening  03/10/2024   Medicare Annual Wellness (AWV)  11/10/2024   Hepatitis C Screening  Completed   HPV VACCINES  Aged Out   Meningococcal B Vaccine  Aged Out    Health Maintenance  Health  Maintenance Due  Topic Date Due   COVID-19 Vaccine (1) Never done   Pneumonia Vaccine 54+ Years old (1 of 2 - PCV) Never done   Zoster Vaccines- Shingrix (1 of 2) Never done   DTaP/Tdap/Td (3 - Td or Tdap) 10/15/2020   Colonoscopy  02/08/2024   Health Maintenance Items Addressed:patient declined vaccinations  Additional Screening:  Vision Screening: Recommended annual ophthalmology exams for early detection of glaucoma and other disorders of the eye.  Dental Screening: Recommended annual dental exams for proper oral hygiene  Community Resource Referral / Chronic Care Management: CRR required this visit?  No   CCM required this visit?  No     Plan:     I have personally reviewed and noted the following in the patient's chart:   Medical and social history Use of alcohol, tobacco or illicit drugs  Current medications and supplements including opioid prescriptions.  Patient is not currently taking opioid prescriptions. Functional ability and status Nutritional status Physical activity Advanced directives List of other physicians Hospitalizations, surgeries, and ER visits in previous 12 months Vitals Screenings to include cognitive, depression, and falls Referrals and appointments  In addition, I have reviewed and discussed with patient certain preventive protocols, quality metrics, and best practice recommendations. A written personalized care plan for preventive services as well as general preventive health recommendations were provided to patient.     Freeda Jerry, [place]   11/11/2023   After Visit Summary: (MyChart) Due to this being a telephonic visit, the after visit summary with patients personalized plan was offered to patient via MyChart   Notes: Nothing significant to report at this time.

## 2023-11-19 DIAGNOSIS — M9905 Segmental and somatic dysfunction of pelvic region: Secondary | ICD-10-CM | POA: Diagnosis not present

## 2023-11-19 DIAGNOSIS — M5136 Other intervertebral disc degeneration, lumbar region with discogenic back pain only: Secondary | ICD-10-CM | POA: Diagnosis not present

## 2023-11-19 DIAGNOSIS — M62830 Muscle spasm of back: Secondary | ICD-10-CM | POA: Diagnosis not present

## 2023-11-19 DIAGNOSIS — M9903 Segmental and somatic dysfunction of lumbar region: Secondary | ICD-10-CM | POA: Diagnosis not present

## 2023-12-10 ENCOUNTER — Encounter (INDEPENDENT_AMBULATORY_CARE_PROVIDER_SITE_OTHER): Payer: Self-pay

## 2023-12-17 DIAGNOSIS — M9903 Segmental and somatic dysfunction of lumbar region: Secondary | ICD-10-CM | POA: Diagnosis not present

## 2023-12-17 DIAGNOSIS — M9905 Segmental and somatic dysfunction of pelvic region: Secondary | ICD-10-CM | POA: Diagnosis not present

## 2023-12-17 DIAGNOSIS — M5136 Other intervertebral disc degeneration, lumbar region with discogenic back pain only: Secondary | ICD-10-CM | POA: Diagnosis not present

## 2023-12-17 DIAGNOSIS — M62830 Muscle spasm of back: Secondary | ICD-10-CM | POA: Diagnosis not present

## 2024-01-14 DIAGNOSIS — M9903 Segmental and somatic dysfunction of lumbar region: Secondary | ICD-10-CM | POA: Diagnosis not present

## 2024-01-14 DIAGNOSIS — M5136 Other intervertebral disc degeneration, lumbar region with discogenic back pain only: Secondary | ICD-10-CM | POA: Diagnosis not present

## 2024-01-14 DIAGNOSIS — M9905 Segmental and somatic dysfunction of pelvic region: Secondary | ICD-10-CM | POA: Diagnosis not present

## 2024-01-14 DIAGNOSIS — M62830 Muscle spasm of back: Secondary | ICD-10-CM | POA: Diagnosis not present

## 2024-02-11 DIAGNOSIS — M5136 Other intervertebral disc degeneration, lumbar region with discogenic back pain only: Secondary | ICD-10-CM | POA: Diagnosis not present

## 2024-02-11 DIAGNOSIS — M62830 Muscle spasm of back: Secondary | ICD-10-CM | POA: Diagnosis not present

## 2024-02-11 DIAGNOSIS — M9905 Segmental and somatic dysfunction of pelvic region: Secondary | ICD-10-CM | POA: Diagnosis not present

## 2024-02-11 DIAGNOSIS — M9903 Segmental and somatic dysfunction of lumbar region: Secondary | ICD-10-CM | POA: Diagnosis not present

## 2024-02-22 ENCOUNTER — Other Ambulatory Visit (INDEPENDENT_AMBULATORY_CARE_PROVIDER_SITE_OTHER): Payer: Self-pay | Admitting: Nurse Practitioner

## 2024-02-22 ENCOUNTER — Other Ambulatory Visit: Payer: Self-pay | Admitting: Primary Care

## 2024-02-22 DIAGNOSIS — J432 Centrilobular emphysema: Secondary | ICD-10-CM

## 2024-02-23 NOTE — Telephone Encounter (Signed)
Patient is due for follow up in late November, this will be required prior to any further refills.  Please schedule, thank you!

## 2024-03-09 DIAGNOSIS — M5136 Other intervertebral disc degeneration, lumbar region with discogenic back pain only: Secondary | ICD-10-CM | POA: Diagnosis not present

## 2024-03-09 DIAGNOSIS — M9905 Segmental and somatic dysfunction of pelvic region: Secondary | ICD-10-CM | POA: Diagnosis not present

## 2024-03-09 DIAGNOSIS — M9903 Segmental and somatic dysfunction of lumbar region: Secondary | ICD-10-CM | POA: Diagnosis not present

## 2024-03-09 DIAGNOSIS — M62830 Muscle spasm of back: Secondary | ICD-10-CM | POA: Diagnosis not present

## 2024-04-07 DIAGNOSIS — M9903 Segmental and somatic dysfunction of lumbar region: Secondary | ICD-10-CM | POA: Diagnosis not present

## 2024-04-07 DIAGNOSIS — M5136 Other intervertebral disc degeneration, lumbar region with discogenic back pain only: Secondary | ICD-10-CM | POA: Diagnosis not present

## 2024-04-07 DIAGNOSIS — M62830 Muscle spasm of back: Secondary | ICD-10-CM | POA: Diagnosis not present

## 2024-04-07 DIAGNOSIS — M9905 Segmental and somatic dysfunction of pelvic region: Secondary | ICD-10-CM | POA: Diagnosis not present

## 2024-04-13 ENCOUNTER — Other Ambulatory Visit: Payer: Self-pay | Admitting: Cardiovascular Disease

## 2024-04-28 ENCOUNTER — Ambulatory Visit (INDEPENDENT_AMBULATORY_CARE_PROVIDER_SITE_OTHER): Admitting: Vascular Surgery

## 2024-04-28 ENCOUNTER — Ambulatory Visit (INDEPENDENT_AMBULATORY_CARE_PROVIDER_SITE_OTHER)

## 2024-04-28 ENCOUNTER — Encounter (INDEPENDENT_AMBULATORY_CARE_PROVIDER_SITE_OTHER): Payer: Self-pay | Admitting: Vascular Surgery

## 2024-04-28 VITALS — BP 166/95 | HR 84 | Resp 18 | Ht 76.0 in | Wt 222.4 lb

## 2024-04-28 DIAGNOSIS — I70212 Atherosclerosis of native arteries of extremities with intermittent claudication, left leg: Secondary | ICD-10-CM

## 2024-04-28 DIAGNOSIS — I10 Essential (primary) hypertension: Secondary | ICD-10-CM

## 2024-04-28 DIAGNOSIS — E785 Hyperlipidemia, unspecified: Secondary | ICD-10-CM | POA: Diagnosis not present

## 2024-04-28 DIAGNOSIS — I6523 Occlusion and stenosis of bilateral carotid arteries: Secondary | ICD-10-CM

## 2024-04-28 MED ORDER — APIXABAN 5 MG PO TABS: 5.0000 mg | ORAL_TABLET | Freq: Two times a day (BID) | ORAL | 11 refills | Status: AC

## 2024-04-28 NOTE — Assessment & Plan Note (Signed)
 ABIs today are 1.09 on the right and 0.68 on the left which are stable.  Refill for Eliquis  today. No role for intervention. Recheck in six months.

## 2024-04-28 NOTE — Progress Notes (Signed)
 MRN : [id number]  Nathaniel [name] is a 73 y.o. ([birth year]) male who presents with chief complaint of  Chief Complaint  Patient presents with   Follow-up    6 month follow  up w/ ABI  .  History of Present Illness: Patient returns today in follow up of his PAD.  He can walk about 1/6 of a mile before his left calf pain from claudication becomes unbearable.  This is stable. No ulceration or infection. No right sided symptoms. ABIs today are 1.09 on the right and 0.68 on the left which are stable.   Current Outpatient Medications  Medication Sig Dispense Refill   acetaminophen  (TYLENOL ) 500 MG tablet Take 500 mg by mouth every 6 (six) hours as needed.     albuterol  (VENTOLIN  HFA) 108 (90 Base) MCG/ACT inhaler Inhale 2 puffs into the lungs every 6 (six) hours as needed for shortness of breath. 8 g 0   ASPIRIN  LOW DOSE 81 MG tablet TAKE 1 TABLET BY MOUTH EVERY DAY 90 tablet 3   budesonide -formoterol  (SYMBICORT ) 160-4.5 MCG/ACT inhaler TAKE 2 PUFFS BY MOUTH TWICE A DAY 30.6 each 0   doxylamine, Sleep, (UNISOM) 25 MG tablet Take 25 mg by mouth at bedtime as needed.     ELIQUIS  5 MG TABS tablet TAKE 1 TABLET BY MOUTH TWICE A DAY 60 tablet 11   esomeprazole (NEXIUM) 20 MG capsule Take 20 mg by mouth daily as needed (heartburn).      ezetimibe  (ZETIA ) 10 MG tablet TAKE 1 TABLET BY MOUTH EVERY DAY 90 tablet 0   losartan  (COZAAR ) 50 MG tablet TAKE 1 TABLET BY MOUTH EVERY DAY 90 tablet 3   Cyanocobalamin  2500 MCG TABS Take 1 tablet by mouth daily.     Multiple Vitamins-Minerals (MULTIVITAMIN WITH MINERALS) tablet Take 1 tablet by mouth daily.     No current facility-administered medications for this visit.    Past Medical History:  Diagnosis Date   Cataract    Chicken pox    GERD (gastroesophageal reflux disease)    Hyperlipidemia    PAD (peripheral artery disease)    Pain in both lower extremities 11/23/2016   Peripheral vascular disease    Pre-diabetes    Seizures (HCC)    None  since age 29    Past Surgical History:  Procedure Laterality Date   BACK SURGERY     COLONOSCOPY WITH PROPOFOL  N/A 02/07/2021   Procedure: COLONOSCOPY WITH PROPOFOL ;  Surgeon: Therisa Bi, MD;  Location: [facility] ENDOSCOPY;  Service: Gastroenterology;  Laterality: N/A;   LAMINECTOMY  1987   LOWER EXTREMITY ANGIOGRAPHY Left 05/02/2017   Procedure: Lower Extremity Angiography;  Surgeon: Marea Selinda RAMAN, MD;  Location: [facility] INV[facility]IVE [facility];  Service: Cardiovascular;  Laterality: Left;   LOWER EXTREMITY ANGIOGRAPHY Left 11/16/2019   Procedure: LOWER EXTREMITY ANGIOGRAPHY;  Surgeon: Marea Selinda RAMAN, MD;  Location: [facility];  Service: Cardiovascular;  Laterality: Left;   LOWER EXTREMITY ANGIOGRAPHY Left 04/11/2020   Procedure: LOWER EXTREMITY ANGIOGRAPHY;  Surgeon: Marea Selinda RAMAN, MD;  Location: [facility];  Service: Cardiovascular;  Laterality: Left;   LOWER EXTREMITY ANGIOGRAPHY Left 04/12/2020   Procedure: Lower Extremity Angiography;  Surgeon: Marea Selinda RAMAN, MD;  Location: [facility];  Service: Cardiovascular;  Laterality: Left;   LOWER EXTREMITY ANGIOGRAPHY Left 08/30/2021   Procedure: Lower Extremity Angiography;  Surgeon: Marea Selinda RAMAN, MD;  Location: [facility];  Service: Cardiovascular;  Laterality: Left;   LOWER EXTREMITY  INTERVENTION Left 08/31/2021   Procedure: LOWER EXTREMITY INTERVENTION;  Surgeon: Marea Selinda RAMAN, MD;  Location: [facility];  Service: Cardiovascular;  Laterality: Left;   TONSILLECTOMY     TOOTH EXTRACTION  2023     Social History   Tobacco Use   Smoking status: Former    Current packs/day: 0.00    Average packs/day: 1 pack/day for 50.0 years (50.0 ttl pk-yrs)    Types: Cigarettes    Start date: 11/20/1969    Quit date: 11/21/2019    Years since quitting: 4.4   Smokeless tobacco: Never  Vaping Use   Vaping status: Never Used  Substance Use Topics   Alcohol use: Not Currently    Alcohol/week: 0.0 standard drinks of alcohol    Drug use: No      Family History  Problem Relation Age of Onset   Heart disease Mother        Arrthymia    Stroke Father        Deceased   Dementia Father    Heart disease Brother        Myocardial infarction   Colon cancer Neg Hx      Allergies  Allergen Reactions   Fentanyl  Other (See Comments)    chills   Prozac [Fluoxetine Hcl] Other (See Comments)    Extreme depression   Statins Other (See Comments)    Flu like symptoms     REVIEW OF SYSTEMS (Negative unless checked)   Constitutional: [] Weight loss  [] Fever  [] Chills Cardiac: [] Chest pain   [] Chest pressure   [] Palpitations   [] Shortness of breath when laying flat   [] Shortness of breath at rest   [] Shortness of breath with exertion. Vascular:  [x] Pain in legs with walking   [] Pain in legs at rest   [] Pain in legs when laying flat   [x] Claudication   [] Pain in feet when walking  [] Pain in feet at rest  [] Pain in feet when laying flat   [] History of DVT   [] Phlebitis   [] Swelling in legs   [] Varicose veins   [] Non-healing ulcers Pulmonary:   [] Uses home oxygen   [] Productive cough   [] Hemoptysis   [] Wheeze  [] COPD   [] Asthma Neurologic:  [] Dizziness  [] Blackouts   [x] Seizures   [] History of stroke   [] History of TIA  [] Aphasia   [] Temporary blindness   [] Dysphagia   [] Weakness or numbness in arms   [] Weakness or numbness in legs Musculoskeletal:  [x] Arthritis   [] Joint swelling   [] Joint pain   [] Low back pain Hematologic:  [] Easy bruising  [] Easy bleeding   [] Hypercoagulable state   [] Anemic   Gastrointestinal:  [] Blood in stool   [] Vomiting blood  [x] Gastroesophageal reflux/heartburn   [] Abdominal pain Genitourinary:  [] Chronic kidney disease   [] Difficult urination  [] Frequent urination  [] Burning with urination   [] Hematuria Skin:  [] Rashes   [] Ulcers   [] Wounds Psychological:  [] History of anxiety   []  History of major depression.  Physical Examination  BP (!) 166/95 (BP Location: Right Arm, Patient Position:  Sitting, Cuff Size: Normal)   Pulse 84   Resp 18   Ht 6' 4 (1.93 m)   Wt 222 lb 6.4 oz (100.9 kg)   BMI 27.07 kg/m  Gen:  WD/WN, NAD Head: [place]/AT, No temporalis wasting. Ear/Nose/Throat: Hearing grossly intact, nares w/o erythema or drainage Eyes: Conjunctiva clear. Sclera non-icteric Neck: Supple.  Trachea midline Pulmonary:  Good air movement, no use of accessory muscles.  Cardiac: RRR, no  JVD Vascular:  Vessel Right Left  Radial Palpable Palpable                          PT 2+ Palpable 1+ Palpable  DP 2+ Palpable 1+ Palpable   Gastrointestinal: soft, non-tender/non-distended. No guarding/reflex.  Musculoskeletal: M/S 5/5 throughout.  No deformity or atrophy. No LE edema. Neurologic: Sensation grossly intact in extremities.  Symmetrical.  Speech is fluent.  Psychiatric: Judgment intact, Mood & affect appropriate for pt's clinical situation. Dermatologic: No rashes or ulcers noted.  No cellulitis or open wounds.      Labs No results found for this or any previous visit (from the past 2160 hours).  Radiology No results found.  Assessment/Plan  Atherosclerosis of native arteries of extremity with intermittent claudication ABIs today are 1.09 on the right and 0.68 on the left which are stable.  Refill for Eliquis  today. No role for intervention. Recheck in six months.  Hyperlipidemia lipid control important in reducing the progression of atherosclerotic disease. Continue statin therapy     Stenosis of carotid artery Very mild 2 years ago.     Essential hypertension blood pressure control important in reducing the progression of atherosclerotic disease. On appropriate oral medications.  Selinda Gu, MD  04/28/2024 12:02 PM    This note was created with Dragon medical transcription system.  Any errors from dictation are purely unintentional

## 2024-05-01 NOTE — Progress Notes (Signed)
 Cardiology Office Note  Date:  05/04/2024   ID:  Nathaniel [name], DOB September 04, 1950, MRN [id number]  PCP:  Nathaniel Comer POUR, NP   Chief Complaint  Patient presents with   12 month follow up     Patient c/o chest discomfort cramping when sitting up but when lying the symptoms subside and has shortness of breath at times.     HPI:  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   multiple interventions on that left leg and  right external iliac artery. Smoker, COPD Hyperlipidemia COVID April 2022 (and 2019) Hypertension DVT on eliquis , recurrent Who f/u for  shortness of breath, COVID 11/2020, hyperlipidemia  LOV 9/24 Followed by Dr. Marea, vascular  Statin intolerance Reports he is taking Zetia  Minimal change in total cholesterol numbers still running around 220 Does not want other medications especially injections  On eliquis , brain fog better, better vivid dreams Also on aspirin   Reports blockages in the leg,  working on collaterals If he walks too far he will get claudication symptoms, burning Walks the dog one third of a mile every afternoon  Has given up on his hobbies Previously working with honeybees, Administrator  Lab work reviewed A1c 6.0 Total cholesterol 221 LDL 129  Imaging reviewed LE arterial u/s No flow seen in the SFA and Popliteal Artery.   EKG personally reviewed by myself on todays visit EKG Interpretation Date/Time:  Monday May 04 2024 09:48:32 EDT Ventricular Rate:  96 PR Interval:  144 QRS Duration:  82 QT Interval:  346 QTC Calculation: 437 R Axis:   -33  Text Interpretation: Normal sinus rhythm Left axis deviation Possible Inferior infarct (cited on or before 22-Apr-2023) When compared with ECG of 22-Apr-2023 11:43, No significant change was found Confirmed by Perla Lye  [phone]) on 05/04/2024 10:04:08 AM   Echocardiogram Low normal ejection fraction estimated 50% Normal RV function no significant valve disease  Stress test 9/22   Normal pharmacologic myocardial perfusion stress test without significant ischemia or scar.   Left ventricular function is normal (55-65%).   There is no significant coronary artery calcification.  Mild aortic atherosclerosis is noted on the attenuation correction CT.   This is a low risk study.   02/2020: chest CT, Minimal aortic athero, no significant coronary calcification noted  noninvasive studies on 03/21/2020 showed 1 to 39% stenosis of the right carotid normal in the left carotid.  However the right subclavian was noted to be stenotic with turbulent flow.  noninvasive study 6/22 showed only 39% internal carotid artery stenosis bilaterally.  The left subclavian artery is stenotic although the velocities are consistent with previous studies.  The right clavian artery had normal flow hemodynamics.    PMH:   has a past medical history of Cataract, Chicken pox, GERD (gastroesophageal reflux disease), Hyperlipidemia, PAD (peripheral artery disease), Pain in both lower extremities (11/23/2016), Peripheral vascular disease, Pre-diabetes, and Seizures (HCC).  PSH:    Past Surgical History:  Procedure Laterality Date   BACK SURGERY     COLONOSCOPY WITH PROPOFOL  N/A 02/07/2021   Procedure: COLONOSCOPY WITH PROPOFOL ;  Surgeon: Therisa Bi, MD;  Location[facility] ENDOSCOPY;  Service: Gastroenterology;  Laterality: N/A;   LAMINECTOMY  1987   LOWER EXTREMITY ANGIOGRAPHY Left 05/02/2017   Procedure: Lower Extremity Angiography;  Surgeon: Nathaniel Selinda RAMAN, MD;  Location: [facility];  Service: Cardiovascular;  Laterality:  Left;   LOWER EXTREMITY ANGIOGRAPHY Left 11/16/2019   Procedure: LOWER EXTREMITY ANGIOGRAPHY;  Surgeon: Nathaniel Selinda RAMAN, MD;  Location: [facility];  Service: Cardiovascular;  Laterality: Left;   LOWER  EXTREMITY ANGIOGRAPHY Left 04/11/2020   Procedure: LOWER EXTREMITY ANGIOGRAPHY;  Surgeon: Nathaniel Selinda RAMAN, MD;  Location: [facility];  Service: Cardiovascular;  Laterality: Left;   LOWER EXTREMITY ANGIOGRAPHY Left 04/12/2020   Procedure: Lower Extremity Angiography;  Surgeon: Nathaniel Selinda RAMAN, MD;  Location: [facility];  Service: Cardiovascular;  Laterality: Left;   LOWER EXTREMITY ANGIOGRAPHY Left 08/30/2021   Procedure: Lower Extremity Angiography;  Surgeon: Nathaniel Selinda RAMAN, MD;  Location: [facility];  Service: Cardiovascular;  Laterality: Left;   LOWER EXTREMITY INTERVENTION Left 08/31/2021   Procedure: LOWER EXTREMITY INTERVENTION;  Surgeon: Nathaniel Selinda RAMAN, MD;  Location: [facility];  Service: Cardiovascular;  Laterality: Left;   TONSILLECTOMY     TOOTH EXTRACTION  2023    Current Outpatient Medications  Medication Sig Dispense Refill   acetaminophen  (TYLENOL ) 500 MG tablet Take 500 mg by mouth every 6 (six) hours as needed.     albuterol  (VENTOLIN  HFA) 108 (90 Base) MCG/ACT inhaler Inhale 2 puffs into the lungs every 6 (six) hours as needed for shortness of breath. 8 g 0   apixaban  (ELIQUIS ) 5 MG TABS tablet Take 1 tablet (5 mg total) by mouth 2 (two) times daily. 60 tablet 11   ASPIRIN  LOW DOSE 81 MG tablet TAKE 1 TABLET BY MOUTH EVERY DAY 90 tablet 3   budesonide -formoterol  (SYMBICORT ) 160-4.5 MCG/ACT inhaler TAKE 2 PUFFS BY MOUTH TWICE A DAY 30.6 each 0   doxylamine, Sleep, (UNISOM) 25 MG tablet Take 25 mg by mouth at bedtime as needed.     esomeprazole (NEXIUM) 20 MG capsule Take 20 mg by mouth daily as needed (heartburn).      ezetimibe  (ZETIA ) 10 MG tablet TAKE 1 TABLET BY MOUTH EVERY DAY 90 tablet 0   losartan  (COZAAR ) 50 MG tablet TAKE 1 TABLET BY MOUTH EVERY DAY 90 tablet 3   Multiple Vitamins-Minerals (MULTIVITAMIN WITH MINERALS) tablet Take 1 tablet by mouth daily.     No current facility-administered medications for this visit.   Allergies:    Fentanyl , Prozac [fluoxetine hcl], and Statins   Social History:  The patient  reports that he quit smoking about 4 years ago. His smoking use included cigarettes. He started smoking about 54 years ago. He has a 50 pack-year smoking history. He has never used smokeless tobacco. He reports that he does not currently use alcohol. He reports that he does not use drugs.   Family History:   family history includes Dementia in his father; Heart disease in his brother and mother; Stroke in his father.    Review of Systems: Review of Systems  Constitutional: Negative.   HENT: Negative.    Respiratory: Negative.    Cardiovascular: Negative.   Gastrointestinal: Negative.   Musculoskeletal: Negative.   Neurological: Negative.   Psychiatric/Behavioral: Negative.    All other systems reviewed and are negative.   PHYSICAL EXAM: VS:  BP 120/80 (BP Location: Left Arm, Patient Position: Sitting, Cuff Size: Normal)   Pulse 96   Ht 6' 4 (1.93 m)   Wt 222 lb (100.7 kg)   SpO2 98%   BMI 27.02 kg/m  , BMI Body mass index is 27.02 kg/m. Constitutional:  oriented to person, place, and time. No distress.  HENT:  Head: Grossly  normal Eyes:  no discharge. No scleral icterus.  Neck: No JVD, no carotid bruits  Cardiovascular: Regular rate and rhythm, no murmurs appreciated Pulmonary/Chest: Clear to auscultation bilaterally, no wheezes or rales Abdominal: Soft.  no distension.  no tenderness.  Musculoskeletal: Normal range of motion Neurological:  normal muscle tone. Coordination normal. No atrophy Skin: Skin warm and dry Psychiatric: normal affect, pleasant  Recent Labs: 06/14/2023: ALT 13; BUN 17; Creatinine, Ser 1.36; Potassium 4.0; Sodium 138    Lipid Panel Lab Results  Component Value Date   CHOL 221 (H) 06/14/2023   HDL 41.40 06/14/2023   LDLCALC 129 (H) 06/14/2023   TRIG 254.0 (H) 06/14/2023    Wt Readings from Last 3 Encounters:  05/04/24 222 lb (100.7 kg)  04/28/24 222 lb 6.4 oz  (100.9 kg)  11/11/23 220 lb (99.8 kg)     ASSESSMENT AND PLAN:  Problem List Items Addressed This Visit       Cardiology Problems   Atherosclerosis of native arteries of extremity with intermittent claudication - Primary   Relevant Orders   EKG 12-Lead (Completed)   Hyperlipidemia   Essential hypertension   Relevant Orders   EKG 12-Lead (Completed)   Stenosis of carotid artery   Relevant Orders   EKG 12-Lead (Completed)     Other   Tobacco abuse   Centrilobular emphysema (HCC)   Chest discomfort   Other Visit Diagnoses       PAD (peripheral artery disease)       Relevant Orders   EKG 12-Lead (Completed)       Peripheral arterial disease Followed by Dr. Marea,  Prior smoking history A1c at goal reports statin myalgias, has tried 3, not interested in a statin  Recommend he continue to take Zetia  He is willing to retry Zetia  10 mg daily Declining PCSK9 inhibitor, declining  Leqvio  COPD Uses inhaler as needed, long smoking history Quit smoking Denies significant COPD complications  Essential hypertension Blood pressure is well controlled on today's visit. No changes made to the medications.  Shortness of breath Secondary to smoking history, COPD, deconditioning Prior stress test and echocardiogram reviewed, no ischemia, normal ejection fraction Denies any change in his symptoms, walking one third of a mile with his dogs  Hyperlipidemia Previously declining statins Recommend he take Zetia  Cholesterol well above goal, numbers discussed Consider PCSK9 inhibitor but he is declining at this time also declining Leqvio    Signed, Velinda Lunger, M.D., Ph.D. [facility] [place], [place] [phone]

## 2024-05-04 ENCOUNTER — Encounter: Payer: Self-pay | Admitting: Cardiovascular Disease

## 2024-05-04 ENCOUNTER — Ambulatory Visit: Attending: Cardiovascular Disease | Admitting: Cardiovascular Disease

## 2024-05-04 VITALS — BP 120/80 | HR 96 | Ht 76.0 in | Wt 222.0 lb

## 2024-05-04 DIAGNOSIS — I739 Peripheral vascular disease, unspecified: Secondary | ICD-10-CM | POA: Insufficient documentation

## 2024-05-04 DIAGNOSIS — E785 Hyperlipidemia, unspecified: Secondary | ICD-10-CM | POA: Insufficient documentation

## 2024-05-04 DIAGNOSIS — I10 Essential (primary) hypertension: Secondary | ICD-10-CM | POA: Diagnosis not present

## 2024-05-04 DIAGNOSIS — J432 Centrilobular emphysema: Secondary | ICD-10-CM | POA: Insufficient documentation

## 2024-05-04 DIAGNOSIS — Z720 Tobacco use: Secondary | ICD-10-CM | POA: Insufficient documentation

## 2024-05-04 DIAGNOSIS — I70212 Atherosclerosis of native arteries of extremities with intermittent claudication, left leg: Secondary | ICD-10-CM | POA: Insufficient documentation

## 2024-05-04 DIAGNOSIS — I6529 Occlusion and stenosis of unspecified carotid artery: Secondary | ICD-10-CM | POA: Diagnosis not present

## 2024-05-04 DIAGNOSIS — R0789 Other chest pain: Secondary | ICD-10-CM | POA: Diagnosis not present

## 2024-05-04 MED ORDER — EZETIMIBE 10 MG PO TABS: 10.0000 mg | ORAL_TABLET | Freq: Every day | ORAL | 3 refills | Status: AC

## 2024-05-04 MED ORDER — LOSARTAN POTASSIUM 50 MG PO TABS: 50.0000 mg | ORAL_TABLET | Freq: Every day | ORAL | 3 refills | Status: AC

## 2024-05-04 NOTE — Patient Instructions (Addendum)

## 2024-05-05 DIAGNOSIS — M62830 Muscle spasm of back: Secondary | ICD-10-CM | POA: Diagnosis not present

## 2024-05-05 DIAGNOSIS — M9905 Segmental and somatic dysfunction of pelvic region: Secondary | ICD-10-CM | POA: Diagnosis not present

## 2024-05-05 DIAGNOSIS — M5136 Other intervertebral disc degeneration, lumbar region with discogenic back pain only: Secondary | ICD-10-CM | POA: Diagnosis not present

## 2024-05-05 DIAGNOSIS — M9903 Segmental and somatic dysfunction of lumbar region: Secondary | ICD-10-CM | POA: Diagnosis not present

## 2024-05-05 LAB — VAS US ABI WITH/WO TBI
Left ABI: 0.68
Right ABI: 1.09

## 2024-06-02 DIAGNOSIS — M9903 Segmental and somatic dysfunction of lumbar region: Secondary | ICD-10-CM | POA: Diagnosis not present

## 2024-06-02 DIAGNOSIS — M62830 Muscle spasm of back: Secondary | ICD-10-CM | POA: Diagnosis not present

## 2024-06-02 DIAGNOSIS — M9905 Segmental and somatic dysfunction of pelvic region: Secondary | ICD-10-CM | POA: Diagnosis not present

## 2024-06-02 DIAGNOSIS — M5136 Other intervertebral disc degeneration, lumbar region with discogenic back pain only: Secondary | ICD-10-CM | POA: Diagnosis not present

## 2024-06-09 ENCOUNTER — Encounter: Payer: Self-pay | Admitting: Primary Care

## 2024-06-09 ENCOUNTER — Ambulatory Visit (INDEPENDENT_AMBULATORY_CARE_PROVIDER_SITE_OTHER): Admitting: Primary Care

## 2024-06-09 VITALS — BP 136/84 | HR 85 | Temp 97.8°F | Ht 74.25 in | Wt 225.4 lb

## 2024-06-09 DIAGNOSIS — J432 Centrilobular emphysema: Secondary | ICD-10-CM | POA: Diagnosis not present

## 2024-06-09 DIAGNOSIS — I998 Other disorder of circulatory system: Secondary | ICD-10-CM

## 2024-06-09 DIAGNOSIS — E042 Nontoxic multinodular goiter: Secondary | ICD-10-CM | POA: Diagnosis not present

## 2024-06-09 DIAGNOSIS — K219 Gastro-esophageal reflux disease without esophagitis: Secondary | ICD-10-CM

## 2024-06-09 DIAGNOSIS — I10 Essential (primary) hypertension: Secondary | ICD-10-CM | POA: Diagnosis not present

## 2024-06-09 DIAGNOSIS — E785 Hyperlipidemia, unspecified: Secondary | ICD-10-CM

## 2024-06-09 DIAGNOSIS — R5382 Chronic fatigue, unspecified: Secondary | ICD-10-CM

## 2024-06-09 DIAGNOSIS — Z720 Tobacco use: Secondary | ICD-10-CM

## 2024-06-09 DIAGNOSIS — Z125 Encounter for screening for malignant neoplasm of prostate: Secondary | ICD-10-CM

## 2024-06-09 DIAGNOSIS — I70212 Atherosclerosis of native arteries of extremities with intermittent claudication, left leg: Secondary | ICD-10-CM | POA: Diagnosis not present

## 2024-06-09 DIAGNOSIS — R7303 Prediabetes: Secondary | ICD-10-CM

## 2024-06-09 DIAGNOSIS — R7989 Other specified abnormal findings of blood chemistry: Secondary | ICD-10-CM

## 2024-06-09 LAB — PSA, MEDICARE: PSA: 1.29 ng/mL (ref 0.10–4.00)

## 2024-06-09 LAB — CBC
HCT: 43.8 % (ref 39.0–52.0)
Hemoglobin: 14.8 g/dL (ref 13.0–17.0)
MCHC: 33.9 g/dL (ref 30.0–36.0)
MCV: 90.9 fl (ref 78.0–100.0)
Platelets: 281 K/uL (ref 150.0–400.0)
RBC: 4.82 Mil/uL (ref 4.22–5.81)
RDW: 13.4 % (ref 11.5–15.5)
WBC: 9.7 K/uL (ref 4.0–10.5)

## 2024-06-09 LAB — COMPREHENSIVE METABOLIC PANEL WITH GFR
ALT: 17 U/L (ref 0–53)
AST: 18 U/L (ref 0–37)
Albumin: 4.3 g/dL (ref 3.5–5.2)
Alkaline Phosphatase: 111 U/L (ref 39–117)
BUN: 11 mg/dL (ref 6–23)
CO2: 30 meq/L (ref 19–32)
Calcium: 9.5 mg/dL (ref 8.4–10.5)
Chloride: 103 meq/L (ref 96–112)
Creatinine, Ser: 1.3 mg/dL (ref 0.40–1.50)
GFR: 53.79 mL/min — ABNORMAL LOW (ref >60.00)
Glucose, Bld: 95 mg/dL (ref 70–99)
Potassium: 4 meq/L (ref 3.5–5.1)
Sodium: 141 meq/L (ref 135–145)
Total Bilirubin: 0.6 mg/dL (ref 0.2–1.2)
Total Protein: 6.9 g/dL (ref 6.0–8.3)

## 2024-06-09 LAB — VITAMIN B12: Vitamin B-12: 314 pg/mL (ref 211–911)

## 2024-06-09 LAB — HEMOGLOBIN A1C: Hgb A1c MFr Bld: 6.1 % (ref 4.6–6.5)

## 2024-06-09 LAB — TSH: TSH: 1.82 u[IU]/mL (ref 0.35–5.50)

## 2024-06-09 NOTE — Assessment & Plan Note (Signed)
 Overdue for CT scan, discussed with patient today. Will send notification to lung cancer screening program team

## 2024-06-09 NOTE — Assessment & Plan Note (Signed)
 Reviewed CT chest from August 2024 with patient today. Ultrasound thyroid  ordered and pending

## 2024-06-09 NOTE — Assessment & Plan Note (Signed)
 Following with vascular surgery.  Office notes reviewed from October 2025.  Continue apixaban  5 mg twice daily. Increase physical activity.  Work on lipid control.

## 2024-06-09 NOTE — Assessment & Plan Note (Signed)
 Historically uncontrolled.  Continue Zetia  10 mg daily.  Repeat lipid panel pending.

## 2024-06-09 NOTE — Assessment & Plan Note (Signed)
Stable.  Continue Nexium as needed.

## 2024-06-09 NOTE — Assessment & Plan Note (Addendum)
 Differentials include metabolic cause such as B12 deficiency, anemia, thyroid  disease; depression, COPD, sleep apnea.  Checking labs today including CBC, CMP, TSH, A1c.  Ultrasound thyroid  ordered and pending.  Offered treatment for depression today for which he kindly declines.  He will update.

## 2024-06-09 NOTE — Assessment & Plan Note (Signed)
 Repeat A1c pending

## 2024-06-09 NOTE — Assessment & Plan Note (Signed)
 Overall seems stable.  Discussed to increase Symbicort  to 2 puffs twice daily to see if this helps with fatigue. Continue albuterol  inhaler as needed

## 2024-06-09 NOTE — Assessment & Plan Note (Signed)
Controlled.   Continue losartan 50 mg daily. CMP pending. 

## 2024-06-09 NOTE — Progress Notes (Signed)
 Subjective:    Patient ID: Nathaniel [name], male    DOB: 08-Dec-1950, 73 y.o.   MRN: [id number]  Nathaniel [name] is a very pleasant 73 y.o. male with a history of atherosclerosis with claudication, ischemia of left lower extremity, hypertension, emphysema, tobacco use, tremors, prediabetes, hyperlipidemia who presents today for follow-up of chronic conditions.  1) PAD: Follows with vascular services, last office visit was October 2025.  He is managed on apixaban  5 mg BID. ABIs completed during his last visit, stable.  There were no changes made to his regimen.   Today he is having vivid dreams.   2) Hyperlipidemia/Hypertension: Following with cardiology, last office it was 05/04/2024.  He is currently managed on Zetia  10 mg daily which was resumed a few weeks ago.  He has failed 3 statin therapies.  He has also declined Leqvio. He is also managed on losartan  50 mg daily.   Today he discusses that he is taking Zetia  10 mg daily without myalgia. He denies chest pain, dizziness.   BP Readings from Last 3 Encounters:  06/09/24 136/84  05/04/24 120/80  04/28/24 (!) 166/95     3) COPD: Currently managed on Symbicort  160-4.17mcg 2 puffs twice daily, albuterol  inhaler as needed.  He is also a participant in the lung cancer screening program with last scan in August 2024. Within this scan he was noted to have bilateral thyroid  nodules including 1.8 cm hypodense nodule to the left thyroid .  Ultrasound was recommended.  Otherwise there are no acute changes.  Today he discusses infrequent use of albuterol . He is taking 1 puff BID of Symbicort . He remains fatigue and is largely inactive. He spends most of his day sitting at the computer. He does walk 1/3 mile daily which causes lower extremity pain. He requires 2 naps per day due to his fatigue. He denies snoring at night, but he wakes up around 2 am and then again around 5 am.    Review of Systems  Constitutional:  Positive for fatigue.   Cardiovascular:  Negative for chest pain.  Gastrointestinal:  Negative for constipation and diarrhea.  Skin:  Negative for color change.  Neurological:  Negative for dizziness.  Psychiatric/Behavioral:  Positive for sleep disturbance.        Feeling depressed          Past Medical History:  Diagnosis Date   Cataract    Chicken pox    Depression 07/23/21   cannot keep up energy needed to maintain lawn and bees   GERD (gastroesophageal reflux disease)    Hyperlipidemia    PAD (peripheral artery disease)    Pain in both lower extremities 11/23/2016   Peripheral vascular disease    Pre-diabetes    Seizures (HCC)    None since age 73   Skin abnormalities 11/16/2014    Social History   Socioeconomic History   Marital status: Married    Spouse name: Not on file   Number of children: Not on file   Years of education: Not on file   Highest education level: Some college, no degree  Occupational History   Not on file  Tobacco Use   Smoking status: Former    Current packs/day: 0.00    Average packs/day: 1 pack/day for 50.0 years (50.0 ttl pk-yrs)    Types: Cigarettes    Start date: 11/20/1969    Quit date: 11/21/2019    Years since quitting: 4.5   Smokeless tobacco: Never  Vaping Use  Vaping status: Never Used  Substance and Sexual Activity   Alcohol use: Not Currently    Alcohol/week: 0.0 standard drinks of alcohol   Drug use: No   Sexual activity: Not on file  Other Topics Concern   Not on file  Social History Narrative   Retired.   Stays busy with household work, programmer, applications.   Married for 3 years.   Grown children.   Enjoys reading.    Social Drivers of Corporate Investment Banker Strain: Low Risk  (06/08/2024)   Overall Financial Resource Strain (CARDIA)    Difficulty of Paying Living Expenses: Not hard at all  Food Insecurity: No Food Insecurity (06/08/2024)   Hunger Vital Sign    Worried About Running Out of Food in the Last Year: Never true    Ran  Out of Food in the Last Year: Never true  Transportation Needs: No Transportation Needs (06/08/2024)   PRAPARE - Administrator, Civil Service (Medical): No    Lack of Transportation (Non-Medical): No  Physical Activity: Insufficiently Active (06/08/2024)   Exercise Vital Sign    Days of Exercise per Week: 7 days    Minutes of Exercise per Session: 20 min  Stress: No Stress Concern Present (06/08/2024)   [employer] of Occupational Health - Occupational Stress Questionnaire    Feeling of Stress: Only a little  Social Connections: Moderately Isolated (06/08/2024)   Social Connection and Isolation Panel    Frequency of Communication with Friends and Family: Once a week    Frequency of Social Gatherings with Friends and Family: Once a week    Attends Religious Services: Never    Database Administrator or Organizations: Yes    Attends Engineer, Structural: More than 4 times per year    Marital Status: Married  Catering Manager Violence: Not At Risk (11/11/2023)   Humiliation, Afraid, Rape, and Kick questionnaire    Fear of Current or Ex-Partner: No    Emotionally Abused: No    Physically Abused: No    Sexually Abused: No    Past Surgical History:  Procedure Laterality Date   BACK SURGERY     COLONOSCOPY WITH PROPOFOL  N/A 02/07/2021   Procedure: COLONOSCOPY WITH PROPOFOL ;  Surgeon: Therisa Bi, MD;  Location: [facility] ENDOSCOPY;  Service: Gastroenterology;  Laterality: N/A;   LAMINECTOMY  1987   LOWER EXTREMITY ANGIOGRAPHY Left 05/02/2017   Procedure: Lower Extremity Angiography;  Surgeon: Marea Selinda RAMAN, MD;  Location: [facility] INVASIVE CV LAB;  Service: Cardiovascular;  Laterality: Left;   LOWER EXTREMITY ANGIOGRAPHY Left 11/16/2019   Procedure: LOWER EXTREMITY ANGIOGRAPHY;  Surgeon: Marea Selinda RAMAN, MD;  Location: [facility] INV[facility] CV [facility];  Service: Cardiovascular;  Laterality: Left;   LOWER EXTREMITY ANGIOGRAPHY Left 04/11/2020   Procedure: LOWER EXTREMITY ANGIOGRAPHY;   Surgeon: Marea Selinda RAMAN, MD;  Location: [facility];  Service: Cardiovascular;  Laterality: Left;   LOWER EXTREMITY ANGIOGRAPHY Left 04/12/2020   Procedure: Lower Extremity Angiography;  Surgeon: Marea Selinda RAMAN, MD;  Location: [facility];  Service: Cardiovascular;  Laterality: Left;   LOWER EXTREMITY ANGIOGRAPHY Left 08/30/2021   Procedure: Lower Extremity Angiography;  Surgeon: Marea Selinda RAMAN, MD;  Location: [facility];  Service: Cardiovascular;  Laterality: Left;   LOWER EXTREMITY INTERVENTION Left 08/31/2021   Procedure: LOWER EXTREMITY INTERVENTION;  Surgeon: Marea Selinda RAMAN, MD;  Location: [facility] IN[facility];  Service: Cardiovascular;  Laterality: Left;   TONSILLECTOMY  TOOTH EXTRACTION  2023    Family History  Problem Relation Age of Onset   Heart disease Mother        Arrthymia    Stroke Father        Deceased   Dementia Father    Heart disease Brother        Myocardial infarction   Colon cancer Neg Hx     Allergies  Allergen Reactions   Fentanyl  Other (See Comments)    chills   Prozac [Fluoxetine Hcl] Other (See Comments)    Extreme depression   Statins Other (See Comments)    Flu like symptoms    Current Outpatient Medications on File Prior to Visit  Medication Sig Dispense Refill   acetaminophen  (TYLENOL ) 500 MG tablet Take 500 mg by mouth every 6 (six) hours as needed.     albuterol  (VENTOLIN  HFA) 108 (90 Base) MCG/ACT inhaler Inhale 2 puffs into the lungs every 6 (six) hours as needed for shortness of breath. 8 g 0   apixaban  (ELIQUIS ) 5 MG TABS tablet Take 1 tablet (5 mg total) by mouth 2 (two) times daily. 60 tablet 11   ASPIRIN  LOW DOSE 81 MG tablet TAKE 1 TABLET BY MOUTH EVERY DAY 90 tablet 3   budesonide -formoterol  (SYMBICORT ) 160-4.5 MCG/ACT inhaler TAKE 2 PUFFS BY MOUTH TWICE A DAY 30.6 each 0   doxylamine, Sleep, (UNISOM) 25 MG tablet Take 25 mg by mouth at bedtime as needed.     esomeprazole (NEXIUM) 20 MG capsule Take 20 mg by  mouth daily as needed (heartburn).      ezetimibe  (ZETIA ) 10 MG tablet Take 1 tablet (10 mg total) by mouth daily. 90 tablet 3   losartan  (COZAAR ) 50 MG tablet Take 1 tablet (50 mg total) by mouth daily. 90 tablet 3   Multiple Vitamins-Minerals (MULTIVITAMIN WITH MINERALS) tablet Take 1 tablet by mouth daily.     No current facility-administered medications on file prior to visit.    BP 136/84   Pulse 85   Temp 97.8 F (36.6 C) (Oral)   Ht 6' 2.25 (1.886 m)   Wt 225 lb 6 oz (102.2 kg)   SpO2 98%   BMI 28.74 kg/m  Objective:   Physical Exam Cardiovascular:     Rate and Rhythm: Normal rate and regular rhythm.  Pulmonary:     Effort: Pulmonary effort is normal.     Breath sounds: Normal breath sounds.  Abdominal:     General: Bowel sounds are normal.     Palpations: Abdomen is soft.     Tenderness: There is no abdominal tenderness.  Musculoskeletal:     Cervical back: Neck supple.  Skin:    General: Skin is warm and dry.  Neurological:     Mental Status: He is alert and oriented to person, place, and time.  Psychiatric:        Mood and Affect: Mood normal.     Physical Exam        Assessment & Plan:  Essential hypertension Assessment & Plan: Controlled.  Continue losartan  50 mg daily. CMP pending.  Orders: -     Comprehensive metabolic panel with GFR -     CBC  Low vitamin B12 level Assessment & Plan: Repeat B12 level pending.  Orders: -     Vitamin B12  Hyperlipidemia, unspecified hyperlipidemia type Assessment & Plan: Historically uncontrolled.  Continue Zetia  10 mg daily.  Repeat lipid panel pending.  Orders: -     Comprehensive metabolic  panel with GFR  Prediabetes Assessment & Plan: Repeat A1c pending.  Orders: -     Hemoglobin A1c  Multiple thyroid  nodules Assessment & Plan: Reviewed CT chest from August 2024 with patient today. Ultrasound thyroid  ordered and pending  Orders: -     US  THYROID ; Future -     TSH  Screening for  prostate cancer -     PSA, Medicare  Chronic fatigue Assessment & Plan: Differentials include metabolic cause such as B12 deficiency, anemia, thyroid  disease; depression, COPD, sleep apnea.  Checking labs today including CBC, CMP, TSH, A1c.  Ultrasound thyroid  ordered and pending.  Offered treatment for depression today for which he kindly declines.  He will update.   Ischemia of left lower extremity Assessment & Plan: Following with vascular surgery.  Office notes reviewed from October 2025.  Continue apixaban  5 mg twice daily. Increase physical activity.  Work on lipid control.   Atherosclerosis of native artery of left lower extremity with intermittent claudication Assessment & Plan: Following with vascular surgery.  Office notes reviewed from October 2025.  Continue apixaban  5 mg twice daily. Increase physical activity.  Work on lipid control.   Centrilobular emphysema (HCC) Assessment & Plan: Overall seems stable.  Discussed to increase Symbicort  to 2 puffs twice daily to see if this helps with fatigue. Continue albuterol  inhaler as needed   Gastroesophageal reflux disease, unspecified whether esophagitis present Assessment & Plan: Stable.  Continue Nexium as needed   Tobacco abuse Assessment & Plan: Overdue for CT scan, discussed with patient today. Will send notification to lung cancer screening program team     Assessment and Plan Assessment & Plan         Comer MARLA Gaskins, NP     History of Present Illness

## 2024-06-09 NOTE — Patient Instructions (Addendum)
 Stop by the lab prior to leaving today. I will notify you of your results once received.   Try to avoid naps during the day if possible.  Increase physical activity.   It was a pleasure to see you today!

## 2024-06-09 NOTE — Assessment & Plan Note (Signed)
 Repeat B 12 level pending.

## 2024-06-10 ENCOUNTER — Ambulatory Visit: Payer: Self-pay | Admitting: Primary Care

## 2024-06-30 DIAGNOSIS — M62830 Muscle spasm of back: Secondary | ICD-10-CM | POA: Diagnosis not present

## 2024-06-30 DIAGNOSIS — M9903 Segmental and somatic dysfunction of lumbar region: Secondary | ICD-10-CM | POA: Diagnosis not present

## 2024-06-30 DIAGNOSIS — M9905 Segmental and somatic dysfunction of pelvic region: Secondary | ICD-10-CM | POA: Diagnosis not present

## 2024-06-30 DIAGNOSIS — M5136 Other intervertebral disc degeneration, lumbar region with discogenic back pain only: Secondary | ICD-10-CM | POA: Diagnosis not present

## 2024-10-27 ENCOUNTER — Encounter (INDEPENDENT_AMBULATORY_CARE_PROVIDER_SITE_OTHER)

## 2024-10-27 ENCOUNTER — Ambulatory Visit (INDEPENDENT_AMBULATORY_CARE_PROVIDER_SITE_OTHER): Admitting: Vascular Surgery

## 2024-11-11 ENCOUNTER — Ambulatory Visit
